# Patient Record
Sex: Female | Born: 1935 | Race: Black or African American | Hispanic: No | Smoking: Never smoker
Health system: Southern US, Community
[De-identification: ages and names within clinical notes are randomized; demographics above are authoritative.]

## PROBLEM LIST (undated history)

## (undated) DIAGNOSIS — F0280 Dementia in other diseases classified elsewhere without behavioral disturbance: Secondary | ICD-10-CM

## (undated) DIAGNOSIS — G309 Alzheimer's disease, unspecified: Secondary | ICD-10-CM

## (undated) DIAGNOSIS — D509 Iron deficiency anemia, unspecified: Secondary | ICD-10-CM

## (undated) DIAGNOSIS — N390 Urinary tract infection, site not specified: Secondary | ICD-10-CM

## (undated) DIAGNOSIS — I10 Essential (primary) hypertension: Secondary | ICD-10-CM

## (undated) DIAGNOSIS — K56609 Unspecified intestinal obstruction, unspecified as to partial versus complete obstruction: Secondary | ICD-10-CM

---

## 2007-11-23 ENCOUNTER — Ambulatory Visit: Payer: Self-pay | Admitting: Cardiovascular Disease

## 2007-11-23 ENCOUNTER — Inpatient Hospital Stay (HOSPITAL_COMMUNITY): Admission: EM | Admit: 2007-11-23 | Discharge: 2007-12-04 | Payer: Self-pay | Admitting: Emergency Medicine

## 2007-11-30 ENCOUNTER — Encounter: Payer: Self-pay | Admitting: Cardiovascular Disease

## 2008-01-07 ENCOUNTER — Inpatient Hospital Stay (HOSPITAL_COMMUNITY): Admission: EM | Admit: 2008-01-07 | Discharge: 2008-01-15 | Payer: Self-pay | Admitting: Emergency Medicine

## 2008-01-16 ENCOUNTER — Ambulatory Visit: Payer: Self-pay | Admitting: Family Medicine

## 2008-06-27 IMAGING — CR DG ABDOMEN ACUTE W/ 1V CHEST
3 series · 3 of 3 positions shown · non-contrast
Comparison: 11/23/2007.

CLINICAL DATA: Abdomen and chest pain.  Dehydration.  Altered
mental status.

ACUTE ABDOMEN SERIES (ABDOMEN 2 VIEW & CHEST 1 VIEW)

[view not recorded (1 of 3)]
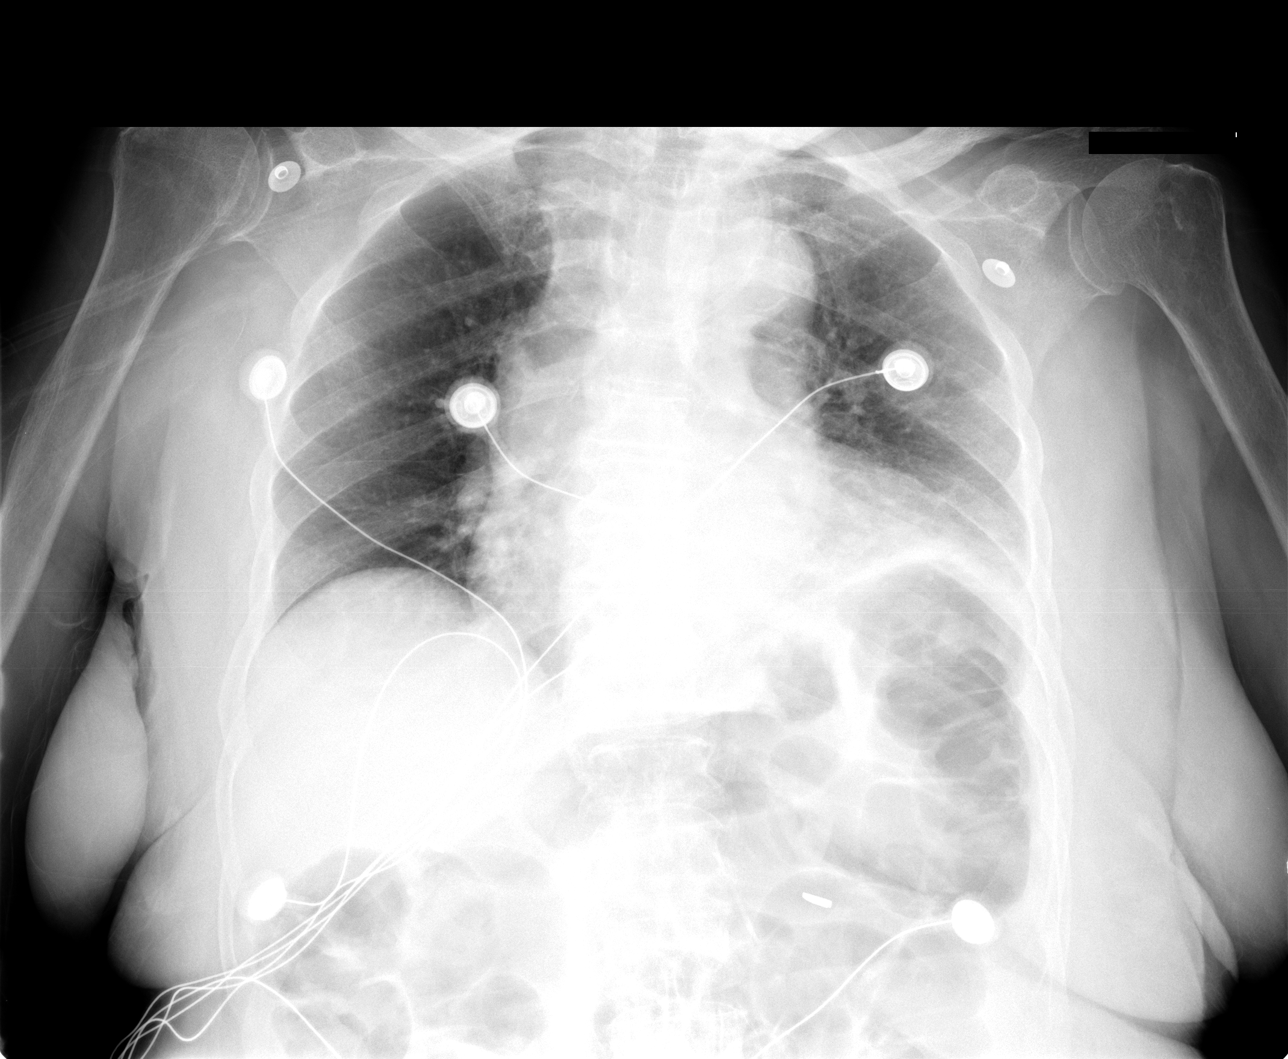

[view not recorded (2 of 3)]
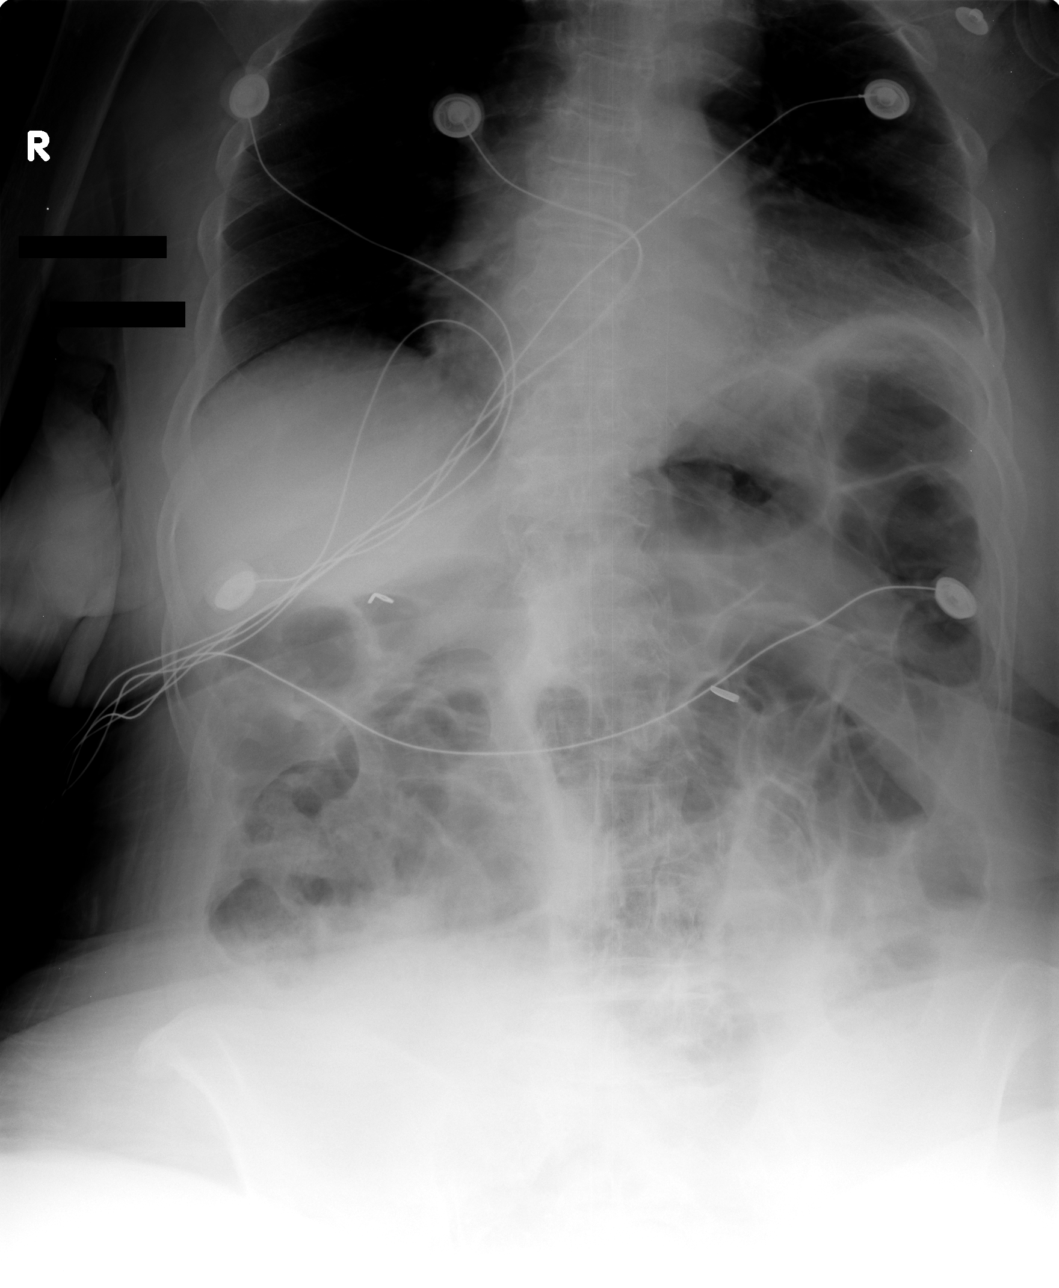

[view not recorded (3 of 3)]
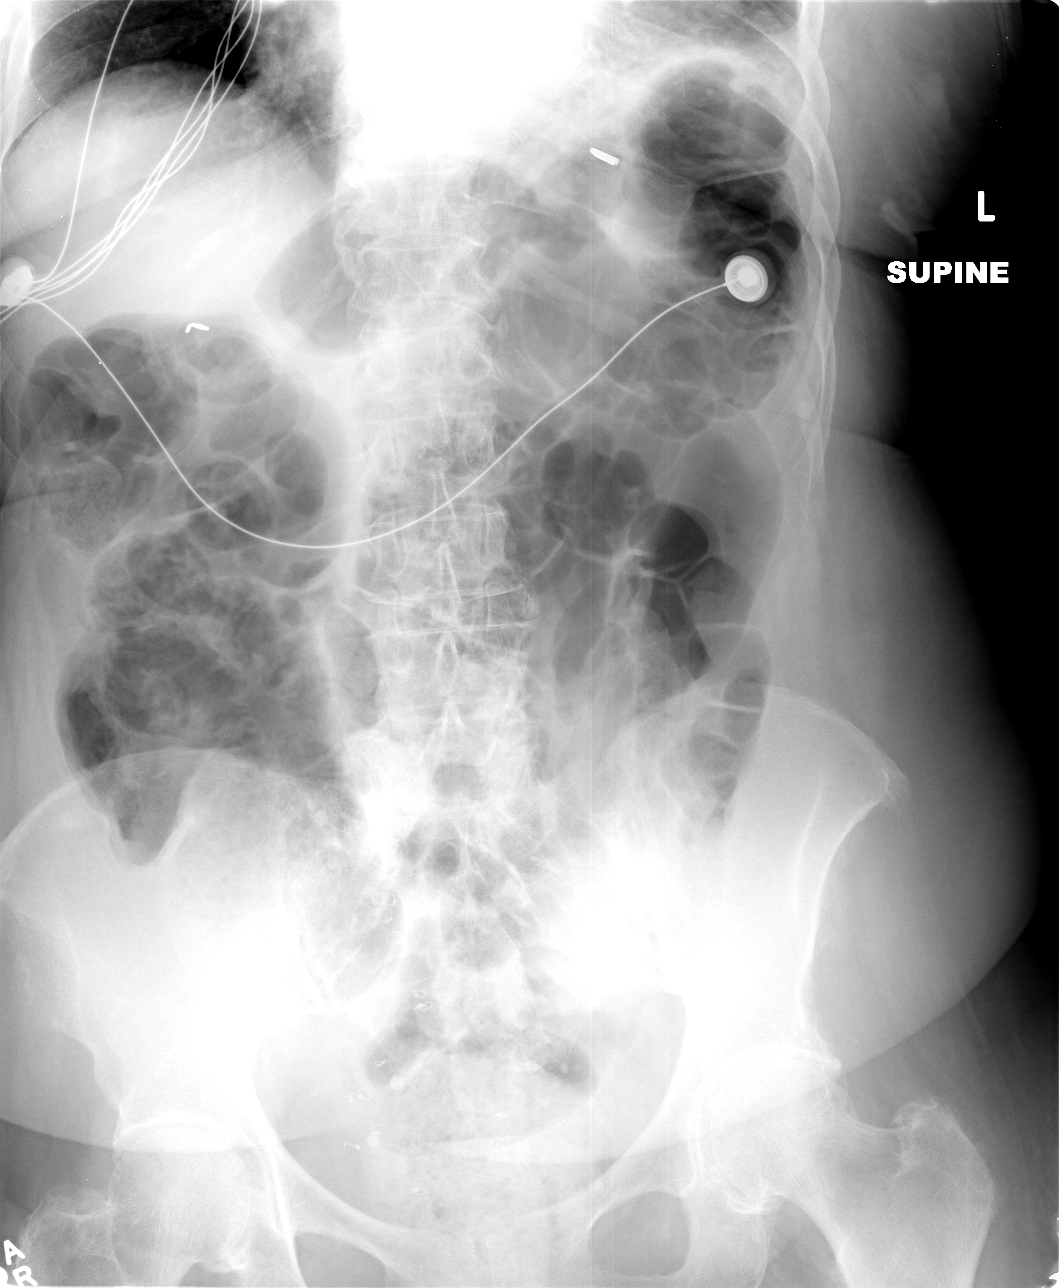

[3 of 3 positions shown; findings below may reference images not displayed]

FINDINGS: Very poor inspiration with mild left basilar airspace
opacity.  No gross change in borderline enlargement of the cardiac
silhouette and tortuosity of the aorta.  Normal bowel gas pattern
without free peritoneal air.  Interval mildly elevated left
hemidiaphragm.  Diffuse osteopenia.  Thoracolumbar spine
degenerative changes and mild scoliosis.  Bilateral upper abdominal
surgical clips and right pelvic surgical sutures.
IMPRESSION: 1.  Very poor inspiration with mild left basilar atelectasis.
2.  No acute abdominal abnormality.
3.  Stable borderline cardiomegaly.

## 2009-03-03 ENCOUNTER — Ambulatory Visit (HOSPITAL_BASED_OUTPATIENT_CLINIC_OR_DEPARTMENT_OTHER): Admission: RE | Admit: 2009-03-03 | Discharge: 2009-03-03 | Payer: Self-pay | Admitting: Internal Medicine

## 2009-03-03 ENCOUNTER — Ambulatory Visit: Payer: Self-pay | Admitting: Interventional Radiology

## 2010-07-28 ENCOUNTER — Inpatient Hospital Stay (HOSPITAL_COMMUNITY)
Admission: EM | Admit: 2010-07-28 | Discharge: 2010-08-09 | Disposition: A | Discharge status: Other Acute Inpatient Hospital | Payer: Self-pay | Source: Home / Self Care | Attending: Internal Medicine | Admitting: Internal Medicine

## 2010-07-30 ENCOUNTER — Encounter (INDEPENDENT_AMBULATORY_CARE_PROVIDER_SITE_OTHER): Payer: Self-pay | Admitting: Pulmonary Disease

## 2010-07-30 HISTORY — PX: OTHER SURGICAL HISTORY: SHX169

## 2010-08-02 LAB — POCT I-STAT 3, ART BLOOD GAS (G3+)
Acid-base deficit: 2 mmol/L (ref 0.0–2.0)
Acid-base deficit: 1 mmol/L (ref 0.0–2.0)
Bicarbonate: 21.8 mEq/L (ref 20.0–24.0)
Bicarbonate: 24.1 mEq/L — ABNORMAL HIGH (ref 20.0–24.0)
O2 Saturation: 99 %
O2 Saturation: 99 %
Patient temperature: 98.8
TCO2: 23 mmol/L (ref 0–100)
TCO2: 25 mmol/L (ref 0–100)
pCO2 arterial: 32.5 mmHg — ABNORMAL LOW (ref 35.0–45.0)
pCO2 arterial: 38.6 mmHg (ref 35.0–45.0)
pH, Arterial: 7.434 — ABNORMAL HIGH (ref 7.350–7.400)
pH, Arterial: 7.404 — ABNORMAL HIGH (ref 7.350–7.400)
pO2, Arterial: 154 mmHg — ABNORMAL HIGH (ref 80.0–100.0)
pO2, Arterial: 148 mmHg — ABNORMAL HIGH (ref 80.0–100.0)

## 2010-08-02 LAB — CBC
HCT: 46 % (ref 36.0–46.0)
HCT: 40.3 % (ref 36.0–46.0)
HCT: 37 % (ref 36.0–46.0)
HCT: 27.1 % — ABNORMAL LOW (ref 36.0–46.0)
HCT: 23.6 % — ABNORMAL LOW (ref 36.0–46.0)
HCT: 21.7 % — ABNORMAL LOW (ref 36.0–46.0)
HCT: 23.4 % — ABNORMAL LOW (ref 36.0–46.0)
HCT: 22.3 % — ABNORMAL LOW (ref 36.0–46.0)
Hemoglobin: 15.5 g/dL — ABNORMAL HIGH (ref 12.0–15.0)
Hemoglobin: 13.6 g/dL (ref 12.0–15.0)
Hemoglobin: 12.4 g/dL (ref 12.0–15.0)
Hemoglobin: 9.2 g/dL — ABNORMAL LOW (ref 12.0–15.0)
Hemoglobin: 7.9 g/dL — ABNORMAL LOW (ref 12.0–15.0)
Hemoglobin: 7.5 g/dL — ABNORMAL LOW (ref 12.0–15.0)
Hemoglobin: 8 g/dL — ABNORMAL LOW (ref 12.0–15.0)
Hemoglobin: 7.8 g/dL — ABNORMAL LOW (ref 12.0–15.0)
MCH: 27.4 pg (ref 26.0–34.0)
MCH: 27.3 pg (ref 26.0–34.0)
MCH: 26.9 pg (ref 26.0–34.0)
MCH: 26.8 pg (ref 26.0–34.0)
MCH: 26.8 pg (ref 26.0–34.0)
MCH: 28.4 pg (ref 26.0–34.0)
MCH: 28 pg (ref 26.0–34.0)
MCH: 28.4 pg (ref 26.0–34.0)
MCHC: 33.7 g/dL (ref 30.0–36.0)
MCHC: 33.7 g/dL (ref 30.0–36.0)
MCHC: 33.5 g/dL (ref 30.0–36.0)
MCHC: 33.9 g/dL (ref 30.0–36.0)
MCHC: 33.5 g/dL (ref 30.0–36.0)
MCHC: 34.6 g/dL (ref 30.0–36.0)
MCHC: 34.2 g/dL (ref 30.0–36.0)
MCHC: 35 g/dL (ref 30.0–36.0)
MCV: 81.4 fL (ref 78.0–100.0)
MCV: 80.8 fL (ref 78.0–100.0)
MCV: 80.3 fL (ref 78.0–100.0)
MCV: 79 fL (ref 78.0–100.0)
MCV: 80 fL (ref 78.0–100.0)
MCV: 82.2 fL (ref 78.0–100.0)
MCV: 81.8 fL (ref 78.0–100.0)
MCV: 81.1 fL (ref 78.0–100.0)
Platelets: 271 10*3/uL (ref 150–400)
Platelets: 200 10*3/uL (ref 150–400)
Platelets: 199 10*3/uL (ref 150–400)
Platelets: 176 10*3/uL (ref 150–400)
Platelets: 132 10*3/uL — ABNORMAL LOW (ref 150–400)
Platelets: 105 10*3/uL — ABNORMAL LOW (ref 150–400)
Platelets: 126 10*3/uL — ABNORMAL LOW (ref 150–400)
Platelets: 130 10*3/uL — ABNORMAL LOW (ref 150–400)
RBC: 5.65 MIL/uL — ABNORMAL HIGH (ref 3.87–5.11)
RBC: 4.99 MIL/uL (ref 3.87–5.11)
RBC: 4.61 MIL/uL (ref 3.87–5.11)
RBC: 3.43 MIL/uL — ABNORMAL LOW (ref 3.87–5.11)
RBC: 2.95 MIL/uL — ABNORMAL LOW (ref 3.87–5.11)
RBC: 2.64 MIL/uL — ABNORMAL LOW (ref 3.87–5.11)
RBC: 2.86 MIL/uL — ABNORMAL LOW (ref 3.87–5.11)
RBC: 2.75 MIL/uL — ABNORMAL LOW (ref 3.87–5.11)
RDW: 13.2 % (ref 11.5–15.5)
RDW: 13.5 % (ref 11.5–15.5)
RDW: 13.3 % (ref 11.5–15.5)
RDW: 13.1 % (ref 11.5–15.5)
RDW: 13.2 % (ref 11.5–15.5)
RDW: 13.6 % (ref 11.5–15.5)
RDW: 13.6 % (ref 11.5–15.5)
RDW: 14 % (ref 11.5–15.5)
WBC: 15.5 10*3/uL — ABNORMAL HIGH (ref 4.0–10.5)
WBC: 15.7 10*3/uL — ABNORMAL HIGH (ref 4.0–10.5)
WBC: 18.3 10*3/uL — ABNORMAL HIGH (ref 4.0–10.5)
WBC: 5.8 10*3/uL (ref 4.0–10.5)
WBC: 5.4 10*3/uL (ref 4.0–10.5)
WBC: 6.8 10*3/uL (ref 4.0–10.5)
WBC: 7.6 10*3/uL (ref 4.0–10.5)
WBC: 7.7 10*3/uL (ref 4.0–10.5)

## 2010-08-02 LAB — COMPREHENSIVE METABOLIC PANEL
ALT: 9 U/L (ref 0–35)
ALT: 9 U/L (ref 0–35)
ALT: 10 U/L (ref 0–35)
ALT: 13 U/L (ref 0–35)
AST: 24 U/L (ref 0–37)
AST: 26 U/L (ref 0–37)
AST: 26 U/L (ref 0–37)
AST: 28 U/L (ref 0–37)
Albumin: 2.1 g/dL — ABNORMAL LOW (ref 3.5–5.2)
Albumin: 2.3 g/dL — ABNORMAL LOW (ref 3.5–5.2)
Albumin: 2.1 g/dL — ABNORMAL LOW (ref 3.5–5.2)
Albumin: 2.6 g/dL — ABNORMAL LOW (ref 3.5–5.2)
Alkaline Phosphatase: 34 U/L — ABNORMAL LOW (ref 39–117)
Alkaline Phosphatase: 24 U/L — ABNORMAL LOW (ref 39–117)
Alkaline Phosphatase: 30 U/L — ABNORMAL LOW (ref 39–117)
Alkaline Phosphatase: 45 U/L (ref 39–117)
BUN: 36 mg/dL — ABNORMAL HIGH (ref 6–23)
BUN: 39 mg/dL — ABNORMAL HIGH (ref 6–23)
BUN: 38 mg/dL — ABNORMAL HIGH (ref 6–23)
BUN: 29 mg/dL — ABNORMAL HIGH (ref 6–23)
CO2: 20 mEq/L (ref 19–32)
CO2: 25 mEq/L (ref 19–32)
CO2: 25 mEq/L (ref 19–32)
CO2: 24 mEq/L (ref 19–32)
Calcium: 7.7 mg/dL — ABNORMAL LOW (ref 8.4–10.5)
Calcium: 7.4 mg/dL — ABNORMAL LOW (ref 8.4–10.5)
Calcium: 7.4 mg/dL — ABNORMAL LOW (ref 8.4–10.5)
Calcium: 7.7 mg/dL — ABNORMAL LOW (ref 8.4–10.5)
Chloride: 111 mEq/L (ref 96–112)
Chloride: 113 mEq/L — ABNORMAL HIGH (ref 96–112)
Chloride: 113 mEq/L — ABNORMAL HIGH (ref 96–112)
Chloride: 114 mEq/L — ABNORMAL HIGH (ref 96–112)
Creatinine, Ser: 1.81 mg/dL — ABNORMAL HIGH (ref 0.4–1.2)
Creatinine, Ser: 2 mg/dL — ABNORMAL HIGH (ref 0.4–1.2)
Creatinine, Ser: 2.09 mg/dL — ABNORMAL HIGH (ref 0.4–1.2)
Creatinine, Ser: 1.72 mg/dL — ABNORMAL HIGH (ref 0.4–1.2)
GFR calc Af Amer: 32 mL/min — ABNORMAL LOW (ref >60)
GFR calc Af Amer: 29 mL/min — ABNORMAL LOW (ref >60)
GFR calc Af Amer: 27 mL/min — ABNORMAL LOW (ref >60)
GFR calc Af Amer: 34 mL/min — ABNORMAL LOW (ref >60)
GFR calc non Af Amer: 27 mL/min — ABNORMAL LOW (ref >60)
GFR calc non Af Amer: 24 mL/min — ABNORMAL LOW (ref >60)
GFR calc non Af Amer: 23 mL/min — ABNORMAL LOW (ref >60)
GFR calc non Af Amer: 28 mL/min — ABNORMAL LOW (ref >60)
Glucose, Bld: 113 mg/dL — ABNORMAL HIGH (ref 70–99)
Glucose, Bld: 100 mg/dL — ABNORMAL HIGH (ref 70–99)
Glucose, Bld: 206 mg/dL — ABNORMAL HIGH (ref 70–99)
Glucose, Bld: 78 mg/dL (ref 70–99)
Potassium: 4 mEq/L (ref 3.5–5.1)
Potassium: 4.7 mEq/L (ref 3.5–5.1)
Potassium: 4.4 mEq/L (ref 3.5–5.1)
Potassium: 3.8 mEq/L (ref 3.5–5.1)
Sodium: 138 mEq/L (ref 135–145)
Sodium: 144 mEq/L (ref 135–145)
Sodium: 142 mEq/L (ref 135–145)
Sodium: 142 mEq/L (ref 135–145)
Total Bilirubin: 0.9 mg/dL (ref 0.3–1.2)
Total Bilirubin: 1.1 mg/dL (ref 0.3–1.2)
Total Bilirubin: 0.7 mg/dL (ref 0.3–1.2)
Total Bilirubin: 1.2 mg/dL (ref 0.3–1.2)
Total Protein: 3.9 g/dL — ABNORMAL LOW (ref 6.0–8.3)
Total Protein: 3.9 g/dL — ABNORMAL LOW (ref 6.0–8.3)
Total Protein: 3.6 g/dL — ABNORMAL LOW (ref 6.0–8.3)
Total Protein: 4.5 g/dL — ABNORMAL LOW (ref 6.0–8.3)

## 2010-08-02 LAB — DIFFERENTIAL
Basophils Absolute: 0 10*3/uL (ref 0.0–0.1)
Basophils Absolute: 0 10*3/uL (ref 0.0–0.1)
Basophils Relative: 0 % (ref 0–1)
Basophils Relative: 0 % (ref 0–1)
Eosinophils Absolute: 0 10*3/uL (ref 0.0–0.7)
Eosinophils Absolute: 0 10*3/uL (ref 0.0–0.7)
Eosinophils Relative: 0 % (ref 0–5)
Eosinophils Relative: 0 % (ref 0–5)
Lymphocytes Relative: 3 % — ABNORMAL LOW (ref 12–46)
Lymphocytes Relative: 4 % — ABNORMAL LOW (ref 12–46)
Lymphs Abs: 0.5 10*3/uL — ABNORMAL LOW (ref 0.7–4.0)
Lymphs Abs: 0.2 10*3/uL — ABNORMAL LOW (ref 0.7–4.0)
Monocytes Absolute: 1.6 10*3/uL — ABNORMAL HIGH (ref 0.1–1.0)
Monocytes Absolute: 0.5 10*3/uL (ref 0.1–1.0)
Monocytes Relative: 11 % (ref 3–12)
Monocytes Relative: 9 % (ref 3–12)
Neutro Abs: 13.4 10*3/uL — ABNORMAL HIGH (ref 1.7–7.7)
Neutro Abs: 4.7 10*3/uL (ref 1.7–7.7)
Neutrophils Relative %: 86 % — ABNORMAL HIGH (ref 43–77)
Neutrophils Relative %: 87 % — ABNORMAL HIGH (ref 43–77)
WBC Morphology: INCREASED

## 2010-08-02 LAB — CROSSMATCH
ABO/RH(D): O POS
Antibody Screen: NEGATIVE
Unit division: 0
Unit division: 0

## 2010-08-02 LAB — HEPATIC FUNCTION PANEL
ALT: 12 U/L (ref 0–35)
AST: 42 U/L — ABNORMAL HIGH (ref 0–37)
Albumin: 4 g/dL (ref 3.5–5.2)
Alkaline Phosphatase: 63 U/L (ref 39–117)
Bilirubin, Direct: 0.2 mg/dL (ref 0.0–0.3)
Indirect Bilirubin: 0.8 mg/dL (ref 0.3–0.9)
Total Bilirubin: 1 mg/dL (ref 0.3–1.2)
Total Protein: 8 g/dL (ref 6.0–8.3)

## 2010-08-02 LAB — GLUCOSE, CAPILLARY
Glucose-Capillary: 104 mg/dL — ABNORMAL HIGH (ref 70–99)
Glucose-Capillary: 106 mg/dL — ABNORMAL HIGH (ref 70–99)
Glucose-Capillary: 131 mg/dL — ABNORMAL HIGH (ref 70–99)
Glucose-Capillary: 155 mg/dL — ABNORMAL HIGH (ref 70–99)
Glucose-Capillary: 231 mg/dL — ABNORMAL HIGH (ref 70–99)
Glucose-Capillary: 90 mg/dL (ref 70–99)
Glucose-Capillary: 98 mg/dL (ref 70–99)
Glucose-Capillary: 100 mg/dL — ABNORMAL HIGH (ref 70–99)
Glucose-Capillary: 108 mg/dL — ABNORMAL HIGH (ref 70–99)
Glucose-Capillary: 108 mg/dL — ABNORMAL HIGH (ref 70–99)
Glucose-Capillary: 60 mg/dL — ABNORMAL LOW (ref 70–99)
Glucose-Capillary: 60 mg/dL — ABNORMAL LOW (ref 70–99)
Glucose-Capillary: 65 mg/dL — ABNORMAL LOW (ref 70–99)
Glucose-Capillary: 66 mg/dL — ABNORMAL LOW (ref 70–99)
Glucose-Capillary: 67 mg/dL — ABNORMAL LOW (ref 70–99)
Glucose-Capillary: 69 mg/dL — ABNORMAL LOW (ref 70–99)
Glucose-Capillary: 69 mg/dL — ABNORMAL LOW (ref 70–99)
Glucose-Capillary: 70 mg/dL (ref 70–99)
Glucose-Capillary: 74 mg/dL (ref 70–99)
Glucose-Capillary: 78 mg/dL (ref 70–99)
Glucose-Capillary: 86 mg/dL (ref 70–99)
Glucose-Capillary: 86 mg/dL (ref 70–99)
Glucose-Capillary: 88 mg/dL (ref 70–99)
Glucose-Capillary: 88 mg/dL (ref 70–99)
Glucose-Capillary: 88 mg/dL (ref 70–99)
Glucose-Capillary: 103 mg/dL — ABNORMAL HIGH (ref 70–99)
Glucose-Capillary: 64 mg/dL — ABNORMAL LOW (ref 70–99)
Glucose-Capillary: 80 mg/dL (ref 70–99)
Glucose-Capillary: 81 mg/dL (ref 70–99)
Glucose-Capillary: 82 mg/dL (ref 70–99)
Glucose-Capillary: 82 mg/dL (ref 70–99)
Glucose-Capillary: 84 mg/dL (ref 70–99)
Glucose-Capillary: 87 mg/dL (ref 70–99)

## 2010-08-02 LAB — PREPARE FRESH FROZEN PLASMA
Unit division: 0
Unit division: 0

## 2010-08-02 LAB — BLOOD GAS, ARTERIAL
Acid-base deficit: 0.4 mmol/L (ref 0.0–2.0)
Bicarbonate: 22.2 mEq/L (ref 20.0–24.0)
Drawn by: 246861
FIO2: 0.5 %
MECHVT: 500 mL
O2 Saturation: 99.4 %
PEEP: 5 cmH2O
Patient temperature: 98.6
RATE: 10 resp/min
TCO2: 23.1 mmol/L (ref 0–100)
pCO2 arterial: 27.3 mmHg — ABNORMAL LOW (ref 35.0–45.0)
pH, Arterial: 7.521 — ABNORMAL HIGH (ref 7.350–7.400)
pO2, Arterial: 127 mmHg — ABNORMAL HIGH (ref 80.0–100.0)

## 2010-08-02 LAB — BASIC METABOLIC PANEL
BUN: 27 mg/dL — ABNORMAL HIGH (ref 6–23)
BUN: 30 mg/dL — ABNORMAL HIGH (ref 6–23)
BUN: 39 mg/dL — ABNORMAL HIGH (ref 6–23)
CO2: 24 mEq/L (ref 19–32)
CO2: 29 mEq/L (ref 19–32)
CO2: 28 mEq/L (ref 19–32)
Calcium: 9.9 mg/dL (ref 8.4–10.5)
Calcium: 9 mg/dL (ref 8.4–10.5)
Calcium: 9.1 mg/dL (ref 8.4–10.5)
Chloride: 99 mEq/L (ref 96–112)
Chloride: 103 mEq/L (ref 96–112)
Chloride: 106 mEq/L (ref 96–112)
Creatinine, Ser: 1.55 mg/dL — ABNORMAL HIGH (ref 0.4–1.2)
Creatinine, Ser: 1.68 mg/dL — ABNORMAL HIGH (ref 0.4–1.2)
Creatinine, Ser: 1.8 mg/dL — ABNORMAL HIGH (ref 0.4–1.2)
GFR calc Af Amer: 39 mL/min — ABNORMAL LOW (ref >60)
GFR calc Af Amer: 35 mL/min — ABNORMAL LOW (ref >60)
GFR calc Af Amer: 33 mL/min — ABNORMAL LOW (ref >60)
GFR calc non Af Amer: 32 mL/min — ABNORMAL LOW (ref >60)
GFR calc non Af Amer: 29 mL/min — ABNORMAL LOW (ref >60)
GFR calc non Af Amer: 27 mL/min — ABNORMAL LOW (ref >60)
Glucose, Bld: 216 mg/dL — ABNORMAL HIGH (ref 70–99)
Glucose, Bld: 115 mg/dL — ABNORMAL HIGH (ref 70–99)
Glucose, Bld: 91 mg/dL (ref 70–99)
Potassium: 3.4 mEq/L — ABNORMAL LOW (ref 3.5–5.1)
Potassium: 4.6 mEq/L (ref 3.5–5.1)
Potassium: 4.6 mEq/L (ref 3.5–5.1)
Sodium: 140 mEq/L (ref 135–145)
Sodium: 141 mEq/L (ref 135–145)
Sodium: 143 mEq/L (ref 135–145)

## 2010-08-02 LAB — URINALYSIS, ROUTINE W REFLEX MICROSCOPIC
Bilirubin Urine: NEGATIVE
Ketones, ur: NEGATIVE mg/dL
Nitrite: NEGATIVE
Protein, ur: 100 mg/dL — AB
Specific Gravity, Urine: 1.016 (ref 1.005–1.030)
Urine Glucose, Fasting: NEGATIVE mg/dL
Urobilinogen, UA: 0.2 mg/dL (ref 0.0–1.0)
pH: 5.5 (ref 5.0–8.0)

## 2010-08-02 LAB — LACTIC ACID, PLASMA
Lactic Acid, Venous: 3.3 mmol/L — ABNORMAL HIGH (ref 0.5–2.2)
Lactic Acid, Venous: 5.9 mmol/L — ABNORMAL HIGH (ref 0.5–2.2)
Lactic Acid, Venous: 1.6 mmol/L (ref 0.5–2.2)

## 2010-08-02 LAB — HEMOGLOBIN AND HEMATOCRIT, BLOOD
HCT: 17.8 % — ABNORMAL LOW (ref 36.0–46.0)
Hemoglobin: 5.9 g/dL — CL (ref 12.0–15.0)

## 2010-08-02 LAB — ABO/RH: ABO/RH(D): O POS

## 2010-08-02 LAB — CULTURE, RESPIRATORY W GRAM STAIN

## 2010-08-02 LAB — URINE MICROSCOPIC-ADD ON

## 2010-08-02 LAB — PHOSPHORUS
Phosphorus: 2 mg/dL — ABNORMAL LOW (ref 2.3–4.6)
Phosphorus: 2.4 mg/dL (ref 2.3–4.6)

## 2010-08-02 LAB — T4, FREE: Free T4: 1.55 ng/dL (ref 0.80–1.80)

## 2010-08-02 LAB — PROTIME-INR
INR: 1.79 — ABNORMAL HIGH (ref 0.00–1.49)
INR: 1.31 (ref 0.00–1.49)
Prothrombin Time: 21 seconds — ABNORMAL HIGH (ref 11.6–15.2)
Prothrombin Time: 16.5 seconds — ABNORMAL HIGH (ref 11.6–15.2)

## 2010-08-02 LAB — MRSA PCR SCREENING: MRSA by PCR: NEGATIVE

## 2010-08-02 LAB — TSH: TSH: 4.968 u[IU]/mL — ABNORMAL HIGH (ref 0.350–4.500)

## 2010-08-02 LAB — MAGNESIUM
Magnesium: 1.9 mg/dL (ref 1.5–2.5)
Magnesium: 1.7 mg/dL (ref 1.5–2.5)

## 2010-08-02 LAB — APTT
aPTT: 52 seconds — ABNORMAL HIGH (ref 24–37)
aPTT: 46 seconds — ABNORMAL HIGH (ref 24–37)

## 2010-08-02 LAB — T3, FREE: T3, Free: 2.2 pg/mL — ABNORMAL LOW (ref 2.3–4.2)

## 2010-08-02 LAB — LIPASE, BLOOD: Lipase: 15 U/L (ref 11–59)

## 2010-08-04 LAB — CBC
HCT: 23.9 % — ABNORMAL LOW (ref 36.0–46.0)
HCT: 25.1 % — ABNORMAL LOW (ref 36.0–46.0)
Hemoglobin: 8.2 g/dL — ABNORMAL LOW (ref 12.0–15.0)
Hemoglobin: 8.7 g/dL — ABNORMAL LOW (ref 12.0–15.0)
MCH: 28 pg (ref 26.0–34.0)
MCH: 27.6 pg (ref 26.0–34.0)
MCHC: 34.3 g/dL (ref 30.0–36.0)
MCHC: 34.7 g/dL (ref 30.0–36.0)
MCV: 81.6 fL (ref 78.0–100.0)
MCV: 79.7 fL (ref 78.0–100.0)
Platelets: 154 10*3/uL (ref 150–400)
Platelets: 166 10*3/uL (ref 150–400)
RBC: 2.93 MIL/uL — ABNORMAL LOW (ref 3.87–5.11)
RBC: 3.15 MIL/uL — ABNORMAL LOW (ref 3.87–5.11)
RDW: 14.2 % (ref 11.5–15.5)
RDW: 14.1 % (ref 11.5–15.5)
WBC: 8.1 10*3/uL (ref 4.0–10.5)
WBC: 8.4 10*3/uL (ref 4.0–10.5)

## 2010-08-04 LAB — DIFFERENTIAL
Basophils Absolute: 0 10*3/uL (ref 0.0–0.1)
Basophils Relative: 0 % (ref 0–1)
Eosinophils Absolute: 0.1 10*3/uL (ref 0.0–0.7)
Eosinophils Relative: 2 % (ref 0–5)
Lymphocytes Relative: 4 % — ABNORMAL LOW (ref 12–46)
Lymphs Abs: 0.3 10*3/uL — ABNORMAL LOW (ref 0.7–4.0)
Monocytes Absolute: 0.9 10*3/uL (ref 0.1–1.0)
Monocytes Relative: 11 % (ref 3–12)
Neutro Abs: 7 10*3/uL (ref 1.7–7.7)
Neutrophils Relative %: 83 % — ABNORMAL HIGH (ref 43–77)

## 2010-08-04 LAB — POCT I-STAT 3, ART BLOOD GAS (G3+)
Acid-Base Excess: 3 mmol/L — ABNORMAL HIGH (ref 0.0–2.0)
Acid-Base Excess: 3 mmol/L — ABNORMAL HIGH (ref 0.0–2.0)
Acid-base deficit: 1 mmol/L (ref 0.0–2.0)
Bicarbonate: 22.7 mEq/L (ref 20.0–24.0)
Bicarbonate: 27.6 mEq/L — ABNORMAL HIGH (ref 20.0–24.0)
Bicarbonate: 26.8 meq/L — ABNORMAL HIGH (ref 20.0–24.0)
O2 Saturation: 99 %
O2 Saturation: 99 %
O2 Saturation: 98 %
Patient temperature: 100.3
Patient temperature: 100
Patient temperature: 99.2
TCO2: 24 mmol/L (ref 0–100)
TCO2: 29 mmol/L (ref 0–100)
TCO2: 28 mmol/L (ref 0–100)
pCO2 arterial: 33.2 mmHg — ABNORMAL LOW (ref 35.0–45.0)
pCO2 arterial: 40.6 mmHg (ref 35.0–45.0)
pCO2 arterial: 39.1 mmHg (ref 35.0–45.0)
pH, Arterial: 7.446 — ABNORMAL HIGH (ref 7.350–7.400)
pH, Arterial: 7.444 — ABNORMAL HIGH (ref 7.350–7.400)
pH, Arterial: 7.446 — ABNORMAL HIGH (ref 7.350–7.400)
pO2, Arterial: 131 mmHg — ABNORMAL HIGH (ref 80.0–100.0)
pO2, Arterial: 118 mmHg — ABNORMAL HIGH (ref 80.0–100.0)
pO2, Arterial: 95 mmHg (ref 80.0–100.0)

## 2010-08-04 LAB — COMPREHENSIVE METABOLIC PANEL
ALT: 14 U/L (ref 0–35)
ALT: 14 U/L (ref 0–35)
AST: 26 U/L (ref 0–37)
AST: 25 U/L (ref 0–37)
Albumin: 2.5 g/dL — ABNORMAL LOW (ref 3.5–5.2)
Albumin: 2.2 g/dL — ABNORMAL LOW (ref 3.5–5.2)
Alkaline Phosphatase: 46 U/L (ref 39–117)
Alkaline Phosphatase: 40 U/L (ref 39–117)
BUN: 22 mg/dL (ref 6–23)
BUN: 16 mg/dL (ref 6–23)
CO2: 25 mEq/L (ref 19–32)
CO2: 25 mEq/L (ref 19–32)
Calcium: 8.1 mg/dL — ABNORMAL LOW (ref 8.4–10.5)
Calcium: 8.2 mg/dL — ABNORMAL LOW (ref 8.4–10.5)
Chloride: 107 mEq/L (ref 96–112)
Chloride: 105 mEq/L (ref 96–112)
Creatinine, Ser: 1.58 mg/dL — ABNORMAL HIGH (ref 0.4–1.2)
Creatinine, Ser: 1.41 mg/dL — ABNORMAL HIGH (ref 0.4–1.2)
GFR calc Af Amer: 38 mL/min — ABNORMAL LOW (ref >60)
GFR calc Af Amer: 43 mL/min — ABNORMAL LOW (ref >60)
GFR calc non Af Amer: 31 mL/min — ABNORMAL LOW (ref >60)
GFR calc non Af Amer: 36 mL/min — ABNORMAL LOW (ref >60)
Glucose, Bld: 68 mg/dL — ABNORMAL LOW (ref 70–99)
Glucose, Bld: 126 mg/dL — ABNORMAL HIGH (ref 70–99)
Potassium: 3.3 mEq/L — ABNORMAL LOW (ref 3.5–5.1)
Potassium: 3.2 mEq/L — ABNORMAL LOW (ref 3.5–5.1)
Sodium: 139 mEq/L (ref 135–145)
Sodium: 136 mEq/L (ref 135–145)
Total Bilirubin: 1 mg/dL (ref 0.3–1.2)
Total Bilirubin: 0.7 mg/dL (ref 0.3–1.2)
Total Protein: 4.7 g/dL — ABNORMAL LOW (ref 6.0–8.3)
Total Protein: 4.5 g/dL — ABNORMAL LOW (ref 6.0–8.3)

## 2010-08-04 LAB — GLUCOSE, CAPILLARY
Glucose-Capillary: 118 mg/dL — ABNORMAL HIGH (ref 70–99)
Glucose-Capillary: 125 mg/dL — ABNORMAL HIGH (ref 70–99)
Glucose-Capillary: 62 mg/dL — ABNORMAL LOW (ref 70–99)
Glucose-Capillary: 63 mg/dL — ABNORMAL LOW (ref 70–99)
Glucose-Capillary: 67 mg/dL — ABNORMAL LOW (ref 70–99)
Glucose-Capillary: 81 mg/dL (ref 70–99)
Glucose-Capillary: 83 mg/dL (ref 70–99)
Glucose-Capillary: 90 mg/dL (ref 70–99)
Glucose-Capillary: 119 mg/dL — ABNORMAL HIGH (ref 70–99)
Glucose-Capillary: 126 mg/dL — ABNORMAL HIGH (ref 70–99)
Glucose-Capillary: 127 mg/dL — ABNORMAL HIGH (ref 70–99)
Glucose-Capillary: 138 mg/dL — ABNORMAL HIGH (ref 70–99)
Glucose-Capillary: 146 mg/dL — ABNORMAL HIGH (ref 70–99)
Glucose-Capillary: 149 mg/dL — ABNORMAL HIGH (ref 70–99)

## 2010-08-04 LAB — PROTIME-INR
INR: 1.32 (ref 0.00–1.49)
INR: 1.4 (ref 0.00–1.49)
Prothrombin Time: 16.6 seconds — ABNORMAL HIGH (ref 11.6–15.2)
Prothrombin Time: 17.4 seconds — ABNORMAL HIGH (ref 11.6–15.2)

## 2010-08-04 LAB — PHOSPHORUS
Phosphorus: 2.8 mg/dL (ref 2.3–4.6)
Phosphorus: 2.1 mg/dL — ABNORMAL LOW (ref 2.3–4.6)

## 2010-08-04 LAB — APTT
aPTT: 48 seconds — ABNORMAL HIGH (ref 24–37)
aPTT: 50 seconds — ABNORMAL HIGH (ref 24–37)

## 2010-08-04 LAB — TRIGLYCERIDES: Triglycerides: 30 mg/dL (ref <150)

## 2010-08-04 LAB — MAGNESIUM
Magnesium: 1.9 mg/dL (ref 1.5–2.5)
Magnesium: 1.5 mg/dL (ref 1.5–2.5)

## 2010-08-04 LAB — PREALBUMIN: Prealbumin: 9 mg/dL — ABNORMAL LOW (ref 17.0–34.0)

## 2010-08-04 LAB — CHOLESTEROL, TOTAL: Cholesterol: 49 mg/dL (ref 0–200)

## 2010-08-09 LAB — GLUCOSE, CAPILLARY
Glucose-Capillary: 108 mg/dL — ABNORMAL HIGH (ref 70–99)
Glucose-Capillary: 119 mg/dL — ABNORMAL HIGH (ref 70–99)
Glucose-Capillary: 122 mg/dL — ABNORMAL HIGH (ref 70–99)
Glucose-Capillary: 124 mg/dL — ABNORMAL HIGH (ref 70–99)
Glucose-Capillary: 127 mg/dL — ABNORMAL HIGH (ref 70–99)
Glucose-Capillary: 131 mg/dL — ABNORMAL HIGH (ref 70–99)
Glucose-Capillary: 113 mg/dL — ABNORMAL HIGH (ref 70–99)
Glucose-Capillary: 117 mg/dL — ABNORMAL HIGH (ref 70–99)
Glucose-Capillary: 119 mg/dL — ABNORMAL HIGH (ref 70–99)
Glucose-Capillary: 130 mg/dL — ABNORMAL HIGH (ref 70–99)
Glucose-Capillary: 115 mg/dL — ABNORMAL HIGH (ref 70–99)
Glucose-Capillary: 126 mg/dL — ABNORMAL HIGH (ref 70–99)

## 2010-08-09 LAB — BASIC METABOLIC PANEL
BUN: 16 mg/dL (ref 6–23)
CO2: 28 mEq/L (ref 19–32)
CO2: 26 mEq/L (ref 19–32)
Calcium: 8.2 mg/dL — ABNORMAL LOW (ref 8.4–10.5)
Calcium: 8.2 mg/dL — ABNORMAL LOW (ref 8.4–10.5)
Chloride: 105 mEq/L (ref 96–112)
Creatinine, Ser: 1.14 mg/dL (ref 0.4–1.2)
Creatinine, Ser: 0.95 mg/dL (ref 0.4–1.2)
GFR calc Af Amer: 55 mL/min — ABNORMAL LOW (ref >60)
GFR calc Af Amer: >60 mL/min (ref >60)
GFR calc non Af Amer: 46 mL/min — ABNORMAL LOW (ref >60)
Glucose, Bld: 122 mg/dL — ABNORMAL HIGH (ref 70–99)
Potassium: 4 mEq/L (ref 3.5–5.1)
Sodium: 139 mEq/L (ref 135–145)
Sodium: 136 mEq/L (ref 135–145)

## 2010-08-09 LAB — CULTURE, BLOOD (ROUTINE X 2)
Culture  Setup Time: 201201140144
Culture  Setup Time: 201201140608
Culture: NO GROWTH
Culture: NO GROWTH

## 2010-08-09 LAB — COMPREHENSIVE METABOLIC PANEL
ALT: 20 U/L (ref 0–35)
AST: 44 U/L — ABNORMAL HIGH (ref 0–37)
Albumin: 2.1 g/dL — ABNORMAL LOW (ref 3.5–5.2)
CO2: 28 mEq/L (ref 19–32)
Chloride: 101 mEq/L (ref 96–112)
Creatinine, Ser: 1.01 mg/dL (ref 0.4–1.2)
GFR calc Af Amer: >60 mL/min (ref >60)
GFR calc non Af Amer: 52 mL/min — ABNORMAL LOW (ref >60)
Potassium: 3.9 mEq/L (ref 3.5–5.1)
Sodium: 140 mEq/L (ref 135–145)
Total Bilirubin: 0.7 mg/dL (ref 0.3–1.2)

## 2010-08-09 LAB — MAGNESIUM
Magnesium: 1.9 mg/dL (ref 1.5–2.5)
Magnesium: 2.1 mg/dL (ref 1.5–2.5)

## 2010-08-09 LAB — CBC
HCT: 25.1 % — ABNORMAL LOW (ref 36.0–46.0)
Hemoglobin: 8.5 g/dL — ABNORMAL LOW (ref 12.0–15.0)
Hemoglobin: 8.1 g/dL — ABNORMAL LOW (ref 12.0–15.0)
Hemoglobin: 8.6 g/dL — ABNORMAL LOW (ref 12.0–15.0)
MCH: 27.3 pg (ref 26.0–34.0)
MCH: 27.9 pg (ref 26.0–34.0)
MCHC: 33.9 g/dL (ref 30.0–36.0)
MCHC: 34.3 g/dL (ref 30.0–36.0)
MCV: 80.7 fL (ref 78.0–100.0)
Platelets: 169 10*3/uL (ref 150–400)
Platelets: 184 10*3/uL (ref 150–400)
Platelets: 221 10*3/uL (ref 150–400)
RBC: 3.11 MIL/uL — ABNORMAL LOW (ref 3.87–5.11)
RBC: 2.97 MIL/uL — ABNORMAL LOW (ref 3.87–5.11)
RBC: 3.08 MIL/uL — ABNORMAL LOW (ref 3.87–5.11)
RDW: 14.5 % (ref 11.5–15.5)
WBC: 10 10*3/uL (ref 4.0–10.5)
WBC: 11 10*3/uL — ABNORMAL HIGH (ref 4.0–10.5)

## 2010-08-09 NOTE — Discharge Summary (Addendum)
Michelle Hooper, Michelle Hooper             ACCOUNT NO.:  [id number]  MEDICAL RECORD NO.:  [id number]          PATIENT TYPE:  INP  LOCATION:  5156                         FACILITY:  [facility]  PHYSICIAN:  Lonia Blood, M.D.       DATE OF BIRTH:  [birth year]  DATE OF ADMISSION:  07/28/2010 DATE OF DISCHARGE:  08/09/2010                        DISCHARGE SUMMARY - REFERRING   PRIMARY CARE PHYSICIAN:  Jackie Plum, M.D.  DISCHARGE DIAGNOSES: 1. Small-bowel obstruction with necrotic small bowel status post     exploratory laparotomy, lysis of adhesions, small-bowel resection     and On-Q pain pump placement by Dr. Donell Beers from [facility]     [facility]. 2. Sepsis due to the necrosed small bowel, resolved. 3. Dementia. 4. Hypertension. 5. History of recurrent urinary tract infections. 6. History of chronic anemia. 7. Acute renal failure due to the sepsis and ACE inhibitors, resolved. 8. Hypokalemia secondary to hydrochlorothiazide, resolved. 9. Postoperative ventilator-dependent respiratory failure.  The     patient managed in the intensive care unit by the critical care     physicians for a few days. 10.Urinary tract infection with coagulase-negative Staphylococcus     aureus. 11.Hypomagnesemia, resolved. 12.ICU delirium, resolved.  DISCHARGE MEDICATIONS: 1. Amlodipine 10 mg daily. 2. Omeprazole 20 mg daily. 3. Aspirin 81 mg daily. 4. Calcium one tablet daily. 5. Fish oil one capsule daily. 6. Stool softener a capsule every morning.  CONDITION ON DISCHARGE:  Michelle Hooper is discharged to skilled nursing facility where she will have physical therapy, occupational therapy follow her.  She has a mechanical soft regular diet aloud at this point in time.  The skilled nursing home must call the office of [facility] [phone] to establish a follow-up visit with Dr. Donell Beers in 1 week for postoperative evaluation.  PROCEDURE DURING THIS ADMISSION: 1. On July 28, 2010, the  patient had a CT scan of abdomen and     pelvis  with findings of high-grade small-bowel obstruction,     transition point in the superior pelvis. 2. On July 29, 2010, abdominal x-ray with findings of worsened     small-bowel obstruction. 3. On July 30, 2010, procedure was exploratory laparotomy with     lysis of adhesions, small-bowel resection, On-Q pain pump placement     by Dr. Donell Beers. 4. Intubation and mechanical ventilation from January 13 until August 03, 2010. 5. July 29, 2010, placement of PICC line. 6. January 13 to August 03, 2010, the patient had an arterial line.  CONSULTATION THIS ADMISSION:  The patient was seen by the critical care medicine and [facility].  HISTORY AND PHYSICAL:  Refer to dictated H and P done by Dr. Conley Canal.  HOSPITAL COURSE:  Michelle Hooper, an 77 year old woman with history of dementia was admitted from the emergency room on July 28, 2010 with high-grade small-bowel obstruction.  She was placed on nasogastric tube suctioning, bowel rest, IV fluids for her acute renal failure, SIRS and hypokalemia.  She developed also delirium and increased agitation during this period of time.  On July 29, 2010 the situation became more critical.  The  patient's leukocytosis was worsening, she was spiking fevers, showing clear signs of sepsis from this abdominal process.  Dr. Donell Beers from [facility] took the patient to the operating room on July 30, 2010 at 1:10 p.m. and she did an open exploratory laparotomy where she found adhesions, a loop of necrosed small bowel that was resected.  Postoperatively, the patient was intubated and transferred to the intensive care unit for ventilator dependent respiratory failure with this abdominal sepsis.  She was placed on empiric antibiotics from day one on ciprofloxacin and Flagyl and Invanz was added on January 13.  She was continued on intravenous antibiotics and she  had her normal intensive care unit course of treatment under the care of the Pulmonary Critical Care Medicine from January 13 to August 03, 2010.  The patient stabilized nicely hemodynamic and she did not require pressors.  She was continued on nasogastric tube suctioning and eventually she was extubated on August 03, 2010.  At that point in time, she was displaying signs of ICU delirium which cleared away slowly.  On January19, 2012 she was transferred to the regular floor where she has remained stable without any new complaints.  Her nasogastric tube was removed on August 06, 2010 and her diet was advanced all the way up to a soft mechanical diet on August 08, 2010. Today, August 09, 2010 the patient's vital signs are temperature 98.1, heart rate 87, respiratory rate 18, blood pressure 156/86 saturation 98% on room air.  She is calm and cooperative.  She is at her baseline dementia.  She needs assistance with feeding and she is stable to be transferred to skilled nursing home for rehabilitation.     Lonia Blood, M.D.     SL/MEDQ  D:  08/09/2010  T:  08/09/2010  Job:  [id number]  cc:   Jackie Plum, M.D. Almond Lint, MD  Electronically Signed by Lonia Blood M.D. on 08/09/2010 06:30:32 PM

## 2010-08-10 LAB — CBC
HCT: 24.2 % — ABNORMAL LOW (ref 36.0–46.0)
Hemoglobin: 8.1 g/dL — ABNORMAL LOW (ref 12.0–15.0)
Hemoglobin: 8.7 g/dL — ABNORMAL LOW (ref 12.0–15.0)
MCH: 27.1 pg (ref 26.0–34.0)
MCH: 27.2 pg (ref 26.0–34.0)
MCHC: 33.5 g/dL (ref 30.0–36.0)
MCHC: 33.6 g/dL (ref 30.0–36.0)
MCV: 80.9 fL (ref 78.0–100.0)
Platelets: 324 10*3/uL (ref 150–400)
RDW: 15.1 % (ref 11.5–15.5)
RDW: 15.2 % (ref 11.5–15.5)

## 2010-08-10 LAB — BASIC METABOLIC PANEL
BUN: 22 mg/dL (ref 6–23)
CO2: 27 mEq/L (ref 19–32)
CO2: 25 mEq/L (ref 19–32)
Calcium: 8.2 mg/dL — ABNORMAL LOW (ref 8.4–10.5)
Calcium: 8.6 mg/dL (ref 8.4–10.5)
Creatinine, Ser: 0.93 mg/dL (ref 0.4–1.2)
Creatinine, Ser: 1.05 mg/dL (ref 0.4–1.2)
GFR calc Af Amer: >60 mL/min (ref >60)
GFR calc non Af Amer: 58 mL/min — ABNORMAL LOW (ref >60)
GFR calc non Af Amer: 50 mL/min — ABNORMAL LOW (ref >60)
Glucose, Bld: 81 mg/dL (ref 70–99)

## 2010-08-12 LAB — URINE CULTURE
Colony Count: >=100000
Culture  Setup Time: 201201112119

## 2010-11-30 NOTE — Consult Note (Signed)
Michelle Hooper, Michelle Hooper             ACCOUNT NO.:  [id number]   MEDICAL RECORD NO.:  [id number]          PATIENT TYPE:  INP   LOCATION:  1433                         FACILITY:  [facility]   PHYSICIAN:  Noralyn Pick. Eden Emms, MD, FACCDATE OF BIRTH:  June 18, 1925   DATE OF CONSULTATION:  DATE OF DISCHARGE:                                 CONSULTATION   REFERRING PHYSICIAN:  [facility]   An 77 year old patient we are asked to consult on by [facility] for  nonsustained VT.   The patient is 77 years old with dementia.  She was admitted on the 8th  with anorexia, nausea, vomiting, diarrhea, and dehydration.  Patient has  no previous history of coronary artery disease.  She has not been having  chest pain.  There is no history of cardiomegaly or cardiomyopathy.   On her monitor, she has been having several episodes of what has been  described as nonsustained VT.  She has a wide complex regular  tachycardia, longest duration seems to be anywhere from 15 to 20 beats.   I cannot tell just looking at the strips whether it is flutter with a  bundle branch block or whether it is nonsustained VT.  In other case, it  has been totally asymptomatic.   Since being hospitalized, the patient has been treated with antibiotics,  presumed diagnosis of gastritis.  She has been hydrated.  She feels much  improved.   The patient initially presented with marked dehydration with a BUN of 34  and a creatinine of 4.87.  This has markedly improved with therapy such  that her BUN now is 8 and her creatinine is 1.28.   In talking to the patient, she is able to ambulate.  She has recently  moved in with her daughter; however, she does have Alzheimer's and the  remainder of her history is somewhat sketchy.   She clearly has not had any significant syncope.  She did feel some  dizziness and lightheadedness on presentation due to her severe  dehydration.   REVIEW OF SYSTEMS:  Otherwise negative,  In particular, she has  never  had an arrhythmia before.  She has not had any significant palpitations  and not had a previous cardiac workup.   PAST MEDICAL HISTORY:  Remarkable for:  1. Hypertension.  2. Previous UTI.  3. Alzheimer's disease.   FAMILY HISTORY:  Noncontributory.   She has had a previous C-section   The patient is apparently from [place].  She lives with her  daughter.  She does activities of daily living.  She does not smoke or  drink but is otherwise mostly housebound.   MEDICATIONS PRIOR TO ADMISSION:  Included:  1. Megace for appetite 40 mg a day.  2. Alendronate tablet.  3. Calcium.  4. Cipro 250 b.i.d.  5. Micardis 12.5 a day.  6. Amlodipine 10 a day.  7. Lisinopril 10 a day.   SHE HAS NO KNOWN ALLERGIES.   EXAM:  Remarkable for an elderly black female in no distress.  She is in  sinus rhythm at a rate of 80.  There  are no PVCs and no arrhythmias.  Her blood pressure is 151/91.  Respirations are 18.  Temperature is  still 99.5.  Room air sats are 98%.  HEENT:  Unremarkable.  Carotids are without bruit.  No lymphadenopathy,  thyromegaly, JVP elevation.  LUNGS:  Clear to diaphragmatic motion.  No wheezing.  S1-S2, normal  heart sounds.  PMI normal.  ABDOMEN:  Benign.  Bowel sounds positive.  No AAA.  No tenderness.  No  bruit.  No hepatosplenomegaly or hepatojugular reflux.  She has a Foley  catheter in place.  She has trace lower extremity edema.  NEURO:  Nonfocal.  SKIN:  Warm and dry.  No muscular weakness.   Her baseline EKG shows sinus rhythm with left anterior fascicular block  and LVH.   LAB WORK:  Remarkable for current hemoglobin of 31.2, potassium 3.7, BUN  8, creatinine 1.28.  There is no TSH or thyroid in the chart.   X-RAYS:  Remarkable for thickened small bowel loops consistent with  gastritis or colitis.  Renal ultrasound with no acute obstruction.  Normal kidney sizes.  Actually, the TSH is 2.38.  Initial hepatic panel  did not show elevated  enzymes.   IMPRESSION:  1. Nonsustained ventricular tachycardia.  Unfortunately, we do not      have any 12 leads of this.  It may be flutter with a bundle branch      block.  It is asymptomatic.  I would stop her Norvasc and switch      her over to Lopressor 25 b.i.d.  A 2D echocardiogram should be      checked prior to discharge to make sure she has normal left      ventricular function.  Replete potassium and magnesium.  Otherwise,      I do not think any workup is needed at this time since her      arrhythmia is asymptomatic.  2. Recent admission for dehydration and prerenal azotemia, improved      markedly  3. Question gastritis.  Continue metronidazole and hydration; p.o.      intake is improving.  4. History of Alzheimer's.  Currently she appears functional.  She is      not requiring Haldol at night.  Consider addition of Aricept.  5. Hypertension, hypotensive on admission to dehydration, blood      pressure now back up, can resume Micardis, switch amlodipine to      beta-blocker given nonsustained rapid arrhythmia.  I would not have      her on both lisinopril and Micardis.   Again if the patient's echo is normal, I do not think any further workup  would be necessary and I would substitute a beta-blocker for one of her  hypertensive medicines.      Noralyn Pick. Eden Emms, MD, [facility]  Electronically Signed     PCN/MEDQ  D:  11/30/2007  T:  11/30/2007  Job:  [id number]

## 2010-11-30 NOTE — Discharge Summary (Signed)
Michelle Hooper, Michelle Hooper             ACCOUNT NO.:  [id number]   MEDICAL RECORD NO.:  [id number]          PATIENT TYPE:  INP   LOCATION:  1433                         [facility]ACILITY:  [facility]   PHYSICIAN:  Michelene Gardener, MD    DATE OF BIRTH:  1925/02/20   DATE OF ADMISSION:  11/23/2007  DATE OF DISCHARGE:                               DISCHARGE SUMMARY   ADDENDUM:   DISCHARGE DIAGNOSES:  1. Ventricular tachycardia.  2. Gastroentritis  3. History mild urinary tract infection.  4. Dehydration.  5. Acute on chronic renal failure.  6. Alzheimer's dementia.  7. Hypotension that resolved.  8. Iron deficiency anemia.  9. h/o Hypertension.   DISCHARGE MEDICATIONS:  1. Lopressor 25 mg p.o. twice daily.  2. Megestrol 40 mg/mL two spoons once daily.  3. Alendronate once daily.  4. Os-Cal D 500 mg once daily.  5. Micardis 12.5 mg once daily.  6. Lisinopril 20 mg once daily.   CONSULTATIONS:  Cardiology consult by Noralyn Pick. Eden Emms, MD, [facility] done in  May 15.   PROCEDURES:  None.   FOLLOW-UP APPOINTMENTS:  Primary physician in 1-2 weeks.   RADIOLOGY STUDIES:  1. Abdominal x-ray on May 8 showed mild enteritis type.  2. Renal ultrasound on May 9 showed no acute abnormalities.   COURSE OF HOSPITALIZATION:  The course of hospitalization up to May 12  was dictated initially.  Since that time the patient had been doing fine  with no acute problem.  Her gastroenteritis has resolved totally.  The  patient continued on the same treatment with IV fluids.  Her diet was  advanced from liquid diet to soft diet and then to regular diet and the  patient tolerated very well.  Her antibiotics where continued where she  was continued on Cipro and Flagyl.  Now I think she already received  enough antibiotics.  She does not need more antibiotics to go home.   The patient developed ventricular tachycardia during her  hospitalization.  Initially she had 10 runs of V-tach and then she had  another 17 runs of  V-tach.  Cardiology consultation was done and her V-  tach was thought to be secondary to low magnesium and low potassium.  Those were supplemented.  The patient remained asymptomatic without  chest pain or any shortness of breath.  The patient's medications were  switched from Norvasc to Lopressor 25 mg twice daily.  On beta-blockers  the patient does not have recurrence of V-tach.   Today at the time of discharge the patient is pretty stable does not  have any symptoms.  The patient has been seen by PT and OT who commended  either skilled facility or home with home health.  Family preferred to  take the patient home with home health.  Home health PT at home and OT  has been arranged and the patient will be discharged home today on the  above-mentioned medications.   TOTAL ASSESSMENT TIME:  40 minutes.      Michelene Gardener, MD  Electronically Signed     NAE/MEDQ  D:  12/03/2007  T:  12/03/2007  Job:  [id number]

## 2010-11-30 NOTE — Discharge Summary (Signed)
NAMEMIDGE, MOMON             ACCOUNT NO.:  [id number]   MEDICAL RECORD NO.:  [id number]          PATIENT TYPE:  INP   LOCATION:  1508                         FACILITY:  [facility]   PHYSICIAN:  Mobolaji B. Bakare, M.D.DATE OF BIRTH:  1971/10/16   DATE OF ADMISSION:  01/07/2008  DATE OF DISCHARGE:  01/15/2008                               DISCHARGE SUMMARY   PRIMARY CARE PHYSICIAN:  Unassigned.   FINAL DIAGNOSES:  1. Aspiration pneumonia.  2. Oropharyngeal dysphagia.  3. Oral thrush.  4. Hypernatremia.  5. Acute renal failure, resolved.  6. Chronic microcytic anemia.  7. Hypertension.   SECONDARY DIAGNOSES:  1. Alzheimer's dementia.  2. Osteoporosis.  3. Iron-deficiency anemia.   PROCEDURES:  1. Chest x-ray done on January 09, 2008 shows developing left lower lobe      airspace disease worrisome for pneumonia, aspiration is another      consideration.  2. Abdominal x-ray done on January 07, 2008 showed very poor inspiration      with mild left basilar atelectasis, no acute abdominal abnormality.      Stable borderline cardiomegaly.  3. Followup abdominal x-ray done on January 10, 2008, there was no      evidence of bowel obstruction or significant constipation.  4. Swallowing evaluation done on January 11, 2008 showed moderate      oropharyngeal dysphagia without aspiration or penetration of any      consistency tested.  Recommendation was dysphagia I diet with thin      liquid.  Full supervision needed for feeding.   BRIEF HISTORY:  Please refer to the admission H&P for full details.  In  brief, Ms. Fulgham is an 77 year old African American female with  Alzheimer's dementia.  She presented to the emergency room with  weakness, altered mental status and dehydration.  She had acute renal  insufficiency with a BUN of 31 and creatinine of 2.3.  Baseline BUN and  creatinine as of May 2009 were 10 and 1.07 respectively.  The patient  was felt to be dehydrated.  She was admitted for  hydration and further  evaluation of weakness.   HOSPITAL COURSE:  1. Aspiration pneumonia.  The patient had fever and leukocytosis at      the time of admission.  Initial workup with urinalysis and chest x-      ray was not suggestive of any obvious infection.  The patient      continued to run low-grade fever.  She was therefore empirically      started on antibiotic for presumed aspiration pneumonia given her      physical examination.  A subsequent chest x-ray revealed left lower      lobe airspace disease.  She will complete antibiotics for a total      of 8 days.  She remains afebrile for the past 24 hours and white      cell count has normalized.  2. Acute renal failure secondary to dehydration, poor p.o. intake.      The patient was hydrated with IV fluid.  BUN and creatinine have      normalized.  At the time of discharge, BUN was 9 and creatinine of      0.85.  3. Oral thrush.  The patient was noted to have oral thrush during the      course of hospitalization.  She has been started on Diflucan and      she is improving.  4. Oropharyngeal dysphagia.  The patient has Alzheimer's dementia and      has functional dysphagia.  She underwent modified barium swallow      and result is as noted above.  Recommendation is dysphagia I diet      with thin liquid.  5. Hypernatremia.  The patient was admitted with a sodium of 142.      However during the course of hospitalization due to poor p.o.      intake, sodium got worse at 146.  She responded to increased free      water.  At the time of discharge, sodium has normalized to 137.  6. Hypertension.  This patient has history of hypertension.  She was      on Micardis 12.5 mg daily, lisinopril 20 mg p.o. daily, Lopressor      50 mg p.o. b.i.d. and heart rate was normal between 60-70.      Obviously Micardis and lisinopril were held in view of the acute      renal failure.  As renal failure improved with titrating lisinopril      for  effect, Micardis will be completely held.  Heart rate was in      the range of 60-70.  Hence, we could not up titrate Lopressor.  At      this time, she is on Lopressor 25 mg b.i.d. and lisinopril will be      increased to 20 mg b.i.d. at the time of discharge.  Blood pressure      remains fairly controlled at the time of discharge.  At the time of      discharge, blood pressure was 187/83.  7. Microcytic anemia.  The patient has history of iron-deficiency      anemia.  No further workup was required during this      hospitalization.  She is on iron supplement.  8. Right knee arthritis.  The patient complained of right knee pain      and she was treated empirically with Tylenol and oxycodone p.r.n.,      pain has resolved.  There was no evidence of gout.  Uric acid was      normal.  9. Deconditioned.  The patient needs 2 assistants to help with      ambulation.  It is felt she would require short-term nursing      facility placement for rehabilitation.  She is not ambulating  at      all and she was prior to hospitalization.  The patient will require      DVT prophylaxis until she can ambulate appropriately.   DISCHARGE MEDICATIONS:  1. Fosamax 35 mg p.o. every 7 days.  2. Augmentin 875 p.o. b.i.d. through January 17, 2008.  3. Colace 100 mg p.o. b.i.d., hold for diarrhea.  4. Lovenox 30 mg subcutaneous daily.  Please discontinue Lovenox when      the patient is ambulating.  5. Diflucan 100 mg p.o. daily through January 20, 2008.  6. Nu-Iron 150 mg p.o. daily.  7. Lisinopril 20 mg p.o. b.i.d.  8. Megace 40 mg p.o. daily.  9. Lopressor 50 mg p.o. b.i.d.  10.MiraLax 17 gm p.o. daily, hold for diarrhea.  11.Tylenol 650 mg p.o. q.4 h. p.r.n.  12.Vicodin 1-2 tablets p.o. q.4 h. p.r.n. pain.  13.Os-Cal D 500 mg p.o. daily.   CONDITION ON DISCHARGE:  Hemodynamically stable, afebrile.  She has  baseline dementia.   DISPOSITION:  To a skilled nursing facility.      Mobolaji B. Corky Downs,  M.D.  Electronically Signed     MBB/MEDQ  D:  01/15/2008  T:  01/15/2008  Job:  [id number]

## 2010-11-30 NOTE — H&P (Signed)
Michelle Hooper, Michelle Hooper             ACCOUNT NO.:  [id number]   MEDICAL RECORD NO.:  [id number]          PATIENT TYPE:  INP   LOCATION:  1508                         FACILITY:  [facility]   PHYSICIAN:  Sabino Donovan, MD        DATE OF BIRTH:  [birth year]   DATE OF ADMISSION:  01/07/2008  DATE OF DISCHARGE:                              HISTORY & PHYSICAL   CHIEF COMPLAINT:  Weakness, altered mental status, and dehydration.   HISTORY OF PRESENT ILLNESS:  An 77 year old African American female with  a history of Alzheimer disease, hypertension, UTI, iron deficiency  anemia, and decreased bone density, who was presented by aid with a  complaint of decrease in mental status and weakness over the last day or  so.  At the time of my interview, family is not present and then I tried  calling the contact number without any success.  However, the patient  reportedly has baseline dementia, but has been more lethargic at home,  has had decreased fluid intake, and has been dehydrated.  Otherwise,  during my interview with the patient, she is alert and oriented x2, and  answers questions appropriately.  Denies any complains of chest pain or  shortness of breath.  Denies any nausea, vomiting, diarrhea,  constipation, or any headache.  She does report taking plenty of fluids  including milk and lie down per house aid and that is not true.  No  other complains noted.   PAST MEDICAL HISTORY:  1. Alzheimer disease.  2. Hypertension.  3. UTI.  4. Iron deficiency anemia.  5. Decreased bone density.   PAST SURGICAL HISTORY:  C-section.   FAMILY HISTORY:  Noncontributory.   SOCIAL HISTORY:  Negative x3.   DRUG ALLERGIES:  No known drug allergies.   HOME MEDICATIONS:  1. Alendronate 70 mg p.o. daily.  2. Micardis 12.5 mg daily.  3. Lopressor 25 mg p.o. b.i.d.  4. Lisinopril 20 mg p.o. daily.  5. Os-Cal D 500 mg p.o. daily.  6. Megestrol 40 mg two teaspoons once daily.   REVIEW OF SYMPTOMS:   Unremarkable.   PHYSICAL EXAMINATION:  VITAL SIGNS:  Temperature 97.6, pulse 82,  respiratory rate 16, blood pressure 124/103, and O2 sat 95% on room air.  GENERAL:  She is in no acute distress, alert and oriented x2.  HEENT:  PERRLA.  EOMI, but she did have dry mucous membrane.  NECK:  No lymphadenopathy or thyromegaly.  No JVD.  CHEST:  Clear to auscultation bilaterally.  CARDIOVASCULAR:  Regular rate and rhythm.  No murmurs, rubs, or gallops.  ABDOMEN:  Soft, nontender, and nondistended.  Normoactive bowel sounds.  EXTREMITIES:  No clubbing, cyanosis.  She did have 1+ pitting edema.  NEUROLOGIC:  Focally intact.   LABORATORIES:  White count 15.8, H&H 11.3 and 34.9, and platelets  149,000.  Sodium 142, potassium 5.1, BUN 31, creatinine 2.3, glucose  105, and albumin 2.9.  UA showed specific gravity of 1.016, pH 5.5, and  urobilinogen 0.2.  EKG was not done.   IMAGING:  Acute abdominal series, no infiltrate or effusion and no acute  abdominal findings.   IMPRESSION:  An 77 year old African American female with a history of:  1. Alzheimer's disease.  2. Hypertension.  3. History of urinary tract infection and iron deficiency anemia, now      admitted for acute renal failure and dehydration.   PROBLEM:  1. Acute renal failure likely due to a decrease fluid intake at home.      We will give her IV fluids.  We will obtain blood cultures, and we      will follow response.  2. Leukocytosis.  The patient presented with white blood cell count of      15.8; however, she did not have any infection, does not have fever,      and there is no source of infection present.  We will send blood      culture and follow her white blood cell count.  We will refrain      from giving her any antibiotics at this time if the patient      develops fever or if her white count remain stable this could be      considered.  3. Alzheimer dementia, continue outpatient care.  4. Hypertension, continue  Micardis, lisinopril, and Lopressor.  5. Prophylaxis.  PPI and Lovenox.      Sabino Donovan, MD  Electronically Signed     MJ/MEDQ  D:  01/07/2008  T:  01/08/2008  Job:  [id number]

## 2010-11-30 NOTE — H&P (Signed)
Michelle Hooper, Michelle Hooper             ACCOUNT NO.:  [id number]   MEDICAL RECORD NO.:  [id number]          PATIENT TYPE:  INP   LOCATION:  1433                         FACILITY:  [facility]   PHYSICIAN:  Herbie Saxon, MDDATE OF BIRTH:  Jun 06, 1925   DATE OF ADMISSION:  11/23/2007  DATE OF DISCHARGE:                              HISTORY & PHYSICAL   PRIMARY CARE PHYSICIAN:  Unassigned.   PRESENTING COMPLAINTS:  Weakness, anorexia, nausea, vomiting, diarrhea  more than three days.   HISTORY OF PRESENTING COMPLAINT:  This is an 77 year old African  American female who was brought by her daughter, who is currently not  available.  Patient was brought to the [facility] because of  the symptom of weakness, anorexia, nausea, vomiting, dizziness, which  has been ongoing for three to five days.  Patient has a history of  Alzheimer dementia, so she is not very helpful with additional history,  and there is no family member available at present.  EMS chart and  emergency room physician's chart reviewed.  Patient also has a past  medical history of hypertension and urinary tract infection.  No fever  documented.  Patient is denying any hematuria, dysuria, no abdominal  pain per patient.  She just recently moved to [place].  No new skin  rash or joint swelling.  No facial puffiness.  No syncopal episodes  noted.  At presentation her blood pressure was severely low at 80/55.   PAST MEDICAL HISTORY:  As earlier stated:  1. Alzheimer disease.  2. Hypertension.  3. Urinary tract infection.   FAMILY HISTORY:  Not available.   PAST SURGICAL HISTORY:  Cesarean section.   SOCIAL HISTORY:  Lives with her daughter.  No documentation of alcohol,  tobacco or illicit drug abuse.   REVIEW OF SYSTEMS:  Not obtainable.   MEDICATIONS:  1. Megace 40 mg daily.  2. Alendronate 1one tablet weekly.  3. Calcium 500 plus vitamin D, 1 tablet daily.  4. Cipro 250 mg b.i.d.  5. Micardis 12.5 mg  daily.  6. Amlodipine 10 mg daily.  7. Lisinopril 20 mg daily.   ALLERGIES:  NO KNOWN DRUG ALLERGIES.   EXAMINATION:  She is an elderly lady, dehydrated, not in acute  respiratory distress.  Pale clinically.  Not jaundiced.  No cyanosis.  Temperature is 97.6, pulse 69, respiratory rate _________ , blood  pressure 80/55.  Pupils are equal, reactive to light and accommodation with visual  acuity.  She is confused.  Not well oriented.  Memory is poor.  She is  alert, no tremors.  NECK:  Supple.  CHEST:  Clinically clear.  HEART:  Sounds 1 and 2 regular.  ABDOMEN:  Benign.  Peripheral pulses are present.  No pedal edema.  Cranial nerves and sensory system could not be tested.  Power is 4+ in  all limbs.  There is no skin rash.  No joint swelling.  No  lymphadenopathy.   LABORATORY DATA:  Available labs show WBC of 8, hematocrit 33, platelet  count is 337.  Chemistries show a sodium of 136, potassium 4.8, chloride  107, bicarbonate  21, BUN 34, creatinine 4.8, glucose is 105.  Abdominal  x-ray shows dilated, slightly prominent gas filled small bowel loops  with slightly thickened folds.  Query mild enteritis.  Chest x-ray:  vessels contributing to right paratracheal opacity.  Tortuous aorta and  borderline cardiomegaly.  Urinalysis:  Negative.   ASSESSMENT:  Infectious diarrhea.  Rule out viral gastroenteritis.  Dehydration.  Severe hypotension.  Dementia.  PLAN  Patient is to be admitted to a telemetry bed.  Diet will be clear  liquids.  IV fluid normal saline 80 mLs an hour.  O2, 2-4 liters by  nasal cannula to keep saturations greater than 90.  Send a stool for  WBC, ova and parasites. Also send a stool culture.  Hemoccult x2.  Check  her coagulation parameters.  Obtain an EKG, blood culture, calcium and  phosphate levels.  Lovenox 30 mg subcu daily for deep vein thrombosis  prophylaxis and Protonix 20 mg IV daily to prevent stress ulcers.  Phenergan 6.25 mg IV every 8 hours  p.r.n.  DuoNeb 3 mL every 6 hours  p.r.n. for shortness of breath.  Tylenol 325 mg every 4 hours p.r.n. for  mild to moderate pain.  Oxycodone 2.5 mg every 4 hours p.r.n. for pain.  Start on IV Cipro 250 mg every 12 hours.  Flagyl 250 mg IV every 8 hours.  Hold her blood pressure medications for  now.  Continue Megace and calcium.  Check her chemistry, plan on liver  function test in the morning.  Clarify her code status and advanced  directive with daughter when she is available.  We will also get a  physical therapy and occupational therapy evaluation.      Herbie Saxon, MD  Electronically Signed     MIO/MEDQ  D:  11/23/2007  T:  11/23/2007  Job:  [id number]

## 2010-11-30 NOTE — Discharge Summary (Signed)
Michelle Hooper, Michelle Hooper             ACCOUNT NO.:  [id number]   MEDICAL RECORD NO.:  [id number]          PATIENT TYPE:  INP   LOCATION:  1433                         [facility]ILITY[facility]   PHYSICIAN:  Herbie Saxon, MDDATE OF BIRTH:  [birth year]   DATE OF ADMISSION:  11/23/2007  DATE OF DISCHARGE:                               DISCHARGE SUMMARY   INTERIM DISCHARGE SUMMARY:   DISCHARGE MEDICATIONS:  To be dictated on discharge.   RADIOLOGY:  The renal ultrasound of Nov 24, 2007:  Showed no acute renal  pathology.  The kidneys are slightly small, consistent with chronic  renal disease.  Abdominal x-ray of Nov 23, 2007:  Showed slightly  prominent gas fields.  Small bowel loops with slightly thickened folds.  Mild enteritis, diabetic changes cannot be excluded.   HISTORY OF PRESENT ILLNESS:  This is a 77 year old African American lady  who presented with a three-day history of weakness, anorexia, nausea,  vomiting and diarrhea.  She has a past medical of hypertension, urinary  tract infection and Alzheimer's disease.   On presentation she was hypotensive with a blood pressure of 80/50,  dehydrated.  Her BUN was 34, creatinine 4.8.  Initial Hemoccult was  positive and showed an anemia with a hematocrit of 26.   HOSPITAL COURSE:  The patient was started on gentle IV fluid hydration.  Her blood pressure medications were held.  A history of microcytic  anemia with an MCV of 77.  Fecal lactoferrin is negative.  The patient  was transfused 4 units of packed red blood cells on Nov 25, 2007, with  improvement in her anemia.  Hematocrit rose from 26 to 28.  There has  been no active GI bleed, no melena, hematemesis or hematochezia.  Her  acuterenal failure is resolved.   Her doctor is not available to discuss the possibility of an outpatient  endoscopy with at present.  The patient is much improved; however, she  will need a 24-hour followup and it is recommended that she be  discharged to  a nursing facility dementia  unit.   DISCHARGE DIAGNOSES:  1. Gastric enteritis.  2. History of mild urinary tract infection.  3. Dehydration, resolved.  4. Acute on chronic kidney disease.  5. Alzheimer's dementia.  6. Hypotension, resolved.  7. Iron deficiency anemia.  Rule out underlying pathology.  8. Hypertension.  The blood pressure medications are to be restarted.   DISCHARGE PHYSICAL EXAM:  GENERAL:  She is an elderly lady, in no acute  distress.  VITAL SIGNS:  Temperature 98 degrees, pulse 77, respirations 20, blood  pressure elevated at 170/71.  SKIN:  She is pale clinically, not jaundiced, not dehydrated.  NECK:  Supple.  CHEST:  Clinically clear.  HEART:  Sounds 1 and 2, a regular rhythm.  ABDOMEN:  Benign.  NEUROLOGIC/EXTREMITIES:  Alert, disoriented, confused pleasantly.  Power  is 5.  Peripheral pulses present.  No pedal edema.   LABORATORY DATA:  Hematocrit 28, BUN 11, creatinine 1.4.   NOTATION:  The amlodipine and Lisinopril have been restarted.  She is to  be on Coumadin 0.1 mg  p.o. q.8h. p.r.n.   DISPOSITION/PLAN:  After a discussion with the daughter, may consider a  GI evaluation of endoscopy, to rule out colon polyps or peptic ulcer  disease.  Continue SNF placement.  To be discharged to a nursing  facility in the next two to three days.  Endoscopy may be done as an  outpatient as desired by the daughter.      Herbie Saxon, MD  Electronically Signed     MIO/MEDQ  D:  11/27/2007  T:  11/27/2007  Job:  [id number]

## 2010-11-30 NOTE — Discharge Summary (Signed)
Michelle Hooper, BASHOR             ACCOUNT NO.:  [id number]   MEDICAL RECORD NO.:  [id number]          PATIENT TYPE:  INP   LOCATION:  1433                         [facility]ACILITY[facility]   PHYSICIAN:  Michelene Gardener, MD    DATE OF BIRTH:  1924-08-02   DATE OF ADMISSION:  11/23/2007  DATE OF DISCHARGE:  12/04/2007                               DISCHARGE SUMMARY   ADDENDUM:  To the discharge summary which was initially done on Dec 03, 2007.   ADDENDUM TO THE COURSE OF HOSPITALIZATION:  This patient was supposed to  leave on Dec 03, 2007 but she became lethargic, was not able to take her  medications, was leaning to the right side and her blood pressure had  been uncontrolled around 180 systolic.  Discharge was held.  A stat CT  scan of the head was done and it showed no evidence of acute  intracranial abnormalities.  Urinalysis was done and showed no evidence  of UTI.  Basic metabolic panel was done and her electrolytes were  normal.  Hemoglobin also was done and it has remained stable.  The  patient was observed for another 24 hours.  Today at the time of  discharge the patient is totally back to her baseline.  She is awake and  can answer questions well.  She is off oxygen and saturating very good.  Vital signs are stable with last blood pressure is 137/72.  The patient  will be discharged today as stated before, will have PT and OT at home.   TOTAL TIME:  40 minutes.      Michelene Gardener, MD  Electronically Signed     NAE/MEDQ  D:  12/04/2007  T:  12/04/2007  Job:  [id number]

## 2011-04-13 LAB — HEMOGLOBIN AND HEMATOCRIT, BLOOD
HCT: 29.4 — ABNORMAL LOW
HCT: 29.7 — ABNORMAL LOW
Hemoglobin: 10 — ABNORMAL LOW
Hemoglobin: 10.5 — ABNORMAL LOW
Hemoglobin: 9.5 — ABNORMAL LOW
Hemoglobin: 9.6 — ABNORMAL LOW

## 2011-04-13 LAB — BASIC METABOLIC PANEL
BUN: 10
BUN: 6
BUN: 8
CO2: 19
CO2: 21
Calcium: 8.8
Chloride: 111
Chloride: 111
Creatinine, Ser: 1.07
Creatinine, Ser: 1.2
Creatinine, Ser: 1.28 — ABNORMAL HIGH
Glucose, Bld: 119 — ABNORMAL HIGH
Glucose, Bld: 88
Potassium: 4

## 2011-04-13 LAB — URINE MICROSCOPIC-ADD ON

## 2011-04-13 LAB — URINALYSIS, ROUTINE W REFLEX MICROSCOPIC
Bilirubin Urine: NEGATIVE
Glucose, UA: NEGATIVE
Specific Gravity, Urine: 1.011
Urobilinogen, UA: 0.2
pH: 5.5

## 2011-04-13 LAB — CBC
HCT: 32 — ABNORMAL LOW
MCHC: 33.7
MCV: 77.8 — ABNORMAL LOW
Platelets: 221
RDW: 16.7 — ABNORMAL HIGH

## 2011-04-13 LAB — CARDIAC PANEL(CRET KIN+CKTOT+MB+TROPI)
Relative Index: INVALID
Total CK: 52

## 2011-04-13 LAB — POTASSIUM: Potassium: 4.1

## 2011-04-13 LAB — MAGNESIUM: Magnesium: 1.6

## 2011-04-14 LAB — PHOSPHORUS: Phosphorus: 3.5

## 2011-04-14 LAB — CBC
HCT: 25.6 — ABNORMAL LOW
HCT: 26.4 — ABNORMAL LOW
HCT: 34.9 — ABNORMAL LOW
Hemoglobin: 11.3 — ABNORMAL LOW
Hemoglobin: 8.7 — ABNORMAL LOW
MCHC: 32.3
MCHC: 32.8
MCV: 77.3 — ABNORMAL LOW
MCV: 78.9
Platelets: 149 — ABNORMAL LOW
Platelets: 186
Platelets: 227
RBC: 3.42 — ABNORMAL LOW
RBC: 3.75 — ABNORMAL LOW
RBC: 4.42
RDW: 14.4
RDW: 15.7 — ABNORMAL HIGH
RDW: 15.8 — ABNORMAL HIGH
RDW: 15.9 — ABNORMAL HIGH
WBC: 10.2
WBC: 15.8 — ABNORMAL HIGH

## 2011-04-14 LAB — BASIC METABOLIC PANEL
BUN: 14
BUN: 26 — ABNORMAL HIGH
BUN: 30 — ABNORMAL HIGH
CO2: 20
CO2: 22
Calcium: 8.1 — ABNORMAL LOW
Calcium: 8.7
Chloride: 112
Chloride: 116 — ABNORMAL HIGH
Chloride: 119 — ABNORMAL HIGH
Creatinine, Ser: 0.85
Creatinine, Ser: 1.35 — ABNORMAL HIGH
Creatinine, Ser: 1.47 — ABNORMAL HIGH
GFR calc Af Amer: 41 — ABNORMAL LOW
GFR calc Af Amer: >60
GFR calc non Af Amer: 24 — ABNORMAL LOW
GFR calc non Af Amer: 38 — ABNORMAL LOW
Glucose, Bld: 106 — ABNORMAL HIGH
Glucose, Bld: 83
Glucose, Bld: 91
Potassium: 3.7
Potassium: 3.9
Potassium: 4.3
Potassium: 4.5
Sodium: 142
Sodium: 145
Sodium: 146 — ABNORMAL HIGH

## 2011-04-14 LAB — DIFFERENTIAL
Basophils Absolute: 0
Basophils Absolute: 0
Basophils Absolute: 0.1
Basophils Relative: 0
Basophils Relative: 1
Eosinophils Absolute: 0
Eosinophils Absolute: 0.3
Eosinophils Relative: 0
Eosinophils Relative: 1
Eosinophils Relative: 3
Lymphocytes Relative: 5 — ABNORMAL LOW
Lymphocytes Relative: 8 — ABNORMAL LOW
Lymphs Abs: 0.7
Monocytes Absolute: 1.2 — ABNORMAL HIGH
Monocytes Absolute: 1.5 — ABNORMAL HIGH
Monocytes Relative: 10
Monocytes Relative: 11
Neutro Abs: 13.5 — ABNORMAL HIGH
Neutrophils Relative %: 85 — ABNORMAL HIGH

## 2011-04-14 LAB — URINALYSIS, ROUTINE W REFLEX MICROSCOPIC
Bilirubin Urine: NEGATIVE
Glucose, UA: NEGATIVE
Hgb urine dipstick: NEGATIVE
Ketones, ur: NEGATIVE
Nitrite: NEGATIVE
Protein, ur: NEGATIVE
Specific Gravity, Urine: 1.016
Urobilinogen, UA: 0.2
pH: 5.5

## 2011-04-14 LAB — URINE CULTURE
Colony Count: NO GROWTH
Culture: NO GROWTH

## 2011-04-14 LAB — COMPREHENSIVE METABOLIC PANEL WITH GFR
ALT: 15
Alkaline Phosphatase: 92
BUN: 31 — ABNORMAL HIGH
CO2: 23
GFR calc non Af Amer: 20 — ABNORMAL LOW
Glucose, Bld: 105 — ABNORMAL HIGH
Potassium: 5.1
Total Protein: 7.6

## 2011-04-14 LAB — CULTURE, BLOOD (ROUTINE X 2)
Culture: NO GROWTH
Culture: NO GROWTH

## 2011-04-14 LAB — LIPASE, BLOOD: Lipase: 13

## 2011-04-14 LAB — COMPREHENSIVE METABOLIC PANEL
AST: 30
Albumin: 2.9 — ABNORMAL LOW
Calcium: 9.2
Chloride: 106
Creatinine, Ser: 2.3 — ABNORMAL HIGH
GFR calc Af Amer: 25 — ABNORMAL LOW
Sodium: 142
Total Bilirubin: 1.1

## 2011-04-14 LAB — BLOOD GAS, ARTERIAL
Bicarbonate: 20
O2 Saturation: 99.2
Patient temperature: 99.1
TCO2: 18.4
pH, Arterial: 7.458 — ABNORMAL HIGH

## 2011-04-14 LAB — TSH: TSH: 2.669

## 2013-09-17 ENCOUNTER — Emergency Department (HOSPITAL_COMMUNITY): Payer: Medicare Other

## 2013-09-17 ENCOUNTER — Inpatient Hospital Stay (HOSPITAL_COMMUNITY)
Admission: AD | Admit: 2013-09-17 | Discharge: 2013-09-23 | DRG: 304 | Disposition: A | Discharge status: Home / Self Care | Payer: Medicare Other | Attending: Internal Medicine | Admitting: Internal Medicine

## 2013-09-17 ENCOUNTER — Encounter (HOSPITAL_COMMUNITY): Payer: Self-pay | Admitting: Emergency Medicine

## 2013-09-17 DIAGNOSIS — M79605 Pain in left leg: Secondary | ICD-10-CM | POA: Diagnosis present

## 2013-09-17 DIAGNOSIS — R159 Full incontinence of feces: Secondary | ICD-10-CM | POA: Diagnosis present

## 2013-09-17 DIAGNOSIS — R32 Unspecified urinary incontinence: Secondary | ICD-10-CM | POA: Diagnosis present

## 2013-09-17 DIAGNOSIS — M79609 Pain in unspecified limb: Secondary | ICD-10-CM | POA: Diagnosis present

## 2013-09-17 DIAGNOSIS — N390 Urinary tract infection, site not specified: Secondary | ICD-10-CM | POA: Diagnosis present

## 2013-09-17 DIAGNOSIS — R69 Illness, unspecified: Secondary | ICD-10-CM

## 2013-09-17 DIAGNOSIS — Z7409 Other reduced mobility: Secondary | ICD-10-CM

## 2013-09-17 DIAGNOSIS — N179 Acute kidney failure, unspecified: Secondary | ICD-10-CM | POA: Diagnosis present

## 2013-09-17 DIAGNOSIS — IMO0002 Reserved for concepts with insufficient information to code with codable children: Secondary | ICD-10-CM

## 2013-09-17 DIAGNOSIS — I160 Hypertensive urgency: Secondary | ICD-10-CM | POA: Diagnosis present

## 2013-09-17 DIAGNOSIS — Z87440 Personal history of urinary (tract) infections: Secondary | ICD-10-CM

## 2013-09-17 DIAGNOSIS — E43 Unspecified severe protein-calorie malnutrition: Secondary | ICD-10-CM | POA: Diagnosis present

## 2013-09-17 DIAGNOSIS — B961 Klebsiella pneumoniae [K. pneumoniae] as the cause of diseases classified elsewhere: Secondary | ICD-10-CM | POA: Diagnosis present

## 2013-09-17 DIAGNOSIS — G309 Alzheimer's disease, unspecified: Secondary | ICD-10-CM | POA: Diagnosis present

## 2013-09-17 DIAGNOSIS — Z833 Family history of diabetes mellitus: Secondary | ICD-10-CM

## 2013-09-17 DIAGNOSIS — R532 Functional quadriplegia: Secondary | ICD-10-CM | POA: Diagnosis present

## 2013-09-17 DIAGNOSIS — F0280 Dementia in other diseases classified elsewhere without behavioral disturbance: Secondary | ICD-10-CM | POA: Diagnosis present

## 2013-09-17 DIAGNOSIS — I10 Essential (primary) hypertension: Principal | ICD-10-CM | POA: Diagnosis present

## 2013-09-17 DIAGNOSIS — E440 Moderate protein-calorie malnutrition: Secondary | ICD-10-CM

## 2013-09-17 DIAGNOSIS — R627 Adult failure to thrive: Secondary | ICD-10-CM | POA: Diagnosis present

## 2013-09-17 DIAGNOSIS — Z823 Family history of stroke: Secondary | ICD-10-CM

## 2013-09-17 DIAGNOSIS — Z66 Do not resuscitate: Secondary | ICD-10-CM | POA: Diagnosis present

## 2013-09-17 HISTORY — DX: Iron deficiency anemia, unspecified: D50.9

## 2013-09-17 HISTORY — DX: Urinary tract infection, site not specified: N39.0

## 2013-09-17 HISTORY — DX: Dementia in other diseases classified elsewhere, unspecified severity, without behavioral disturbance, psychotic disturbance, mood disturbance, and anxiety: F02.80

## 2013-09-17 HISTORY — DX: Essential (primary) hypertension: I10

## 2013-09-17 HISTORY — DX: Unspecified intestinal obstruction, unspecified as to partial versus complete obstruction: K56.609

## 2013-09-17 HISTORY — DX: Alzheimer's disease, unspecified: G30.9

## 2013-09-17 LAB — URINE MICROSCOPIC-ADD ON

## 2013-09-17 LAB — URINALYSIS, ROUTINE W REFLEX MICROSCOPIC
Bilirubin Urine: NEGATIVE
GLUCOSE, UA: NEGATIVE mg/dL
Hgb urine dipstick: NEGATIVE
KETONES UR: NEGATIVE mg/dL
NITRITE: POSITIVE — AB
PH: 6 (ref 5.0–8.0)
Protein, ur: NEGATIVE mg/dL
Specific Gravity, Urine: 1.014 (ref 1.005–1.030)
Urobilinogen, UA: 1 mg/dL (ref 0.0–1.0)

## 2013-09-17 LAB — COMPREHENSIVE METABOLIC PANEL
ALBUMIN: 3.1 g/dL — AB (ref 3.5–5.2)
ALT: 10 U/L (ref 0–35)
AST: 24 U/L (ref 0–37)
Alkaline Phosphatase: 71 U/L (ref 39–117)
BILIRUBIN TOTAL: 0.3 mg/dL (ref 0.3–1.2)
BUN: 16 mg/dL (ref 6–23)
CALCIUM: 9.2 mg/dL (ref 8.4–10.5)
CHLORIDE: 102 meq/L (ref 96–112)
CO2: 25 mEq/L (ref 19–32)
CREATININE: 1.11 mg/dL — AB (ref 0.50–1.10)
GFR calc Af Amer: 50 mL/min — ABNORMAL LOW (ref >90)
GFR calc non Af Amer: 43 mL/min — ABNORMAL LOW (ref >90)
Glucose, Bld: 96 mg/dL (ref 70–99)
Potassium: 4.4 mEq/L (ref 3.7–5.3)
Sodium: 140 mEq/L (ref 137–147)
TOTAL PROTEIN: 6.5 g/dL (ref 6.0–8.3)

## 2013-09-17 LAB — MRSA PCR SCREENING: MRSA by PCR: NEGATIVE

## 2013-09-17 LAB — CBC WITH DIFFERENTIAL/PLATELET
BASOS ABS: 0 10*3/uL (ref 0.0–0.1)
BASOS PCT: 1 % (ref 0–1)
Eosinophils Absolute: 0.2 10*3/uL (ref 0.0–0.7)
Eosinophils Relative: 3 % (ref 0–5)
HEMATOCRIT: 36.6 % (ref 36.0–46.0)
HEMOGLOBIN: 12.2 g/dL (ref 12.0–15.0)
LYMPHS PCT: 16 % (ref 12–46)
Lymphs Abs: 0.7 10*3/uL (ref 0.7–4.0)
MCH: 26.3 pg (ref 26.0–34.0)
MCHC: 33.3 g/dL (ref 30.0–36.0)
MCV: 79 fL (ref 78.0–100.0)
MONOS PCT: 14 % — AB (ref 3–12)
Monocytes Absolute: 0.6 10*3/uL (ref 0.1–1.0)
NEUTROS ABS: 2.9 10*3/uL (ref 1.7–7.7)
NEUTROS PCT: 66 % (ref 43–77)
Platelets: 149 10*3/uL — ABNORMAL LOW (ref 150–400)
RBC: 4.63 MIL/uL (ref 3.87–5.11)
RDW: 13.7 % (ref 11.5–15.5)
WBC: 4.4 10*3/uL (ref 4.0–10.5)

## 2013-09-17 LAB — TROPONIN I

## 2013-09-17 MED ORDER — SODIUM CHLORIDE 0.9 % IJ SOLN
3.0000 mL | Freq: Two times a day (BID) | INTRAMUSCULAR | Status: DC
  Administered 2013-09-17 – 2013-09-20 (×5): 3 mL via INTRAVENOUS

## 2013-09-17 MED ORDER — AMLODIPINE BESYLATE 10 MG PO TABS
10.0000 mg | ORAL_TABLET | Freq: Every day | ORAL | Status: DC
  Filled 2013-09-17: qty 1

## 2013-09-17 MED ORDER — POLYETHYLENE GLYCOL 3350 17 G PO PACK
17.0000 g | PACK | Freq: Every day | ORAL | Status: DC
  Administered 2013-09-17 – 2013-09-23 (×4): 17 g via ORAL
  Filled 2013-09-17 (×7): qty 1

## 2013-09-17 MED ORDER — HYDRALAZINE HCL 20 MG/ML IJ SOLN
10.0000 mg | Freq: Once | INTRAMUSCULAR | Status: AC
  Administered 2013-09-17: 10 mg via INTRAVENOUS
  Filled 2013-09-17 (×2): qty 0.5

## 2013-09-17 MED ORDER — DEXTROSE 5 % IV SOLN
0.5000 mg/min | INTRAVENOUS | Status: DC
  Administered 2013-09-17: 0.5 mg/min via INTRAVENOUS
  Administered 2013-09-17: 10 mg via INTRAVENOUS
  Filled 2013-09-17: qty 100

## 2013-09-17 MED ORDER — SODIUM CHLORIDE 0.9 % IV SOLN
INTRAVENOUS | Status: AC
  Administered 2013-09-17: 18:00:00 via INTRAVENOUS

## 2013-09-17 MED ORDER — ACETAMINOPHEN 325 MG PO TABS: 650.0000 mg | ORAL_TABLET | Freq: Four times a day (QID) | ORAL | Status: DC | PRN

## 2013-09-17 MED ORDER — ONDANSETRON HCL 4 MG PO TABS: 4.0000 mg | ORAL_TABLET | Freq: Four times a day (QID) | ORAL | Status: DC | PRN

## 2013-09-17 MED ORDER — SODIUM CHLORIDE 0.9 % IV SOLN
INTRAVENOUS | Status: DC
  Administered 2013-09-17 – 2013-09-18 (×2): via INTRAVENOUS

## 2013-09-17 MED ORDER — SODIUM CHLORIDE 0.9 % IV BOLUS (SEPSIS)
500.0000 mL | Freq: Once | INTRAVENOUS | Status: AC
  Administered 2013-09-17: 500 mL via INTRAVENOUS

## 2013-09-17 MED ORDER — DEXTROSE 5 % IV SOLN
1.0000 g | Freq: Once | INTRAVENOUS | Status: AC
  Administered 2013-09-17: 1 g via INTRAVENOUS
  Filled 2013-09-17: qty 10

## 2013-09-17 MED ORDER — LABETALOL HCL 5 MG/ML IV SOLN
20.0000 mg | Freq: Once | INTRAVENOUS | Status: AC
  Administered 2013-09-17: 20 mg via INTRAVENOUS
  Filled 2013-09-17: qty 4

## 2013-09-17 MED ORDER — ENOXAPARIN SODIUM 40 MG/0.4ML ~~LOC~~ SOLN
40.0000 mg | SUBCUTANEOUS | Status: DC
  Administered 2013-09-17 – 2013-09-22 (×6): 40 mg via SUBCUTANEOUS
  Filled 2013-09-17 (×7): qty 0.4

## 2013-09-17 MED ORDER — HYDRALAZINE HCL 25 MG PO TABS
25.0000 mg | ORAL_TABLET | Freq: Three times a day (TID) | ORAL | Status: DC
  Administered 2013-09-17 – 2013-09-18 (×4): 25 mg via ORAL
  Filled 2013-09-17 (×9): qty 1

## 2013-09-17 MED ORDER — DEXTROSE 5 % IV SOLN
1.0000 g | INTRAVENOUS | Status: DC
  Administered 2013-09-18 – 2013-09-22 (×5): 1 g via INTRAVENOUS
  Filled 2013-09-17 (×7): qty 10

## 2013-09-17 MED ORDER — AMLODIPINE BESYLATE 10 MG PO TABS
10.0000 mg | ORAL_TABLET | Freq: Every day | ORAL | Status: DC
  Administered 2013-09-17 – 2013-09-23 (×7): 10 mg via ORAL
  Filled 2013-09-17 (×8): qty 1

## 2013-09-17 MED ORDER — ONDANSETRON HCL 4 MG/2ML IJ SOLN: 4.0000 mg | Freq: Four times a day (QID) | INTRAMUSCULAR | Status: DC | PRN

## 2013-09-17 MED ORDER — ACETAMINOPHEN 650 MG RE SUPP: 650.0000 mg | Freq: Four times a day (QID) | RECTAL | Status: DC | PRN

## 2013-09-17 MED ORDER — LABETALOL HCL 5 MG/ML IV SOLN
20.0000 mg | INTRAVENOUS | Status: DC | PRN
  Administered 2013-09-19: 20 mg via INTRAVENOUS
  Filled 2013-09-17 (×2): qty 4

## 2013-09-17 MED ORDER — FLEET ENEMA 7-19 GM/118ML RE ENEM
1.0000 | ENEMA | Freq: Once | RECTAL | Status: AC
  Administered 2013-09-17: 1 via RECTAL
  Filled 2013-09-17: qty 1

## 2013-09-17 NOTE — H&P (Signed)
History and Physical  Michelle Hooper ZOX:[id number] DOB: 1925-03-13 DOA: 09/17/2013  Referring physician: Dr. Manus Gunning PCP: Jackie Plum, MD   Chief Complaint: Left thigh and hip pain   History of Present Illness: Michelle Hooper is an 80 y.o. female with a PMH of dementia, HTN (recently taken off anti-hypertensives), who was brought to the hospital via EMS for evaluation of LLE pain that began 2 days ago.  The patient lives with her daughter, Michelle Hooper, who provides most of the history.  Michelle Hooper reports that her mother has been non-ambulatory for the past few months, prior to this she ambulated with a walker, but developed fear of falling and subsequently refused to ambulate.  She reports that over the last week, the patient has had diminished oral intake.  Denies excessive daytime sleepiness, but notes that she is more confused than normal.  The patient is incontinent of stool and urine at baseline, last BM 2 days ago according to daughter.    Review of Systems: Constitutional: No fever, no chills;  Appetite diminished; + weight loss, no weight gain, no fatigue.  HEENT: No blurry vision, no diplopia, no pharyngitis, no dysphagia CV: No chest pain, no palpitations, no PND.  Resp: No SOB, no cough, no pleuritic pain. GI: No nausea, no vomiting, no diarrhea, no melena, no hematochezia, no constipation.  GU: No dysuria, no hematuria, no frequency, no urgency. MSK: no myalgias, no arthralgias.  Neuro:  No headache, no focal neurological deficits, no history of seizures.  Psych: No depression, no anxiety.  Endo: No heat intolerance, no cold intolerance, no polyuria, no polydipsia  Skin: No rashes, no skin lesions.  Heme: No easy bruising.    Past Medical History Past Medical History  Diagnosis Date  . SBO (small bowel obstruction)   . Hypertension   . Alzheimer's dementia   . Iron deficiency anemia   . Frequent UTI      Past Surgical History Past Surgical History  Procedure Laterality  Date  . Exploratory laparotomy, lysis of adhesions, small bowel  07/30/10     Social History: History   Social History  . Marital Status: Widowed    Spouse Name: N/A    Number of Children: 5  . Years of Education: N/A   Occupational History  . Retired Librarian, academic    Social History Main Topics  . Smoking status: Never Smoker   . Smokeless tobacco: Never Used  . Alcohol Use: No  . Drug Use: Not on file  . Sexual Activity: No   Other Topics Concern  . Not on file   Social History Narrative   Widowed.  Lives with daughter.  Non-ambulatory x several months.    Family History:  Family History  Problem Relation Age of Onset  . Cancer Son     Colon cancer  . Diabetes Son   . Diabetes Sister   . Hypertension Sister   . Stroke Sister     Allergies: Review of patient's allergies indicates no known allergies.  Meds: Prior to Admission medications   Medication Sig Start Date End Date Taking? Authorizing Provider  Acetaminophen (TYLENOL CHILDRENS PO) Take 15 mLs by mouth daily as needed (pain).   Yes Historical Provider, MD  Multiple Vitamin (MULTI VITAMIN DAILY PO) Take 15 mLs by mouth daily.   Yes Historical Provider, MD    Physical Exam: Filed Vitals:   09/17/13 1114 09/17/13 1249  BP: 223/105 265/110  Pulse: 88 88  Temp:  98.7 F (37.1 C)   TempSrc: Rectal Oral  Resp: 16 20  SpO2: 99% 100%     Physical Exam: Blood pressure 265/110, pulse 88, temperature 98.7 F (37.1 C), temperature source Rectal, resp. rate 20, SpO2 100.00%. Gen: No acute distress. Head: Normocephalic, atraumatic. Eyes: PERRL, EOMI, sclerae nonicteric. Mouth: Oropharynx clear. Patient is edentulous. Neck: Supple, no thyromegaly, no lymphadenopathy, no jugular venous distention. Chest: Lungs diminished in the bases but clear throughout. CV: Heart sounds are regular. No murmurs, rubs, or gallops. Abdomen: Soft, nontender, nondistended with  normal active bowel sounds. Extremities: Extremities are with trace edema. No clubbing or cyanosis. Skin: Warm and dry. Neuro: Alert but completely disoriented and relatively nonverbal; cranial nerves II through XII grossly intact. Psych: Mood and affect normal.  Labs on Admission:  Basic Metabolic Panel:  Recent Labs Lab 09/17/13 1134  NA 140  K 4.4  CL 102  CO2 25  GLUCOSE 96  BUN 16  CREATININE 1.11*  CALCIUM 9.2   Liver Function Tests:  Recent Labs Lab 09/17/13 1134  AST 24  ALT 10  ALKPHOS 71  BILITOT 0.3  PROT 6.5  ALBUMIN 3.1*   CBC:  Recent Labs Lab 09/17/13 1233  WBC 4.4  NEUTROABS 2.9  HGB 12.2  HCT 36.6  MCV 79.0  PLT 149*   Cardiac Enzymes:  Recent Labs Lab 09/17/13 1134  TROPONINI <0.30   Radiological Exams on Admission: Dg Chest 2 View  09/17/2013   CLINICAL DATA:  Dementia  EXAM: CHEST  2 VIEW  COMPARISON:  DG CHEST 1V PORT dated 08/02/2010; DG ABDOMEN 1V dated 07/29/2010; DG CHEST 1V PORT dated 07/31/2010; DG CHEST 1V PORT dated 08/01/2010; DG CHEST 2 VIEW dated 01/09/2008  FINDINGS: Stable enlarged cardiac silhouette. There is left lower lobe opacity likely representing atelectasis. No effusion or pulmonary edema. No pneumothorax. There is a a density in the medial right lung apex which is similar to prior and likely represents a vascular structure.  IMPRESSION: 1. Left lower lobe atelectasis. 2. No acute cardiopulmonary findings.   Electronically Signed   By: Genevive BiStewart  Edmunds M.D.   On: 09/17/2013 12:43   Dg Pelvis 1-2 Views  09/17/2013   CLINICAL DATA:  80 year old female with lower extremity pain and hypertension. Weakness. Initial encounter.  EXAM: PELVIS - 1-2 VIEW  COMPARISON:  CT Abdomen and Pelvis 07/28/2010.  FINDINGS: Osteopenia. Oblique. Retained stool in the distal colon. Aortoiliac calcified atherosclerosis noted. Postoperative changes with suture in the midline lower abdomen. Bony pelvis appears stable and intact. Grossly intact  proximal femurs.  IMPRESSION: No acute fracture or dislocation identified about the pelvis.   Electronically Signed   By: Augusto GambleLee  Hall M.D.   On: 09/17/2013 12:27   Dg Femur Left  09/17/2013   CLINICAL DATA:  80 year old female with lower extremity pain and weakness. Initial encounter.  EXAM: LEFT FEMUR - 2 VIEW  COMPARISON:  Pelvis radiograph from the same day reported separately.  FINDINGS: Osteopenia. Left femoral head normally located. Visible left hemipelvis appears intact. Proximal left femur intact. Left femoral shaft intact. Distal left femur appears intact with advanced degenerative spurring of the femoral condyles and patellofemoral compartment. There may be associated joint space loss at the left knee.  IMPRESSION: No acute fracture or dislocation identified about the left femur. If occult hip fracture is suspected or if the patient is unable to weightbear, MRI is the preferred modality for further evaluation.   Electronically Signed   By: Si GaulLee  Hall M.D.  On: 09/17/2013 12:30   Dg Femur Right  09/17/2013   CLINICAL DATA:  80 year old female with pain and weakness. Initial encounter.  EXAM: RIGHT FEMUR - 2 VIEW  COMPARISON:  Pelvis radiograph from the same day reported separately.  FINDINGS: Osteopenia. Right femoral head normally located. Proximal right femur intact. Right femoral shaft and distal right femur intact. Alignment at the right knee appears preserved. Visible right pelvis appears intact. Aortoiliac calcified atherosclerosis noted.  IMPRESSION: No acute fracture or dislocation identified about the right femur. If occult hip fracture is suspected or if the patient is unable to weightbear, MRI is the preferred modality for further evaluation.   Electronically Signed   By: Augusto Gamble M.D.   On: 09/17/2013 12:29   Ct Head Wo Contrast  09/17/2013   CLINICAL DATA:  Dementia, hypertension, left upper thigh pain.  EXAM: CT HEAD WITHOUT CONTRAST  TECHNIQUE: Contiguous axial images were obtained from  the base of the skull through the vertex without intravenous contrast.  COMPARISON:  CT scan of Dec 03, 2007.  FINDINGS: Diffuse cortical atrophy is noted. Ventricular size is within normal limits given the degree of atrophy present. No mass effect or midline shift is noted. There is no evidence of mass lesion, hemorrhage or acute infarction. No definite abnormality seen involving bony calvarium. These images are somewhat limited due to patient motion artifact.  IMPRESSION: Diffuse cortical atrophy.  No acute intracranial abnormality seen.   Electronically Signed   By: Roque Lias M.D.   On: 09/17/2013 12:42   Ct Pelvis Wo Contrast  09/17/2013   CLINICAL DATA:  Left upper thigh pain.  EXAM: CT PELVIS WITHOUT CONTRAST  TECHNIQUE: Multidetector CT imaging of the pelvis was performed following the standard protocol without intravenous contrast.  COMPARISON:  Single view of the pelvis 09/17/2013 at 11:46 a.m. CT abdomen and pelvis 07/28/2010.  FINDINGS: The hips are located. No acute fracture is identified. Remote healed fracture through the periphery of the left sacral ala is noted. No hip joint effusion is seen. There is fluid in the trochanteric bursae bilaterally compatible with bursitis. Pelvic musculature appears intact. Imaged lower lumbar spine demonstrates facet degenerative disease at L4-5 and L5-S1 with ankylosis of the facet joints noted. Imaged intrapelvic contents demonstrate a very large volume of stool in the rectosigmoid colon. Extensive aortoiliac atherosclerosis is present.  IMPRESSION: Negative for acute bony or joint abnormality. A remote left sacral ala fracture is seen.  Bilateral trochanteric bursitis.  Very large stool ball in the rectosigmoid colon compatible with fecal impaction.   Electronically Signed   By: Drusilla Kanner M.D.   On: 09/17/2013 13:49    EKG: Ordered, not done yet.  Assessment/Plan Principal Problem:   Hypertensive urgency We will admit the patient to telemetry  and use Norvasc, hydralazine, and when necessary labetalol to lower blood pressure. Patient has mild acute renal insufficiency but no other evidence of endorgan damage. Followup 12-lead EKG which I have ordered. Active Problems:   UTI (lower urinary tract infection) Continue Rocephin. Followup urine cultures.   Alzheimer's dementia The patient has advanced dementia. Supportive care. PT/OT evaluations.   Left leg pain Multiple radiographs of left leg and CT of the pelvis negative for occult fracture. The patient does have some skin erythema over her left hip bone, likely from pressure. Air mattress overlay for bed requested.   Severe protein-calorie malnutrition Dietitian consultation requested. Patient has had a poor appetite one week or more and has been losing weight.  Immobility PT/OT evaluations requested.   Acute kidney injury Likely prerenal, from poor oral intake. Gently hydrate.   DVT prophylaxis Lovenox ordered.  Code Status: DNR, confirmed with daughter. Family Communication: Haynes Hoehn (daughter): [phone] (home) [phone] (work) is POA, updated at bedside. Disposition Plan: Home when stable.  Time spent: One hour.  Addison Freimuth [facility] Pager [phone] Cell: [phone]   If 7PM-7AM, please contact night-coverage [email or web address] Password [email or web address] 09/17/2013, 3:30 PM    **Disclaimer: This note was dictated with voice recognition software. Similar sounding words can inadvertently be transcribed and this note may contain transcription errors which may not have been corrected upon publication of note.**

## 2013-09-17 NOTE — Progress Notes (Signed)
Fleets enema given but pt unable to hold in. Pt checked for impaction and a small amount of soft brown stool removed. Stool felt in rectum but unable to retrieve more at this time. Will recheck at a later time.

## 2013-09-17 NOTE — Progress Notes (Signed)
Utilization Review completed.  Timera Windt RN CM  

## 2013-09-17 NOTE — ED Notes (Signed)
Per EMS pt sent from Dr Greggory StallionGeorge for c/o left upper thigh pain and htn.

## 2013-09-17 NOTE — ED Provider Notes (Signed)
CSN: [id number]     Arrival date & time 09/17/13  1104 History   First MD Initiated Contact with Patient 09/17/13 1114     Chief Complaint  Patient presents with  . Leg Pain  . Hypertension     (Consider location/radiation/quality/duration/timing/severity/associated sxs/prior Treatment) HPI Comments: Patient has dementia and is essentially nonverbal. She was sent by her PCP for elevated blood pressure and leg pain. Daughter states patient has seem to have left thigh and hip pain for the past 3 days and is unable to walk with a walker which she normally does. She does not recall any falls. The patient has been acting more confused than usual. Her blood pressure has been elevated in the daughter states she was taken off blood pressure medicines several months ago because her doctor thought they were no longer needed. Patient is unable to give a history. She denies any chest pain, abdominal pain or back pain.  The history is provided by the EMS personnel, the patient and a relative. The history is limited by the condition of the patient.    Past Medical History  Diagnosis Date  . SBO (small bowel obstruction)   . Hypertension   . Alzheimer's dementia   . Iron deficiency anemia   . Frequent UTI    Past Surgical History  Procedure Laterality Date  . Exploratory laparotomy, lysis of adhesions, small bowel  07/30/10   Family History  Problem Relation Age of Onset  . Cancer Son     Colon cancer  . Diabetes Son   . Diabetes Sister   . Hypertension Sister   . Stroke Sister    History  Substance Use Topics  . Smoking status: Never Smoker   . Smokeless tobacco: Never Used  . Alcohol Use: No   OB History   Grav Para Term Preterm Abortions TAB SAB Ect Mult Living                 Review of Systems  Unable to perform ROS: Dementia      Allergies  Review of patient's allergies indicates no known allergies.  Home Medications   No current outpatient prescriptions on file. BP  236/211  Pulse 78  Temp(Src) 98.9 F (37.2 C) (Axillary)  Resp 20  Ht 4\' 11"  (1.499 m)  Wt 115 lb 8.3 oz (52.4 kg)  BMI 23.32 kg/m2  SpO2 100% Physical Exam  Constitutional: She appears well-developed and well-nourished. No distress.  HENT:  Head: Normocephalic and atraumatic.  Mouth/Throat: No oropharyngeal exudate.  Dry mucus membranes.  Eyes: Conjunctivae and EOM are normal. Pupils are equal, round, and reactive to light.  Neck: Normal range of motion.  Cardiovascular: Normal rate, regular rhythm and normal heart sounds.   No murmur heard. Pulmonary/Chest: Effort normal and breath sounds normal. No respiratory distress.  Abdominal: Soft. There is no tenderness. There is no rebound and no guarding.  Genitourinary:  No fecal impaction  Musculoskeletal: Normal range of motion. She exhibits tenderness. She exhibits no edema.  TTP bilateral hips without obvious deformity. Contractures of BLE.   Neurological: She is alert.  Moves all extremities, doesn't follow commands.  Skin: Skin is warm.    ED Course  Procedures (including critical care time) Labs Review Labs Reviewed  COMPREHENSIVE METABOLIC PANEL - Abnormal; Notable for the following:    Creatinine, Ser 1.11 (*)    Albumin 3.1 (*)    GFR calc non Af Amer 43 (*)    GFR calc Af Amer 50 (*)  All other components within normal limits  URINALYSIS, ROUTINE W REFLEX MICROSCOPIC - Abnormal; Notable for the following:    APPearance CLOUDY (*)    Nitrite POSITIVE (*)    Leukocytes, UA MODERATE (*)    All other components within normal limits  CBC WITH DIFFERENTIAL - Abnormal; Notable for the following:    Platelets 149 (*)    Monocytes Relative 14 (*)    All other components within normal limits  URINE MICROSCOPIC-ADD ON - Abnormal; Notable for the following:    Bacteria, UA MANY (*)    All other components within normal limits  URINE CULTURE  TROPONIN I  CBC WITH DIFFERENTIAL  BASIC METABOLIC PANEL   Imaging  Review Dg Chest 2 View  09/17/2013   CLINICAL DATA:  Dementia  EXAM: CHEST  2 VIEW  COMPARISON:  DG CHEST 1V PORT dated 08/02/2010; DG ABDOMEN 1V dated 07/29/2010; DG CHEST 1V PORT dated 07/31/2010; DG CHEST 1V PORT dated 08/01/2010; DG CHEST 2 VIEW dated 01/09/2008  FINDINGS: Stable enlarged cardiac silhouette. There is left lower lobe opacity likely representing atelectasis. No effusion or pulmonary edema. No pneumothorax. There is a a density in the medial right lung apex which is similar to prior and likely represents a vascular structure.  IMPRESSION: 1. Left lower lobe atelectasis. 2. No acute cardiopulmonary findings.   Electronically Signed   By: Genevive Bi M.D.   On: 09/17/2013 12:43   Dg Pelvis 1-2 Views  09/17/2013   CLINICAL DATA:  80 year old female with lower extremity pain and hypertension. Weakness. Initial encounter.  EXAM: PELVIS - 1-2 VIEW  COMPARISON:  CT Abdomen and Pelvis 07/28/2010.  FINDINGS: Osteopenia. Oblique. Retained stool in the distal colon. Aortoiliac calcified atherosclerosis noted. Postoperative changes with suture in the midline lower abdomen. Bony pelvis appears stable and intact. Grossly intact proximal femurs.  IMPRESSION: No acute fracture or dislocation identified about the pelvis.   Electronically Signed   By: Augusto Gamble M.D.   On: 09/17/2013 12:27   Dg Femur Left  09/17/2013   CLINICAL DATA:  80 year old female with lower extremity pain and weakness. Initial encounter.  EXAM: LEFT FEMUR - 2 VIEW  COMPARISON:  Pelvis radiograph from the same day reported separately.  FINDINGS: Osteopenia. Left femoral head normally located. Visible left hemipelvis appears intact. Proximal left femur intact. Left femoral shaft intact. Distal left femur appears intact with advanced degenerative spurring of the femoral condyles and patellofemoral compartment. There may be associated joint space loss at the left knee.  IMPRESSION: No acute fracture or dislocation identified about the left  femur. If occult hip fracture is suspected or if the patient is unable to weightbear, MRI is the preferred modality for further evaluation.   Electronically Signed   By: Augusto Gamble M.D.   On: 09/17/2013 12:30   Dg Femur Right  09/17/2013   CLINICAL DATA:  80 year old female with pain and weakness. Initial encounter.  EXAM: RIGHT FEMUR - 2 VIEW  COMPARISON:  Pelvis radiograph from the same day reported separately.  FINDINGS: Osteopenia. Right femoral head normally located. Proximal right femur intact. Right femoral shaft and distal right femur intact. Alignment at the right knee appears preserved. Visible right pelvis appears intact. Aortoiliac calcified atherosclerosis noted.  IMPRESSION: No acute fracture or dislocation identified about the right femur. If occult hip fracture is suspected or if the patient is unable to weightbear, MRI is the preferred modality for further evaluation.   Electronically Signed   By: Augusto Gamble  M.D.   On: 09/17/2013 12:29   Ct Head Wo Contrast  09/17/2013   CLINICAL DATA:  Dementia, hypertension, left upper thigh pain.  EXAM: CT HEAD WITHOUT CONTRAST  TECHNIQUE: Contiguous axial images were obtained from the base of the skull through the vertex without intravenous contrast.  COMPARISON:  CT scan of Dec 03, 2007.  FINDINGS: Diffuse cortical atrophy is noted. Ventricular size is within normal limits given the degree of atrophy present. No mass effect or midline shift is noted. There is no evidence of mass lesion, hemorrhage or acute infarction. No definite abnormality seen involving bony calvarium. These images are somewhat limited due to patient motion artifact.  IMPRESSION: Diffuse cortical atrophy.  No acute intracranial abnormality seen.   Electronically Signed   By: Roque Lias M.D.   On: 09/17/2013 12:42   Ct Pelvis Wo Contrast  09/17/2013   CLINICAL DATA:  Left upper thigh pain.  EXAM: CT PELVIS WITHOUT CONTRAST  TECHNIQUE: Multidetector CT imaging of the pelvis was performed  following the standard protocol without intravenous contrast.  COMPARISON:  Single view of the pelvis 09/17/2013 at 11:46 a.m. CT abdomen and pelvis 07/28/2010.  FINDINGS: The hips are located. No acute fracture is identified. Remote healed fracture through the periphery of the left sacral ala is noted. No hip joint effusion is seen. There is fluid in the trochanteric bursae bilaterally compatible with bursitis. Pelvic musculature appears intact. Imaged lower lumbar spine demonstrates facet degenerative disease at L4-5 and L5-S1 with ankylosis of the facet joints noted. Imaged intrapelvic contents demonstrate a very large volume of stool in the rectosigmoid colon. Extensive aortoiliac atherosclerosis is present.  IMPRESSION: Negative for acute bony or joint abnormality. A remote left sacral ala fracture is seen.  Bilateral trochanteric bursitis.  Very large stool ball in the rectosigmoid colon compatible with fecal impaction.   Electronically Signed   By: Drusilla Kanner M.D.   On: 09/17/2013 13:49     EKG Interpretation None      MDM   Final diagnoses:  Hypertensive urgency  Urinary tract infection   Leg pain x 3 days, no history of fall.  Elevated blood pressure in setting of medications being stopped. Increased confusion.  CT head negative. Imaging of hips and femurs negative for fracture UA +. Will start rocephin.  Patient started on IV hydralazine and labetalol for BP. Daughter confirms DNR.  D/w Dr. Darnelle Catalan.  CRITICAL CARE Performed by: Glynn Octave Total critical care time: 30 Critical care time was exclusive of separately billable procedures and treating other patients. Critical care was necessary to treat or prevent imminent or life-threatening deterioration. Critical care was time spent personally by me on the following activities: development of treatment plan with patient and/or surrogate as well as nursing, discussions with consultants, evaluation of patient's response to  treatment, examination of patient, obtaining history from patient or surrogate, ordering and performing treatments and interventions, ordering and review of laboratory studies, ordering and review of radiographic studies, pulse oximetry and re-evaluation of patient's condition.       Glynn Octave, MD 09/17/13 [phone]

## 2013-09-17 NOTE — ED Notes (Signed)
Patient transported to CT 

## 2013-09-18 DIAGNOSIS — E440 Moderate protein-calorie malnutrition: Secondary | ICD-10-CM | POA: Insufficient documentation

## 2013-09-18 LAB — BASIC METABOLIC PANEL
BUN: 14 mg/dL (ref 6–23)
CALCIUM: 9 mg/dL (ref 8.4–10.5)
CO2: 23 mEq/L (ref 19–32)
Chloride: 104 mEq/L (ref 96–112)
Creatinine, Ser: 0.97 mg/dL (ref 0.50–1.10)
GFR calc Af Amer: 59 mL/min — ABNORMAL LOW (ref >90)
GFR calc non Af Amer: 51 mL/min — ABNORMAL LOW (ref >90)
GLUCOSE: 105 mg/dL — AB (ref 70–99)
Potassium: 4.5 mEq/L (ref 3.7–5.3)
SODIUM: 140 meq/L (ref 137–147)

## 2013-09-18 MED ORDER — ENSURE COMPLETE PO LIQD
237.0000 mL | Freq: Two times a day (BID) | ORAL | Status: DC
  Administered 2013-09-19 – 2013-09-23 (×3): 237 mL via ORAL

## 2013-09-18 MED ORDER — HYDRALAZINE HCL 20 MG/ML IJ SOLN
10.0000 mg | Freq: Four times a day (QID) | INTRAMUSCULAR | Status: DC | PRN
  Administered 2013-09-18 – 2013-09-21 (×3): 10 mg via INTRAVENOUS
  Filled 2013-09-18: qty 0.5
  Filled 2013-09-18: qty 1

## 2013-09-18 NOTE — Progress Notes (Signed)
OT Cancellation Note/Screen  Patient Details Name: Michelle KurtzGeneva Stempel MRN: [id number] DOB: 1924/12/01   Cancelled Treatment:    Reason Eval/Treat Not Completed: OT screened, no needs identified, will sign off;PT screened, no needs identified, will sign off.  Pt with advanced dementia per PT and did not follow commands.    Tarhonda Hollenberg 09/18/2013, 1:24 PM Marica OtterMaryellen Derica Leiber, OTR/L [phone] 09/18/2013

## 2013-09-18 NOTE — Progress Notes (Signed)
INITIAL NUTRITION ASSESSMENT  DOCUMENTATION CODES Per approved criteria  -Non-severe (moderate) malnutrition in the context of chronic illness   INTERVENTION:  Ensure Complete BID, each supplement provides 350 kcal and 13 grams protein  NUTRITION DIAGNOSIS: Predicted suboptimal energy intake related to dementia as evidenced by severe muscle wasting and moderate body fat depletion.   Goal: Patient to meet >/= 90% of estimated nutrition needs  Monitor:  PO intake and supplement acceptance, I/Os, weight trends, labs  Reason for Assessment: MD Consult for Malnutrition Assessment  80 y.o. female  Admitting Dx: Hypertensive urgency  ASSESSMENT: Michelle Hooper is an 80 y.o. female with a PMH of dementia, HTN (recently taken off anti-hypertensives), who was brought to the hospital via EMS for evaluation of LLE pain that began 2 days ago. The patient lives with her daughter, Michelle Hooper, who provides most of the history. Michelle Hooper reports that her mother has been non-ambulatory for the past few months, prior to this she ambulated with a walker, but developed fear of falling and subsequently refused to ambulate. She reports that over the last week, the patient has had diminished oral intake. Denies excessive daytime sleepiness, but notes that she is more confused than normal. The patient is incontinent of stool and urine at baseline, last BM 2 days ago according to daughter.   Dietetic intern unable to obtain nutrition history as patient has severe dementia and is essentially non-verbal.  Attempted to contact patient's daughter via telephone but received voicemail. Per conversation with nurse, patient's daughter visits in the afternoon around 2pm. Upon dietetic intern visit, nurse began assisting with breakfast feeding. No PO intake currently documented in chart.   Potassium WNL. No magnesium or phosphorus results.   Nutrition Focused Physical Exam:  Subcutaneous Fat:  Orbital Region:  moderate depletion Upper Arm Region: WNL Thoracic and Lumbar Region: N/A  Muscle:  Temple Region: severe depletion Clavicle Bone Region: moderate depletion Clavicle and Acromion Bone Region: moderate depletion Scapular Bone Region: N/A Dorsal Hand: moderate depletion Patellar Region: WNL Anterior Thigh Region: N/A Posterior Calf Region: WNL  Edema: RLE and LLE non-pitting  Patient meets criteria for non-severe (moderate) malnutrition in the context of chronic illness as evidenced by moderate body fat and severe muscle mass depletion.  Height: Ht Readings from Last 1 Encounters:  09/17/13 4\' 11"  (1.499 m)    Weight: Wt Readings from Last 1 Encounters:  09/18/13 116 lb 13.5 oz (53 kg)    Ideal Body Weight: 98 lb (44.5 kg)  % Ideal Body Weight: 118%  Wt Readings from Last 10 Encounters:  09/18/13 116 lb 13.5 oz (53 kg)    Usual Body Weight: unknown  % Usual Body Weight: unable to assess  BMI:  Body mass index is 23.59 kg/(m^2).  Estimated Nutritional Needs: Kcal: 1400-1600 Protein: 70-80 grams Fluid: >/= 1.5 L  Skin: no wounds  Diet Order: Sodium Restricted  EDUCATION NEEDS: -No education needs identified at this time   Intake/Output Summary (Last 24 hours) at 09/18/13 1043 Last data filed at 09/18/13 0600  Gross per 24 hour  Intake  922.5 ml  Output      0 ml  Net  922.5 ml    Last BM: 3/3  Labs:   Recent Labs Lab 09/17/13 1134 09/18/13 0331  NA 140 140  K 4.4 4.5  CL 102 104  CO2 25 23  BUN 16 14  CREATININE 1.11* 0.97  CALCIUM 9.2 9.0  GLUCOSE 96 105*    CBG (last 3)  No  results found for this basename: GLUCAP,  in the last 72 hours  Scheduled Meds: . amLODipine  10 mg Oral Daily  . cefTRIAXone (ROCEPHIN)  IV  1 g Intravenous Q24H  . enoxaparin (LOVENOX) injection  40 mg Subcutaneous Q24H  . hydrALAZINE  25 mg Oral 3 times per day  . polyethylene glycol  17 g Oral Daily  . sodium chloride  3 mL Intravenous Q12H     Continuous Infusions: . labetalol (NORMODYNE) infusion Stopped (09/17/13 2140)    Past Medical History  Diagnosis Date  . SBO (small bowel obstruction)   . Hypertension   . Alzheimer's dementia   . Iron deficiency anemia   . Frequent UTI     Past Surgical History  Procedure Laterality Date  . Exploratory laparotomy, lysis of adhesions, small bowel  07/30/10    Marlane MingleAshley Jaqwon Manfred, Dietetic Intern Pager: [phone]

## 2013-09-18 NOTE — Progress Notes (Signed)
Patient ID: Michelle Hooper, female   DOB: April 17, 1925, 80 y.o.   MRN: [id number]  [facility] PROGRESS NOTE  Caroll Weinheimer WUJ:[id number] DOB: [birth year] DOA: 09/17/2013 PCP: Jackie Plum, MD  Brief narrative: 80 y.o. female with a PMH of dementia, HTN (recently taken off anti-hypertensives), who was brought to the hospital via EMS for evaluation of LLE pain that began 2 days prior to this admission. The patient lives with her daughter, Michelle Hooper, who provided most of the history. She reported that over the last week, the patient has had diminished oral intake. Denied excessive daytime sleepiness, but notes that she is more confused than normal. The patient is incontinent of stool and urine at baseline, last BM 2 days PTA according to daughter.   Principal Problem:   Hypertensive urgency - BP better controlled - plan on transferring to telemetry bed - continue Norvasc and Hydralazine for now and readjust the regimen as indicated  Active Problems:   UTI (lower urinary tract infection) - continue Rocephin day #2 and follow up on urine culture    Alzheimer's dementia - at pt's baseline    Left leg pain - no acute fractures on XRAY - will consider MRI if pt with persistent pain - for now continue analgesia as needed    Severe protein-calorie malnutrition - nutrition consultation requested    Functional quadriplegia  - secondary to progressive failure to thrive and deconditioning   Acute kidney injury - likely pre renal and now Cr is WNL - stop IVF and repeat BMP in AM  Consultants:  None  Procedures/Studies: Dg Chest 2 View   09/17/2013  Left lower lobe atelectasis. No acute cardiopulmonary findings.    Dg Pelvis 1-2 Views  09/17/2013  No acute fracture or dislocation identified about the pelvis.   Dg Femur Left   09/17/2013  No acute fracture or dislocation identified about the left femur. If occult hip fracture is suspected or if the patient is unable to weightbear. Dg Femur  Right   09/17/2013  No acute fracture or dislocation identified about the right femur. If occult hip fracture is suspected or if the patient is unable to weightbear Ct Head Wo Contrast   09/17/2013   Diffuse cortical atrophy.  No acute intracranial abnormality seen.   Ct Pelvis Wo Contrast   09/17/2013 Negative for acute bony or joint abnormality. A remote left sacral ala fracture is seen.  Bilateral trochanteric bursitis.  Very large stool ball in the rectosigmoid colon compatible with fecal impaction.    Antibiotics:  Rocephin 03/03 -->  Code Status: DNR Family Communication: No family at bedside  Disposition Plan: Home when medically stable  HPI/Subjective: No events overnight.   Objective: Filed Vitals:   09/18/13 0330 09/18/13 0400 09/18/13 0500 09/18/13 0630  BP: 155/85 158/66 154/80 146/54  Pulse: 63 60 61 67  Temp:  98.6 F (37 C)    TempSrc:  Oral    Resp:  24    Height:      Weight:  53 kg (116 lb 13.5 oz)    SpO2:  99%      Intake/Output Summary (Last 24 hours) at 09/18/13 0855 Last data filed at 09/18/13 0600  Gross per 24 hour  Intake  922.5 ml  Output      0 ml  Net  922.5 ml    Exam:   General:  Pt is alert, follows some commands appropriately, not in acute distress  Cardiovascular: Regular rate and rhythm, S1/S2, no murmurs, no rubs,  no gallops  Respiratory: Clear to auscultation bilaterally, no wheezing, no crackles, no rhonchi  Abdomen: Soft, non tender, non distended, bowel sounds present, no guarding  Data Reviewed: Basic Metabolic Panel:  Recent Labs Lab 09/17/13 1134 09/18/13 0331  NA 140 140  K 4.4 4.5  CL 102 104  CO2 25 23  GLUCOSE 96 105*  BUN 16 14  CREATININE 1.11* 0.97  CALCIUM 9.2 9.0   Liver Function Tests:  Recent Labs Lab 09/17/13 1134  AST 24  ALT 10  ALKPHOS 71  BILITOT 0.3  PROT 6.5  ALBUMIN 3.1*   CBC:  Recent Labs Lab 09/17/13 1233  WBC 4.4  NEUTROABS 2.9  HGB 12.2  HCT 36.6  MCV 79.0  PLT 149*    Cardiac Enzymes:  Recent Labs Lab 09/17/13 1134  TROPONINI <0.30   Recent Results (from the past 240 hour(s))  MRSA PCR SCREENING     Status: None   Collection Time    09/17/13  6:57 PM      Result Value Ref Range Status   MRSA by PCR NEGATIVE  NEGATIVE Final   Comment:            The GeneXpert MRSA Assay (FDA     approved for NASAL specimens     only), is one component of a     comprehensive MRSA colonization     surveillance program. It is not     intended to diagnose MRSA     infection nor to guide or     monitor treatment for     MRSA infections.     Scheduled Meds: . amLODipine  10 mg Oral Daily  . cefTRIAXone  IV  1 g Intravenous Q24H  . enoxaparin  injection  40 mg Subcutaneous Q24H  . hydrALAZINE  25 mg Oral 3 times per day  . polyethylene glycol  17 g Oral Daily  . sodium chloride  3 mL Intravenous Q12H   Continuous Infusions: . sodium chloride 75 mL/hr at 09/18/13 0656  . labetalol (NORMODYNE) infusion Stopped (09/17/13 2140)   Debbora PrestoMAGICK-Sayge Salvato, MD  [facility] Pager [phone]  If 7PM-7AM, please contact night-coverage [email or web address] Password [email or web address] 09/18/2013, 8:55 AM   LOS: 1 day

## 2013-09-18 NOTE — Progress Notes (Signed)
I agree with dietetic intern's documentation, assessment, and recommendations.   Chantilly Linskey Baron MS, RD, LLevon HedgerN [phone] Pager [phone] After Hours Pager

## 2013-09-18 NOTE — Progress Notes (Signed)
Gave report to [place] rn

## 2013-09-18 NOTE — Evaluation (Signed)
Physical Therapy Evaluation Patient Details Name: Michelle Hooper MRN: [id number] DOB: August 22, 1924 Today's Date: 09/18/2013 Time: 4098-1191 PT Time Calculation (min): 18 min  PT Assessment / Plan / Recommendation History of Present Illness  Michelle Hooper is an 80 y.o. female with a PMH of dementia, HTN (recently taken off anti-hypertensives), who was brought to the hospital via EMS for evaluation of LLE pain that began 2 days ago.  The patient lives with her daughter, Michelle Hooper, who provides most of the history.  Michelle Hooper reports that her mother has been non-ambulatory for the past few months, prior to this she ambulated with a walker, but developed fear of falling and subsequently refused to ambulate.  She reports that over the last week, the patient has had diminished oral intake.  Clinical Impression  Pt unable to participate with PT, unable to follow commands, no family present at time of eval; generally total care per nsg staff    PT Assessment  Patent does not need any further PT services    Follow Up Recommendations  No PT follow up    Does the patient have the potential to tolerate intense rehabilitation      Barriers to Discharge        Equipment Recommendations  None recommended by PT    Recommendations for Other Services     Frequency      Precautions / Restrictions Precautions Precautions: Fall   Pertinent Vitals/Pain      Mobility  Bed Mobility Overal bed mobility: +2 for physical assistance;Needs Assistance Bed Mobility: Sit to Supine;Supine to Sit Supine to sit: Total assist;+2 for physical assistance Sit to supine: Total assist;+2 for physical assistance General bed mobility comments: pt unabel to assist self or follow commands    Exercises     PT Diagnosis:    PT Problem List:   PT Treatment Interventions:       PT Goals(Current goals can be found in the care plan section) Acute Rehab PT Goals PT Goal Formulation: No goals set, d/c  therapy  Visit Information  Last PT Received On: 09/18/13 Assistance Needed: +2 History of Present Illness: Michelle Hooper is an 80 y.o. female with a PMH of dementia, HTN (recently taken off anti-hypertensives), who was brought to the hospital via EMS for evaluation of LLE pain that began 2 days ago.  The patient lives with her daughter, Michelle Hooper, who provides most of the history.  Michelle Hooper reports that her mother has been non-ambulatory for the past few months, prior to this she ambulated with a walker, but developed fear of falling and subsequently refused to ambulate.  She reports that over the last week, the patient has had diminished oral intake.       Prior Functioning  Home Living Family/patient expects to be discharged to:: Unsure Living Arrangements: Children Additional Comments: lives with dtr per chart; no family present    Cognition  Cognition Arousal/Alertness: Awake/alert Behavior During Therapy: Flat affect Overall Cognitive Status: Impaired/Different from baseline    Extremity/Trunk Assessment Upper Extremity Assessment Upper Extremity Assessment: Generalized weakness Lower Extremity Assessment Lower Extremity Assessment: RLE deficits/detail;LLE deficits/detail RLE Deficits / Details: knee and hip flexion contractures-resistant to imposed movement LLE Deficits / Details: resistant to imposed movement-able to get Lknee and hip to near complete extension with incr time, passively   Balance Balance Overall balance assessment: Needs assistance Sitting-balance support: Feet unsupported;Single extremity supported Sitting balance-Leahy Scale: Zero Sitting balance - Comments: LOB posteriorly and to left; pt unable to follow commands to self  correct at all ; sat EOB x 6.5 min with varying degrees of assist  End of Session PT - End of Session Activity Tolerance: Other (comment) (unable to follow commands to participate with PT) Patient left: in bed;with call bell/phone  within reach Nurse Communication: Mobility status  GP     [facility] CenterWILLIAMS,Rosaly Labarbera 09/18/2013, 2:15 PM

## 2013-09-18 NOTE — Clinical Documentation Improvement (Signed)
  H&P gives diagnosis "hypertensive urgency". In the Coding world this term equates to "benign hypertension". Please clarify this term to reflect the severity of illness and risk of mortality for this patient. Thank you.  Possible Clinical Conditions?  - Accelerated Hypertension - Malignant Hypertension - Other Condition (please specify)   Supporting Information: - BP 265/110, 238/97, 200/78, 236/211, 223/105  - Signs and Symptoms: pain in leg w/inability to walk, UTI, fecal impaction - Alzheimers dementia - Apresoline 10mg  IV q6h prn, Labetalol IV drip  Thank You, Beverley FiedlerLaurie E Harvir Patry ,RN Clinical Documentation Specialist:  [phone]  [facility]- Health Information Management

## 2013-09-19 LAB — URINE CULTURE: Colony Count: >=100000

## 2013-09-19 LAB — BASIC METABOLIC PANEL
BUN: 13 mg/dL (ref 6–23)
CALCIUM: 8.9 mg/dL (ref 8.4–10.5)
CO2: 25 mEq/L (ref 19–32)
CREATININE: 1.02 mg/dL (ref 0.50–1.10)
Chloride: 108 mEq/L (ref 96–112)
GFR calc non Af Amer: 48 mL/min — ABNORMAL LOW (ref >90)
GFR, EST AFRICAN AMERICAN: 56 mL/min — AB (ref >90)
Glucose, Bld: 86 mg/dL (ref 70–99)
Potassium: 4.2 mEq/L (ref 3.7–5.3)
Sodium: 143 mEq/L (ref 137–147)

## 2013-09-19 LAB — CBC
HCT: 31.5 % — ABNORMAL LOW (ref 36.0–46.0)
Hemoglobin: 10.5 g/dL — ABNORMAL LOW (ref 12.0–15.0)
MCH: 26.3 pg (ref 26.0–34.0)
MCHC: 33.3 g/dL (ref 30.0–36.0)
MCV: 78.9 fL (ref 78.0–100.0)
PLATELETS: 150 10*3/uL (ref 150–400)
RBC: 3.99 MIL/uL (ref 3.87–5.11)
RDW: 13.8 % (ref 11.5–15.5)
WBC: 3.7 10*3/uL — ABNORMAL LOW (ref 4.0–10.5)

## 2013-09-19 MED ORDER — LABETALOL HCL 5 MG/ML IV SOLN
10.0000 mg | Freq: Two times a day (BID) | INTRAVENOUS | Status: DC
  Administered 2013-09-19 – 2013-09-22 (×6): 10 mg via INTRAVENOUS
  Filled 2013-09-19 (×7): qty 4

## 2013-09-19 MED ORDER — HYDRALAZINE HCL 20 MG/ML IJ SOLN
10.0000 mg | Freq: Four times a day (QID) | INTRAMUSCULAR | Status: DC
  Administered 2013-09-19 – 2013-09-21 (×7): 10 mg via INTRAVENOUS
  Filled 2013-09-19 (×11): qty 0.5

## 2013-09-19 MED ORDER — HYDRALAZINE HCL 50 MG PO TABS
50.0000 mg | ORAL_TABLET | Freq: Three times a day (TID) | ORAL | Status: DC
  Filled 2013-09-19 (×3): qty 1

## 2013-09-19 NOTE — Progress Notes (Signed)
Patient has very poor input, unable to take sips of fluids and I tried to feed her soft foods but spits everything out.  I paged Dr. Izola PriceMyers about this because I was having a very hard time giving her blood pressure medicine even when I crushed them in applesauce.  Dr. Izola PriceMyers changed her meds to IV.  Patient had one small incontinent void before lunch today but has not voided since.  I bladder scanned= 238mls and paged Dr. Izola PriceMyers about that as well.  I will report all information off to the night shift nurse and will continue to monitor.

## 2013-09-19 NOTE — Care Management Note (Addendum)
    Page 1 of 2   09/23/2013     4:18:47 PM   CARE MANAGEMENT NOTE 09/23/2013  Patient:  Michelle Hooper,Michelle Hooper   Account Number:  [id number]  Date Initiated:  09/19/2013  Documentation initiated by:  Lanier ClamMAHABIR,Angeletta Goelz  Subjective/Objective Assessment:   80 Y/O F ADMITTED W/HYPERTENSIVE CRISIS.CZ:YSAYTKZSHX:DEMENTIA.     Action/Plan:   FROM HOME W/CHILDREN.HAS PRIVATE SITTERS.PTA-TOTAL ASSIST.   Anticipated DC Date:  09/23/2013   Anticipated DC Plan:  HOME W HOME HEALTH SERVICES      DC Planning Services  CM consult      Choice offered to / List presented to:  C-4 Adult Children        HH arranged  HH-1 RN  HH-2 PT  HH-4 NURSE'S AIDE  HH-6 SOCIAL WORKER      HH agency  [facility]   Status of service:  In process, will continue to follow Medicare Important Message given?  NA - LOS <3 / Initial given by admissions (If response is "NO", the following Medicare IM given date fields will be blank) Date Medicare IM given:   Date Additional Medicare IM given:  09/23/2013  Discharge Disposition:  HOME W HOME HEALTH SERVICES  Per UR Regulation:  Reviewed for med. necessity/level of care/duration of stay  If discussed at Long Length of Stay Meetings, dates discussed:    Comments:  09/23/13 Michelle Sutherland RN,BSN NCM [phone] SPOKE TO DTR IN RM WHO STATES HER HOME TEL#[phone],WORK #[phone],[facility] CHOSEN FOR HHC.HHRN/PT/OT/SW/AIDE.PER [facility] REP Michelle Hooper/REP DTR WANTING HOSPITAL BED,GEL MATTRESS.MD TEXT PAGED W/RECOMMENDATIONS.AWAIT FURTHER DME ORDERS.NURSE NOTIFIED OF NEED FOR DME ORDERS.WILL NEED AMBULANCE TRANSP @ D/C-SW NOTIFIED. HHC ORDERS PLACED.ATTEMPTED TO CONTACT DTR Michelle Hooper C#[phone], LEFT VM TO DISCUSS HHC CHOICE.NURSE NOTIFIED TO CONTACT CM WHEN DTR ARRIVES.  09/19/13 Michelle Stammen RN,BSN NCM [phone] PT-NO PT F/U.NO ANTICIPATED D/C NEEDS.

## 2013-09-19 NOTE — Progress Notes (Signed)
Patient ID: Michelle Hooper, female   DOB: 08/07/1924, 80 y.o.   MRN: [id number] [facility] PROGRESS NOTE  Michelle Hooper WUJ:[id number] DOB: May 11, 1925 DOA: 09/17/2013 PCP: Jackie Plum, MD  Brief narrative: 80 y.o. female with a PMH of dementia, HTN (recently taken off anti-hypertensives), who was brought to the hospital via EMS for evaluation of LLE pain that began 2 days prior to this admission. The patient lives with her daughter, Michelle Hooper, who provided most of the history. She reported that over the last week, the patient has had diminished oral intake. Denied excessive daytime sleepiness, but notes that she is more confused than normal. The patient is incontinent of stool and urine at baseline, last BM 2 days PTA according to daughter.   Principal Problem:  Hypertensive urgency  - BP better controlled  - continue Norvasc and Hydralazine for now but will increase the dose of Hydralazine from 25 mg PO to 50 mg PO TID  - may need to transition to IV if pt not tolerating PO medications  Active Problems:  UTI (lower urinary tract infection)  - continue Rocephin day #4/7 - urine culture positive for Klebsiella and sensitive to Rocephin  Alzheimer's dementia  - at pt's baseline  Left leg pain  - no acute fractures on XRAY  - will consider MRI if pt with persistent pain  - for now continue analgesia as needed  Severe protein-calorie malnutrition  - nutrition consultation requested  Functional quadriplegia  - secondary to progressive failure to thrive and deconditioning  Acute kidney injury  - likely pre renal and now Cr is WNL  - stop IVF and repeat BMP in AM   Consultants:  None Procedures/Studies:  Dg Chest 2 View 09/17/2013 Left lower lobe atelectasis. No acute cardiopulmonary findings.  Dg Pelvis 1-2 Views 09/17/2013 No acute fracture or dislocation identified about the pelvis.  Dg Femur Left 09/17/2013 No acute fracture or dislocation identified about the left femur. If  occult hip fracture is suspected or if the patient is unable to weightbear.  Dg Femur Right 09/17/2013 No acute fracture or dislocation identified about the right femur. If occult hip fracture is suspected or if the patient is unable to weightbear  Ct Head Wo Contrast 09/17/2013 Diffuse cortical atrophy. No acute intracranial abnormality seen.  Ct Pelvis Wo Contrast 09/17/2013 Negative for acute bony or joint abnormality. A remote left sacral ala fracture is seen. Bilateral trochanteric bursitis. Very large stool ball in the rectosigmoid colon compatible with fecal impaction.  Antibiotics:  Rocephin 03/03 -->  Code Status: DNR  Family Communication: No family at bedside family updated over the phone   Disposition Plan: SNF when ready, for now remains inpatient   HPI/Subjective: No events overnight.   Objective: Filed Vitals:   09/18/13 1410 09/18/13 1900 09/19/13 0215 09/19/13 0450  BP: 138/62 160/107 148/58 171/66  Pulse: 82 73 76 68  Temp: 98 F (36.7 C) 98.1 F (36.7 C) 99.2 F (37.3 C) 97.7 F (36.5 C)  TempSrc: Oral Oral Axillary Axillary  Resp: 30 25 20 20   Height:      Weight: 56.246 kg (124 lb)     SpO2: 99% 98% 95% 96%    Intake/Output Summary (Last 24 hours) at 09/19/13 0858 Last data filed at 09/18/13 1700  Gross per 24 hour  Intake    110 ml  Output      0 ml  Net    110 ml    Exam:   General:  Pt is alert, follows  some commands appropriately, not in acute distress, confused and minimally verbal   Cardiovascular: Regular rate and rhythm, S1/S2, no murmurs, no rubs, no gallops  Respiratory: Clear to auscultation bilaterally, no wheezing, diminished breath sounds at bases   Abdomen: Soft, non tender, non distended, bowel sounds present, no guarding  Extremities: No edema, pulses DP and PT palpable bilaterally  Data Reviewed: Basic Metabolic Panel:  Recent Labs Lab 09/17/13 1134 09/18/13 0331 09/19/13 0535  NA 140 140 143  K 4.4 4.5 4.2  CL 102 104 108   CO2 25 23 25   GLUCOSE 96 105* 86  BUN 16 14 13   CREATININE 1.11* 0.97 1.02  CALCIUM 9.2 9.0 8.9   Liver Function Tests:  Recent Labs Lab 09/17/13 1134  AST 24  ALT 10  ALKPHOS 71  BILITOT 0.3  PROT 6.5  ALBUMIN 3.1*   CBC:  Recent Labs Lab 09/17/13 1233 09/19/13 0535  WBC 4.4 3.7*  NEUTROABS 2.9  --   HGB 12.2 10.5*  HCT 36.6 31.5*  MCV 79.0 78.9  PLT 149* 150   Cardiac Enzymes:  Recent Labs Lab 09/17/13 1134  TROPONINI <0.30    Recent Results (from the past 240 hour(s))  URINE CULTURE     Status: None   Collection Time    09/17/13  3:41 PM      Result Value Ref Range Status   Specimen Description URINE, CATHETERIZED   Final   Special Requests Rocephin   Final   Culture  Setup Time     Final   Value: 09/17/2013 23:27     [facility]   Colony Count     Final   Value: >=100,000 COLONIES/ML     [facility]formed at [facility]   Culture     Final   Value: KLEBSIELLA PNEUMONIAE     [facility]formed at [facility]   Report Status 09/19/2013 FINAL   Final   Organism ID, Bacteria KLEBSIELLA PNEUMONIAE   Final  MRSA PCR SCREENING     Status: None   Collection Time    09/17/13  6:57 PM      Result Value Ref Range Status   MRSA by PCR NEGATIVE  NEGATIVE Final   Comment:            The GeneXpert MRSA Assay (FDA     approved for NASAL specimens     only), is one component of a     comprehensive MRSA colonization     surveillance program. It is not     intended to diagnose MRSA     infection nor to guide or     monitor treatment for     MRSA infections.     Scheduled Meds: . amLODipine  10 mg Oral Daily  . cefTRIAXone (ROCEPHIN)  IV  1 g Intravenous Q24H  . enoxaparin (LOVENOX) injection  40 mg Subcutaneous Q24H  . feeding supplement (ENSURE COMPLETE)  237 mL Oral BID BM  . hydrALAZINE  25 mg Oral 3 times per day  . polyethylene glycol  17 g Oral Daily  . sodium chloride  3 mL Intravenous Q12H   Continuous Infusions:     Debbora PrestoMAGICK-Laraina Sulton, MD  [facility] Pager [phone]  If 7PM-7AM, please contact night-coverage [email or web address] Password [email or web address] 09/19/2013, 8:58 AM   LOS: 2 days

## 2013-09-20 LAB — BASIC METABOLIC PANEL
BUN: 13 mg/dL (ref 6–23)
CHLORIDE: 107 meq/L (ref 96–112)
CO2: 24 mEq/L (ref 19–32)
CREATININE: 0.98 mg/dL (ref 0.50–1.10)
Calcium: 9.1 mg/dL (ref 8.4–10.5)
GFR, EST AFRICAN AMERICAN: 58 mL/min — AB (ref >90)
GFR, EST NON AFRICAN AMERICAN: 50 mL/min — AB (ref >90)
Glucose, Bld: 85 mg/dL (ref 70–99)
Potassium: 3.9 mEq/L (ref 3.7–5.3)
Sodium: 143 mEq/L (ref 137–147)

## 2013-09-20 LAB — CBC
HCT: 32.2 % — ABNORMAL LOW (ref 36.0–46.0)
Hemoglobin: 10.7 g/dL — ABNORMAL LOW (ref 12.0–15.0)
MCH: 26 pg (ref 26.0–34.0)
MCHC: 33.2 g/dL (ref 30.0–36.0)
MCV: 78.2 fL (ref 78.0–100.0)
PLATELETS: 201 10*3/uL (ref 150–400)
RBC: 4.12 MIL/uL (ref 3.87–5.11)
RDW: 13.7 % (ref 11.5–15.5)
WBC: 4.4 10*3/uL (ref 4.0–10.5)

## 2013-09-20 NOTE — Progress Notes (Signed)
Patient ID: Michelle Hooper, female   DOB: [birth year], 80 y.o.   MRN: [id number] [facility] PROGRESS NOTE  Michelle Hooper YQM:[id number] DOB: 1924-09-18 DOA: 09/17/2013 PCP: Jackie Plum, MD  Brief narrative:  80 y.o. female with a PMH of dementia, HTN (recently taken off anti-hypertensives), who was brought to the hospital via EMS for evaluation of LLE pain that began 2 days prior to this admission. The patient lives with her daughter, [name], who provided most of the history. She reported that over the last week, the patient has had diminished oral intake. Denied excessive daytime sleepiness, but notes that she is more confused than normal. The patient is incontinent of stool and urine at baseline, last BM 2 days PTA according to daughter.   Principal Problem:  Hypertensive urgency  - BP better controlled  - continue Norvasc, Hydralazine, Labetalol  Active Problems:  UTI (lower urinary tract infection)  - continue Rocephin day #5/7  - urine culture positive for Klebsiella and sensitive to Rocephin  Alzheimer's dementia  - at pt's baseline  Left leg pain  - no acute fractures on XRAY  - for now continue analgesia as needed  Severe protein-calorie malnutrition  - nutrition consultation requested and recommendations appreciated  Functional quadriplegia  - secondary to progressive failure to thrive and deconditioning  Acute kidney injury  - likely pre renal and now Cr is WNL  - stopped IVF and repeat BMP in AM   Consultants:  None Procedures/Studies:  Dg Chest 2 View 09/17/2013 Left lower lobe atelectasis. No acute cardiopulmonary findings.  Dg Pelvis 1-2 Views 09/17/2013 No acute fracture or dislocation identified about the pelvis.  Dg Femur Left 09/17/2013 No acute fracture or dislocation identified about the left femur. If occult hip fracture is suspected or if the patient is unable to weightbear.  Dg Femur Right 09/17/2013 No acute fracture or dislocation identified about  the right femur. If occult hip fracture is suspected or if the patient is unable to weightbear  Ct Head Wo Contrast 09/17/2013 Diffuse cortical atrophy. No acute intracranial abnormality seen.  Ct Pelvis Wo Contrast 09/17/2013 Negative for acute bony or joint abnormality. A remote left sacral ala fracture is seen. Bilateral trochanteric bursitis. Very large stool ball in the rectosigmoid colon compatible with fecal impaction.  Antibiotics:  Rocephin 03/03 -->  Code Status: DNR  Family Communication: No family at bedside family updated over the phone  Disposition Plan: SNF when ready, for now remains inpatient   HPI/Subjective: No events overnight.   Objective: Filed Vitals:   09/20/13 1011 09/20/13 1113 09/20/13 1255 09/20/13 1649  BP:  180/59 182/80 146/70  Pulse:  86 93 95  Temp: 99.3 F (37.4 C)     TempSrc: Oral     Resp:      Height:      Weight:      SpO2:        Intake/Output Summary (Last 24 hours) at 09/20/13 1652 Last data filed at 09/20/13 1253  Gross per 24 hour  Intake     75 ml  Output      0 ml  Net     75 ml    Exam:   General:  Pt is alert, follows some commands, not in acute distress  Cardiovascular: Regular rate and rhythm, S1/S2, no murmurs, no rubs, no gallops  Respiratory: Clear to auscultation bilaterally, diminished breath sounds at bases   Abdomen: Soft, non tender, non distended, bowel sounds present, no guarding  Extremities: No edema, pulses  DP and PT palpable bilaterally  Data Reviewed: Basic Metabolic Panel:  Recent Labs Lab 09/17/13 1134 09/18/13 0331 09/19/13 0535 09/20/13 0330  NA 140 140 143 143  K 4.4 4.5 4.2 3.9  CL 102 104 108 107  CO2 25 23 25 24   GLUCOSE 96 105* 86 85  BUN 16 14 13 13   CREATININE 1.11* 0.97 1.02 0.98  CALCIUM 9.2 9.0 8.9 9.1   Liver Function Tests:  Recent Labs Lab 09/17/13 1134  AST 24  ALT 10  ALKPHOS 71  BILITOT 0.3  PROT 6.5  ALBUMIN 3.1*   CBC:  Recent Labs Lab 09/17/13 1233  09/19/13 0535 09/20/13 0330  WBC 4.4 3.7* 4.4  NEUTROABS 2.9  --   --   HGB 12.2 10.5* 10.7*  HCT 36.6 31.5* 32.2*  MCV 79.0 78.9 78.2  PLT 149* 150 201   Cardiac Enzymes:  Recent Labs Lab 09/17/13 1134  TROPONINI <0.30   Recent Results (from the past 240 hour(s))  URINE CULTURE     Status: None   Collection Time    09/17/13  3:41 PM      Result Value Ref Range Status   Specimen Description URINE, CATHETERIZED   Final   Special Requests Rocephin   Final   Culture  Setup Time     Final   Value: 09/17/2013 23:27     Performed at [facility]   Colony Count     Final   Value: >=100,000 COLONIES/ML     Performed at [facility]   Culture     Final   Value: KLEBSIELLA PNEUMONIAE     Performed at [facility]   Report Status 09/19/2013 FINAL   Final   Organism ID, Bacteria KLEBSIELLA PNEUMONIAE   Final  MRSA PCR SCREENING     Status: None   Collection Time    09/17/13  6:57 PM      Result Value Ref Range Status   MRSA by PCR NEGATIVE  NEGATIVE Final   Comment:            The GeneXpert MRSA Assay (FDA     approved for NASAL specimens     only), is one component of a     comprehensive MRSA colonization     surveillance program. It is not     intended to diagnose MRSA     infection nor to guide or     monitor treatment for     MRSA infections.     Scheduled Meds: . amLODipine  10 mg Oral Daily  . cefTRIAXone  IV  1 g Intravenous Q24H  . enoxaparin  injection  40 mg Subcutaneous Q24H  . hydrALAZINE  10 mg Intravenous Q6H  . labetalol  10 mg Intravenous BID   Continuous Infusions:   Debbora PrestoMAGICK-Kaprice Kage, MD  [facility] Pager [phone]  If 7PM-7AM, please contact night-coverage [email or web address] Password [email or web address] 09/20/2013, 4:52 PM   LOS: 3 days

## 2013-09-21 LAB — BASIC METABOLIC PANEL
BUN: 13 mg/dL (ref 6–23)
CHLORIDE: 110 meq/L (ref 96–112)
CO2: 21 mEq/L (ref 19–32)
Calcium: 9.4 mg/dL (ref 8.4–10.5)
Creatinine, Ser: 0.86 mg/dL (ref 0.50–1.10)
GFR calc non Af Amer: 59 mL/min — ABNORMAL LOW (ref >90)
GFR, EST AFRICAN AMERICAN: 68 mL/min — AB (ref >90)
Glucose, Bld: 95 mg/dL (ref 70–99)
POTASSIUM: 4.7 meq/L (ref 3.7–5.3)
Sodium: 144 mEq/L (ref 137–147)

## 2013-09-21 LAB — CBC
HEMATOCRIT: 34.2 % — AB (ref 36.0–46.0)
HEMOGLOBIN: 11.6 g/dL — AB (ref 12.0–15.0)
MCH: 26.5 pg (ref 26.0–34.0)
MCHC: 33.9 g/dL (ref 30.0–36.0)
MCV: 78.1 fL (ref 78.0–100.0)
Platelets: 175 10*3/uL (ref 150–400)
RBC: 4.38 MIL/uL (ref 3.87–5.11)
RDW: 13.7 % (ref 11.5–15.5)
WBC: 5.1 10*3/uL (ref 4.0–10.5)

## 2013-09-21 MED ORDER — HYDRALAZINE HCL 20 MG/ML IJ SOLN
20.0000 mg | Freq: Four times a day (QID) | INTRAMUSCULAR | Status: DC
  Administered 2013-09-21 – 2013-09-22 (×4): 20 mg via INTRAVENOUS
  Filled 2013-09-21 (×7): qty 1

## 2013-09-21 MED ORDER — HYDRALAZINE HCL 20 MG/ML IJ SOLN
10.0000 mg | Freq: Once | INTRAMUSCULAR | Status: AC
  Administered 2013-09-21: 10 mg via INTRAVENOUS
  Filled 2013-09-21: qty 0.5

## 2013-09-21 NOTE — Progress Notes (Signed)
Pt BP 171/125.  Notified MD.  Adm hydralazine 10 mg IV.  Will continue to monitor.

## 2013-09-21 NOTE — Progress Notes (Signed)
Patient ID: Michelle Hooper, female   DOB: 03-25-1925, 80 y.o.   MRN: [id number]  [facility] PROGRESS NOTE  Michelle Hooper HYQ:[id number] DOB: [birth year] DOA: 09/17/2013 PCP: Jackie Plum, MD  Brief narrative:  80 y.o. female with a PMH of dementia, HTN (recently taken off anti-hypertensives), who was brought to the hospital via EMS for evaluation of LLE pain that began 2 days prior to this admission. The patient lives with her daughter, Michelle Hooper, who provided most of the history. She reported that over the last week, the patient has had diminished oral intake. Denied excessive daytime sleepiness, but notes that she is more confused than normal. The patient is incontinent of stool and urine at baseline, last BM 2 days PTA according to daughter.   Principal Problem:  Hypertensive urgency  - BP better controlled but SBP still in 160 - 170's - continue Norvasc, Hydralazine, Labetalol  Active Problems:  UTI (lower urinary tract infection)  - continue Rocephin day #6/7  - urine culture positive for Klebsiella and sensitive to Rocephin  Alzheimer's dementia  - at pt's baseline  Left leg pain  - no acute fractures on XRAY  - for now continue analgesia as needed  Severe protein-calorie malnutrition  - nutrition consultation requested and recommendations appreciated  Functional quadriplegia  - secondary to progressive failure to thrive and deconditioning  Acute kidney injury  - likely pre renal and now Cr is WNL  - stopped IVF and repeat BMP in AM   Consultants:  None Procedures/Studies:  Dg Chest 2 View 09/17/2013 Left lower lobe atelectasis. No acute cardiopulmonary findings.  Dg Pelvis 1-2 Views 09/17/2013 No acute fracture or dislocation identified about the pelvis.  Dg Femur Left 09/17/2013 No acute fracture or dislocation identified about the left femur. If occult hip fracture is suspected or if the patient is unable to weightbear.  Dg Femur Right 09/17/2013 No acute fracture or  dislocation identified about the right femur. If occult hip fracture is suspected or if the patient is unable to weightbear  Ct Head Wo Contrast 09/17/2013 Diffuse cortical atrophy. No acute intracranial abnormality seen.  Ct Pelvis Wo Contrast 09/17/2013 Negative for acute bony or joint abnormality. A remote left sacral ala fracture is seen. Bilateral trochanteric bursitis. Very large stool ball in the rectosigmoid colon compatible with fecal impaction.  Antibiotics:  Rocephin 03/03 -->  Code Status: DNR  Family Communication: No family at bedside family updated over the phone  Disposition Plan: SNF when ready, for now remains inpatient, likely Monday  HPI/Subjective: No events overnight.   Objective: Filed Vitals:   09/20/13 2018 09/21/13 0003 09/21/13 0512 09/21/13 1025  BP: 186/64 170/59 186/75 174/71  Pulse: 88 79 78 72  Temp: 98.3 F (36.8 C) 98.5 F (36.9 C) 97.9 F (36.6 C) 98.2 F (36.8 C)  TempSrc: Axillary Axillary Axillary Oral  Resp: 18 18 19 20   Height:      Weight:      SpO2: 100% 100% 100% 100%    Intake/Output Summary (Last 24 hours) at 09/21/13 1329 Last data filed at 09/21/13 1045  Gross per 24 hour  Intake    150 ml  Output      0 ml  Net    150 ml    Exam:   General:  Pt is more alert but confused and not following commands  Cardiovascular: Regular rate and rhythm, S1/S2, no rubs, no gallops  Respiratory: Clear to auscultation bilaterally, no wheezing, diminished breath sounds at bases   Abdomen:  Soft, non tender, non distended, bowel sounds present, no guarding  Extremities: No edema, pulses DP and PT palpable bilaterally  Data Reviewed: Basic Metabolic Panel:  Recent Labs Lab 09/17/13 1134 09/18/13 0331 09/19/13 0535 09/20/13 0330 09/21/13 0458  NA 140 140 143 143 144  K 4.4 4.5 4.2 3.9 4.7  CL 102 104 108 107 110  CO2 25 23 25 24 21   GLUCOSE 96 105* 86 85 95  BUN 16 14 13 13 13   CREATININE 1.11* 0.97 1.02 0.98 0.86  CALCIUM 9.2  9.0 8.9 9.1 9.4   Liver Function Tests:  Recent Labs Lab 09/17/13 1134  AST 24  ALT 10  ALKPHOS 71  BILITOT 0.3  PROT 6.5  ALBUMIN 3.1*   CBC:  Recent Labs Lab 09/17/13 1233 09/19/13 0535 09/20/13 0330 09/21/13 0706  WBC 4.4 3.7* 4.4 5.1  NEUTROABS 2.9  --   --   --   HGB 12.2 10.5* 10.7* 11.6*  HCT 36.6 31.5* 32.2* 34.2*  MCV 79.0 78.9 78.2 78.1  PLT 149* 150 201 175   Cardiac Enzymes:  Recent Labs Lab 09/17/13 1134  TROPONINI <0.30   Recent Results (from the past 240 hour(s))  URINE CULTURE     Status: None   Collection Time    09/17/13  3:41 PM      Result Value Ref Range Status   Specimen Description URINE, CATHETERIZED   Final   Special Requests Rocephin   Final   Culture  Setup Time     Final   Value: 09/17/2013 23:27     Performed at [facility]   Colony Count     Final   Value: >=100,000 COLONIES/ML     Performed at [facility]   Culture     Final   Value: KLEBSIELLA PNEUMONIAE     Performed at [facility]   Report Status 09/19/2013 FINAL   Final   Organism ID, Bacteria KLEBSIELLA PNEUMONIAE   Final  MRSA PCR SCREENING     Status: None   Collection Time    09/17/13  6:57 PM      Result Value Ref Range Status   MRSA by PCR NEGATIVE  NEGATIVE Final   Comment:            The GeneXpert MRSA Assay (FDA     approved for NASAL specimens     only), is one component of a     comprehensive MRSA colonization     surveillance program. It is not     intended to diagnose MRSA     infection nor to guide or     monitor treatment for     MRSA infections.     Scheduled Meds: . amLODipine  10 mg Oral Daily  . cefTRIAXone (ROCEPHIN)  IV  1 g Intravenous Q24H  . enoxaparin (LOVENOX) injection  40 mg Subcutaneous Q24H  . feeding supplement (ENSURE COMPLETE)  237 mL Oral BID BM  . hydrALAZINE  10 mg Intravenous Q6H  . labetalol  10 mg Intravenous BID  . polyethylene glycol  17 g Oral Daily  . sodium chloride  3 mL Intravenous  Q12H   Continuous Infusions:  Debbora PrestoMAGICK-MYERS, ISKRA, MD  [facility] Pager [phone]  If 7PM-7AM, please contact night-coverage [email or web address] Password [email or web address] 09/21/2013, 1:29 PM   LOS: 4 days

## 2013-09-22 LAB — BASIC METABOLIC PANEL
BUN: 14 mg/dL (ref 6–23)
CALCIUM: 9.2 mg/dL (ref 8.4–10.5)
CO2: 22 meq/L (ref 19–32)
Chloride: 110 mEq/L (ref 96–112)
Creatinine, Ser: 1 mg/dL (ref 0.50–1.10)
GFR calc Af Amer: 57 mL/min — ABNORMAL LOW (ref >90)
GFR calc non Af Amer: 49 mL/min — ABNORMAL LOW (ref >90)
GLUCOSE: 74 mg/dL (ref 70–99)
Potassium: 3.8 mEq/L (ref 3.7–5.3)
SODIUM: 146 meq/L (ref 137–147)

## 2013-09-22 LAB — CBC
HCT: 32.3 % — ABNORMAL LOW (ref 36.0–46.0)
HEMOGLOBIN: 11 g/dL — AB (ref 12.0–15.0)
MCH: 26.8 pg (ref 26.0–34.0)
MCHC: 34.1 g/dL (ref 30.0–36.0)
MCV: 78.6 fL (ref 78.0–100.0)
Platelets: 191 10*3/uL (ref 150–400)
RBC: 4.11 MIL/uL (ref 3.87–5.11)
RDW: 13.8 % (ref 11.5–15.5)
WBC: 4.1 10*3/uL (ref 4.0–10.5)

## 2013-09-22 MED ORDER — CLONIDINE HCL 0.2 MG/24HR TD PTWK: 0.2000 mg | MEDICATED_PATCH | TRANSDERMAL | Status: DC

## 2013-09-22 MED ORDER — AMLODIPINE BESYLATE 10 MG PO TABS: 10.0000 mg | ORAL_TABLET | Freq: Every day | ORAL | Status: DC

## 2013-09-22 MED ORDER — CLONIDINE HCL 0.2 MG/24HR TD PTWK
0.2000 mg | MEDICATED_PATCH | TRANSDERMAL | Status: DC
  Administered 2013-09-22: 0.2 mg via TRANSDERMAL
  Filled 2013-09-22: qty 1

## 2013-09-22 MED ORDER — HYDRALAZINE HCL 50 MG PO TABS
50.0000 mg | ORAL_TABLET | Freq: Three times a day (TID) | ORAL | Status: DC
  Administered 2013-09-22 – 2013-09-23 (×3): 50 mg via ORAL
  Filled 2013-09-22 (×6): qty 1

## 2013-09-22 MED ORDER — HYDRALAZINE HCL 50 MG PO TABS: 50.0000 mg | ORAL_TABLET | Freq: Three times a day (TID) | ORAL | Status: DC

## 2013-09-22 NOTE — Discharge Instructions (Signed)

## 2013-09-22 NOTE — Progress Notes (Signed)
Patient ID: Michelle Hooper, female   DOB: [birth year], 80 y.o.   MRN: [id number] [facility] PROGRESS NOTE  Michelle Hooper [id number]:[id number] DOB: [birth year] DOA: 09/17/2013 PCP: Michelle PlumSEI-BONSU,[name], MD  Brief narrative:  80 y.o. female with a PMH of dementia, HTN (recently taken off anti-hypertensives), who was brought to the hospital via EMS for evaluation of LLE pain that began 2 days prior to this admission. The patient lives with her daughter, [name], who provided most of the history. She reported that over the last week, the patient has had diminished oral intake. Denied excessive daytime sleepiness, but notes that she is more confused than normal. The patient is incontinent of stool and urine at baseline, last BM 2 days PTA according to daughter.   Principal Problem:  Hypertensive urgency  - BP better controlled but SBP in 130 - 140 - continue Norvasc, Hydralazine, Labetalol but will try to transition to oral regimen - will also try to place on clonidine patch  Active Problems:  UTI (lower urinary tract infection)  - continue Rocephin day #6/7  - urine culture positive for Klebsiella and sensitive to Rocephin  Alzheimer's dementia  - at pt's baseline  Left leg pain  - no acute fractures on XRAY  - for now continue analgesia as needed  Severe protein-calorie malnutrition  - nutrition consultation requested and recommendations appreciated  Functional quadriplegia  - secondary to progressive failure to thrive and deconditioning  Acute kidney injury  - likely pre renal and now Cr is WNL  - stopped IVF and repeat BMP in AM   Consultants:  None Procedures/Studies:  Dg Chest 2 View 09/17/2013 Left lower lobe atelectasis. No acute cardiopulmonary findings.  Dg Pelvis 1-2 Views 09/17/2013 No acute fracture or dislocation identified about the pelvis.  Dg Femur Left 09/17/2013 No acute fracture or dislocation identified about the left femur. If occult hip fracture is suspected or if the  patient is unable to weightbear.  Dg Femur Right 09/17/2013 No acute fracture or dislocation identified about the right femur. If occult hip fracture is suspected or if the patient is unable to weightbear  Ct Head Wo Contrast 09/17/2013 Diffuse cortical atrophy. No acute intracranial abnormality seen.  Ct Pelvis Wo Contrast 09/17/2013 Negative for acute bony or joint abnormality. A remote left sacral ala fracture is seen. Bilateral trochanteric bursitis. Very large stool ball in the rectosigmoid colon compatible with fecal impaction.  Antibiotics:  Rocephin 03/03 --> 3/9  Code Status: DNR  Family Communication: No family at bedside family updated over the phone  Disposition Plan: home in AM if BP stable  HPI/Subjective: No events overnight.   Objective: Filed Vitals:   09/22/13 0425 09/22/13 1054 09/22/13 1256 09/22/13 1407  BP: 142/56 129/79 148/70 136/65  Pulse: 79 78  88  Temp: 98.1 F (36.7 C) 98.8 F (37.1 C)  98.9 F (37.2 C)  TempSrc: Oral Axillary  Axillary  Resp: 19 18  18   Height:      Weight:      SpO2: 100% 100%  97%    Intake/Output Summary (Last 24 hours) at 09/22/13 1511 Last data filed at 09/22/13 1300  Gross per 24 hour  Intake    170 ml  Output      0 ml  Net    170 ml    Exam:   General:  Pt is alert, follows some commands appropriately, not in acute distress  Cardiovascular: Regular rate and rhythm, no rubs, no gallops  Respiratory: Clear to auscultation bilaterally,  no wheezing, no crackles, no rhonchi  Abdomen: Soft, non tender, non distended, bowel sounds present, no guarding  Extremities: No edema, pulses DP and PT palpable bilaterally  Data Reviewed: Basic Metabolic Panel:  Recent Labs Lab 09/18/13 0331 09/19/13 0535 09/20/13 0330 09/21/13 0458 09/22/13 0420  NA 140 143 143 144 146  K 4.5 4.2 3.9 4.7 3.8  CL 104 108 107 110 110  CO2 23 25 24 21 22   GLUCOSE 105* 86 85 95 74  BUN 14 13 13 13 14   CREATININE 0.97 1.02 0.98 0.86 1.00   CALCIUM 9.0 8.9 9.1 9.4 9.2   Liver Function Tests:  Recent Labs Lab 09/17/13 1134  AST 24  ALT 10  ALKPHOS 71  BILITOT 0.3  PROT 6.5  ALBUMIN 3.1*   CBC:  Recent Labs Lab 09/17/13 1233 09/19/13 0535 09/20/13 0330 09/21/13 0706 09/22/13 0420  WBC 4.4 3.7* 4.4 5.1 4.1  NEUTROABS 2.9  --   --   --   --   HGB 12.2 10.5* 10.7* 11.6* 11.0*  HCT 36.6 31.5* 32.2* 34.2* 32.3*  MCV 79.0 78.9 78.2 78.1 78.6  PLT 149* 150 201 175 191   Cardiac Enzymes:  Recent Labs Lab 09/17/13 1134  TROPONINI <0.30    Recent Results (from the past 240 hour(s))  URINE CULTURE     Status: None   Collection Time    09/17/13  3:41 PM      Result Value Ref Range Status   Specimen Description URINE, CATHETERIZED   Final   Special Requests Rocephin   Final   Culture  Setup Time     Final   Value: 09/17/2013 23:27     Performed at [facility] Count     Final   Value: >=100,000 COLONIES/ML     Performed at [facility]   Culture     Final   Value: KLEBSIELLA PNEUMONIAE     Performed at [facility]   Report Status 09/19/2013 FINAL   Final   Organism ID, Bacteria KLEBSIELLA PNEUMONIAE   Final  MRSA PCR SCREENING     Status: None   Collection Time    09/17/13  6:57 PM      Result Value Ref Range Status   MRSA by PCR NEGATIVE  NEGATIVE Final   Comment:            The GeneXpert MRSA Assay (FDA     approved for NASAL specimens     only), is one component of a     comprehensive MRSA colonization     surveillance program. It is not     intended to diagnose MRSA     infection nor to guide or     monitor treatment for     MRSA infections.     Scheduled Meds: . amLODipine  10 mg Oral Daily  . cefTRIAXone (ROCEPHIN)  IV  1 g Intravenous Q24H  . enoxaparin (LOVENOX) injection  40 mg Subcutaneous Q24H  . feeding supplement (ENSURE COMPLETE)  237 mL Oral BID BM  . hydrALAZINE  20 mg Intravenous Q6H  . labetalol  10 mg Intravenous BID  . polyethylene  glycol  17 g Oral Daily  . sodium chloride  3 mL Intravenous Q12H   Continuous Infusions:  Debbora Presto, MD  [facility] Pager [phone]  If 7PM-7AM, please contact night-coverage [email or web address] Password [email or web address] 09/22/2013, 3:11 PM   LOS: 5 days

## 2013-09-22 NOTE — Progress Notes (Signed)
Long discussion had with Pt's daughter about d/c plans, as, per MD note and RN, Pt to d/c to SNF tomorrow.  Pt's daughter is adamant that Pt will not go to a SNF.  She stated that Pt isn't able to participate in rehab, as she is end-stage dementia and cannot follow commands.  Additionally, Pt has been in rehabs, by hx, and had awful experiences, including extensive bed sores.  Pt's daughter stated that her own daughter died in a rehab from neglect; she will not subject her Pt to that.  Pt's daughter stated that a retired CNA stays with Pt during the day until she, daughter, gets home in the evenings.  She stated that this set-up has been working just fine for everyone and Pt's daughter doesn't see any reason to mess this up.  She stated that she wants Pt to be in comfortable, familiar surroundings with those that love and care about her.  CSW thanked Pt's daughter for her time.  RN and MD notified of Pt's daughter's wishes.  No further needs identified.  Michelle Hooper, LCSWA Clinical Social Work [phone]

## 2013-09-23 NOTE — Progress Notes (Signed)
CSW confirmed address with daughter at bedside. ptar set up.  Sharilynn Cassity C. Jaceon Heiberger MSW, LCSW [phone]

## 2013-09-23 NOTE — Progress Notes (Signed)
Discharged to home, p/u by PTAR, daughter at bedside, d/c instructions and follow up appointments  done and was given to the daughter.

## 2013-09-23 NOTE — Discharge Summary (Signed)
Physician Discharge Summary  Michelle KurtzGeneva Hooper ZOX:[id number] DOB: 04-23-1925 DOA: 09/17/2013  PCP: Jackie PlumSEI-BONSU,[name], MD  Admit date: 09/17/2013 Discharge date: 09/23/2013  Recommendations for Outpatient Follow-up:  1. Pt will need to follow up with PCP in 2-3 weeks post discharge 2. Please obtain BMP to evaluate electrolytes and kidney function 3. Please also check CBC to evaluate Hg and Hct levels  Discharge Diagnoses: Hypertensive urgency  Principal Problem:   Hypertensive urgency Active Problems:   UTI (lower urinary tract infection)   Alzheimer's dementia   Left leg pain   Severe protein-calorie malnutrition   Immobility   Acute kidney injury   Accelerated hypertension   Malnutrition of moderate degree    Discharge Condition: Stable  Diet recommendation: Heart healthy diet discussed in details as pt able to tolerate   Brief narrative:  80 y.o. female with a PMH of dementia, HTN (recently taken off anti-hypertensives), who was brought to the hospital via EMS for evaluation of LLE pain that began 2 days prior to this admission. The patient lives with her daughter, Michelle Hooper, who provided most of the history. She reported that over the last week, the patient has had diminished oral intake. Denied excessive daytime sleepiness, but notes that she is more confused than normal. The patient is incontinent of stool and urine at baseline, last BM 2 days PTA according to daughter.   Principal Problem:  Hypertensive urgency  - BP better controlled but SBP in 130 - 140  - add clonidine patch - will also try to place on clonidine patch to norvasc and hydralazine  Active Problems:  UTI (lower urinary tract infection)  - continue Rocephin day #7/7, no need for script post discharge   - urine culture positive for Klebsiella and sensitive to Rocephin  Alzheimer's dementia  - at pt's baseline  Left leg pain  - no acute fractures on XRAY  - for now continue analgesia as needed  Severe  protein-calorie malnutrition  - nutrition consultation requested and recommendations appreciated  Functional quadriplegia  - secondary to progressive failure to thrive and deconditioning  Acute kidney injury  - likely pre renal and now Cr is WNL   Consultants:  None Procedures/Studies:  Dg Chest 2 View 09/17/2013 Left lower lobe atelectasis. No acute cardiopulmonary findings.  Dg Pelvis 1-2 Views 09/17/2013 No acute fracture or dislocation identified about the pelvis.  Dg Femur Left 09/17/2013 No acute fracture or dislocation identified about the left femur. If occult hip fracture is suspected or if the patient is unable to weightbear.  Dg Femur Right 09/17/2013 No acute fracture or dislocation identified about the right femur. If occult hip fracture is suspected or if the patient is unable to weightbear  Ct Head Wo Contrast 09/17/2013 Diffuse cortical atrophy. No acute intracranial abnormality seen.  Ct Pelvis Wo Contrast 09/17/2013 Negative for acute bony or joint abnormality. A remote left sacral ala fracture is seen. Bilateral trochanteric bursitis. Very large stool ball in the rectosigmoid colon compatible with fecal impaction.  Antibiotics:  Rocephin 03/03 --> 3/9  Code Status: DNR  Family Communication: No family at bedside family updated over the phone    Discharge Exam: Filed Vitals:   09/23/13 0425  BP: 151/82  Pulse: 74  Temp: 98.6 F (37 C)  Resp: 20   Filed Vitals:   09/22/13 1407 09/22/13 1838 09/22/13 2051 09/23/13 0425  BP: 136/65 152/79 141/50 151/82  Pulse: 88 80 68 74  Temp: 98.9 F (37.2 C) 98 F (36.7 C) 97.8 F (36.6  C) 98.6 F (37 C)  TempSrc: Axillary Axillary Axillary Oral  Resp: 18 20 24 20   Height:      Weight:      SpO2: 97% 100% 94% 97%    General: Pt is more alert, follows most of the commands appropriately, not in acute distress Cardiovascular: Regular rate and rhythm, S1/S2 +, no murmurs, no rubs, no gallops Respiratory: Clear to auscultation  bilaterally, no wheezing, no crackles, no rhonchi Abdominal: Soft, non tender, non distended, bowel sounds +, no guarding Extremities: no edema, no cyanosis, pulses palpable bilaterally DP and PT  Discharge Instructions  Discharge Orders   Future Orders Complete By Expires   Diet - low sodium heart healthy  As directed    Increase activity slowly  As directed        Medication List         amLODipine 10 MG tablet  Commonly known as:  NORVASC  Take 1 tablet (10 mg total) by mouth daily.     cloNIDine 0.2 mg/24hr patch  Commonly known as:  CATAPRES - Dosed in mg/24 hr  Place 1 patch (0.2 mg total) onto the skin once a week.     hydrALAZINE 50 MG tablet  Commonly known as:  APRESOLINE  Take 1 tablet (50 mg total) by mouth every 8 (eight) hours.     MULTI VITAMIN DAILY PO  Take 15 mLs by mouth daily.     TYLENOL CHILDRENS PO  Take 15 mLs by mouth daily as needed (pain).           Follow-up Information   Schedule an appointment as soon as possible for a visit with [name],[name], MD.   Specialty:  Internal Medicine   Contact information:   [address] [place] [address] [phone]       Follow up with Debbora Presto, MD. (call my cell phone [phone], as needed, If symptoms worsen)    Specialty:  Internal Medicine   Contact information:   [address] [place] [address] [phone]        The results of significant diagnostics from this hospitalization (including imaging, microbiology, ancillary and laboratory) are listed below for reference.     Microbiology: Recent Results (from the past 240 hour(s))  URINE CULTURE     Status: None   Collection Time    09/17/13  3:41 PM      Result Value Ref Range Status   Specimen Description URINE, CATHETERIZED   Final   Special Requests Rocephin   Final   Culture  Setup Time     Final   Value: 09/17/2013 23:27     Performed at [facility]   Colony Count     Final   Value:  >=100,000 COLONIES/ML     Performed at [facility]   Culture     Final   Value: KLEBSIELLA PNEUMONIAE     Performed at [facility]   Report Status 09/19/2013 FINAL   Final   Organism ID, Bacteria KLEBSIELLA PNEUMONIAE   Final  MRSA PCR SCREENING     Status: None   Collection Time    09/17/13  6:57 PM      Result Value Ref Range Status   MRSA by PCR NEGATIVE  NEGATIVE Final   Comment:            The GeneXpert MRSA Assay (FDA     approved for NASAL specimens     only), is one  component of a     comprehensive MRSA colonization     surveillance program. It is not     intended to diagnose MRSA     infection nor to guide or     monitor treatment for     MRSA infections.     Labs: Basic Metabolic Panel:  Recent Labs Lab 09/18/13 0331 09/19/13 0535 09/20/13 0330 09/21/13 0458 09/22/13 0420  NA 140 143 143 144 146  K 4.5 4.2 3.9 4.7 3.8  CL 104 108 107 110 110  CO2 23 25 24 21 22   GLUCOSE 105* 86 85 95 74  BUN 14 13 13 13 14   CREATININE 0.97 1.02 0.98 0.86 1.00  CALCIUM 9.0 8.9 9.1 9.4 9.2   Liver Function Tests:  Recent Labs Lab 09/17/13 1134  AST 24  ALT 10  ALKPHOS 71  BILITOT 0.3  PROT 6.5  ALBUMIN 3.1*   CBC:  Recent Labs Lab 09/17/13 1233 09/19/13 0535 09/20/13 0330 09/21/13 0706 09/22/13 0420  WBC 4.4 3.7* 4.4 5.1 4.1  NEUTROABS 2.9  --   --   --   --   HGB 12.2 10.5* 10.7* 11.6* 11.0*  HCT 36.6 31.5* 32.2* 34.2* 32.3*  MCV 79.0 78.9 78.2 78.1 78.6  PLT 149* 150 201 175 191   Cardiac Enzymes:  Recent Labs Lab 09/17/13 1134  TROPONINI <0.30   BNP: BNP (last 3 results) No results found for this basename: PROBNP,  in the last 8760 hours CBG: No results found for this basename: GLUCAP,  in the last 168 hours   SIGNED: Time coordinating discharge: Over 30 minutes  Debbora Presto, MD  [facility] 09/23/2013, 12:29 PM Pager [phone]  If 7PM-7AM, please contact night-coverage [email or web address] Password  [email or web address]

## 2013-10-09 ENCOUNTER — Ambulatory Visit: Payer: Medicare Other

## 2013-10-11 ENCOUNTER — Ambulatory Visit: Payer: Medicare Other

## 2013-10-29 ENCOUNTER — Ambulatory Visit: Payer: Medicare Other | Admitting: Internal Medicine

## 2014-01-21 ENCOUNTER — Encounter (HOSPITAL_COMMUNITY)
Admission: EM | Disposition: A | Discharge status: Nursing Home (Includes ICF) | Payer: Self-pay | Source: Home / Self Care | Attending: Internal Medicine

## 2014-01-21 ENCOUNTER — Encounter (HOSPITAL_COMMUNITY): Payer: Medicare Other | Admitting: Certified Registered Nurse Anesthetist

## 2014-01-21 ENCOUNTER — Inpatient Hospital Stay (HOSPITAL_COMMUNITY): Payer: Medicare Other

## 2014-01-21 ENCOUNTER — Inpatient Hospital Stay (HOSPITAL_COMMUNITY)
Admission: EM | Admit: 2014-01-21 | Discharge: 2014-01-27 | DRG: 853 | Disposition: A | Discharge status: Other Acute Inpatient Hospital | Payer: Medicare Other | Attending: Internal Medicine | Admitting: Internal Medicine

## 2014-01-21 ENCOUNTER — Emergency Department (HOSPITAL_COMMUNITY): Payer: Medicare Other

## 2014-01-21 ENCOUNTER — Inpatient Hospital Stay (HOSPITAL_COMMUNITY): Payer: Medicare Other | Admitting: Certified Registered Nurse Anesthetist

## 2014-01-21 ENCOUNTER — Encounter (HOSPITAL_COMMUNITY): Payer: Self-pay | Admitting: Radiology

## 2014-01-21 DIAGNOSIS — J9601 Acute respiratory failure with hypoxia: Secondary | ICD-10-CM

## 2014-01-21 DIAGNOSIS — A419 Sepsis, unspecified organism: Principal | ICD-10-CM | POA: Insufficient documentation

## 2014-01-21 DIAGNOSIS — N170 Acute kidney failure with tubular necrosis: Secondary | ICD-10-CM

## 2014-01-21 DIAGNOSIS — L089 Local infection of the skin and subcutaneous tissue, unspecified: Secondary | ICD-10-CM

## 2014-01-21 DIAGNOSIS — I959 Hypotension, unspecified: Secondary | ICD-10-CM

## 2014-01-21 DIAGNOSIS — G309 Alzheimer's disease, unspecified: Secondary | ICD-10-CM | POA: Diagnosis present

## 2014-01-21 DIAGNOSIS — E870 Hyperosmolality and hypernatremia: Secondary | ICD-10-CM

## 2014-01-21 DIAGNOSIS — E162 Hypoglycemia, unspecified: Secondary | ICD-10-CM | POA: Diagnosis not present

## 2014-01-21 DIAGNOSIS — E872 Acidosis, unspecified: Secondary | ICD-10-CM | POA: Diagnosis not present

## 2014-01-21 DIAGNOSIS — E860 Dehydration: Secondary | ICD-10-CM

## 2014-01-21 DIAGNOSIS — R64 Cachexia: Secondary | ICD-10-CM | POA: Diagnosis present

## 2014-01-21 DIAGNOSIS — M79605 Pain in left leg: Secondary | ICD-10-CM

## 2014-01-21 DIAGNOSIS — L89209 Pressure ulcer of unspecified hip, unspecified stage: Secondary | ICD-10-CM | POA: Diagnosis present

## 2014-01-21 DIAGNOSIS — D649 Anemia, unspecified: Secondary | ICD-10-CM | POA: Diagnosis present

## 2014-01-21 DIAGNOSIS — L89154 Pressure ulcer of sacral region, stage 4: Secondary | ICD-10-CM

## 2014-01-21 DIAGNOSIS — D500 Iron deficiency anemia secondary to blood loss (chronic): Secondary | ICD-10-CM

## 2014-01-21 DIAGNOSIS — N183 Chronic kidney disease, stage 3 unspecified: Secondary | ICD-10-CM | POA: Diagnosis present

## 2014-01-21 DIAGNOSIS — I10 Essential (primary) hypertension: Secondary | ICD-10-CM | POA: Diagnosis present

## 2014-01-21 DIAGNOSIS — Z66 Do not resuscitate: Secondary | ICD-10-CM | POA: Diagnosis present

## 2014-01-21 DIAGNOSIS — R627 Adult failure to thrive: Secondary | ICD-10-CM | POA: Diagnosis present

## 2014-01-21 DIAGNOSIS — L89109 Pressure ulcer of unspecified part of back, unspecified stage: Secondary | ICD-10-CM | POA: Diagnosis present

## 2014-01-21 DIAGNOSIS — E861 Hypovolemia: Secondary | ICD-10-CM | POA: Diagnosis present

## 2014-01-21 DIAGNOSIS — L8990 Pressure ulcer of unspecified site, unspecified stage: Secondary | ICD-10-CM

## 2014-01-21 DIAGNOSIS — L8994 Pressure ulcer of unspecified site, stage 4: Secondary | ICD-10-CM | POA: Diagnosis present

## 2014-01-21 DIAGNOSIS — Z87440 Personal history of urinary (tract) infections: Secondary | ICD-10-CM

## 2014-01-21 DIAGNOSIS — N390 Urinary tract infection, site not specified: Secondary | ICD-10-CM

## 2014-01-21 DIAGNOSIS — N179 Acute kidney failure, unspecified: Secondary | ICD-10-CM | POA: Diagnosis present

## 2014-01-21 DIAGNOSIS — I129 Hypertensive chronic kidney disease with stage 1 through stage 4 chronic kidney disease, or unspecified chronic kidney disease: Secondary | ICD-10-CM | POA: Diagnosis present

## 2014-01-21 DIAGNOSIS — Z833 Family history of diabetes mellitus: Secondary | ICD-10-CM

## 2014-01-21 DIAGNOSIS — R6520 Severe sepsis without septic shock: Secondary | ICD-10-CM

## 2014-01-21 DIAGNOSIS — L8992 Pressure ulcer of unspecified site, stage 2: Secondary | ICD-10-CM | POA: Diagnosis present

## 2014-01-21 DIAGNOSIS — F0280 Dementia in other diseases classified elsewhere without behavioral disturbance: Secondary | ICD-10-CM | POA: Diagnosis present

## 2014-01-21 DIAGNOSIS — J9600 Acute respiratory failure, unspecified whether with hypoxia or hypercapnia: Secondary | ICD-10-CM | POA: Diagnosis not present

## 2014-01-21 DIAGNOSIS — Z800 Family history of malignant neoplasm of digestive organs: Secondary | ICD-10-CM

## 2014-01-21 DIAGNOSIS — Z823 Family history of stroke: Secondary | ICD-10-CM

## 2014-01-21 DIAGNOSIS — E43 Unspecified severe protein-calorie malnutrition: Secondary | ICD-10-CM | POA: Diagnosis present

## 2014-01-21 DIAGNOSIS — Z7409 Other reduced mobility: Secondary | ICD-10-CM

## 2014-01-21 HISTORY — PX: IRRIGATION AND DEBRIDEMENT ABSCESS: SHX5252

## 2014-01-21 LAB — CBC WITH DIFFERENTIAL/PLATELET
Basophils Absolute: 0 10*3/uL (ref 0.0–0.1)
Basophils Relative: 0 % (ref 0–1)
Eosinophils Absolute: 0 10*3/uL (ref 0.0–0.7)
Eosinophils Relative: 0 % (ref 0–5)
HCT: 34.5 % — ABNORMAL LOW (ref 36.0–46.0)
Hemoglobin: 11.1 g/dL — ABNORMAL LOW (ref 12.0–15.0)
LYMPHS ABS: 1.6 10*3/uL (ref 0.7–4.0)
LYMPHS PCT: 9 % — AB (ref 12–46)
MCH: 26.4 pg (ref 26.0–34.0)
MCHC: 32.2 g/dL (ref 30.0–36.0)
MCV: 82.1 fL (ref 78.0–100.0)
MONOS PCT: 7 % (ref 3–12)
Monocytes Absolute: 1.4 10*3/uL — ABNORMAL HIGH (ref 0.1–1.0)
NEUTROS ABS: 15.7 10*3/uL — AB (ref 1.7–7.7)
NEUTROS PCT: 84 % — AB (ref 43–77)
Platelets: 295 10*3/uL (ref 150–400)
RBC: 4.2 MIL/uL (ref 3.87–5.11)
RDW: 13.9 % (ref 11.5–15.5)
WBC: 18.7 10*3/uL — AB (ref 4.0–10.5)

## 2014-01-21 LAB — COMPREHENSIVE METABOLIC PANEL
ALK PHOS: 129 U/L — AB (ref 39–117)
ALT: 130 U/L — ABNORMAL HIGH (ref 0–35)
AST: 101 U/L — ABNORMAL HIGH (ref 0–37)
Albumin: 2.2 g/dL — ABNORMAL LOW (ref 3.5–5.2)
Anion gap: 15 (ref 5–15)
BILIRUBIN TOTAL: 0.5 mg/dL (ref 0.3–1.2)
BUN: 45 mg/dL — AB (ref 6–23)
CHLORIDE: 117 meq/L — AB (ref 96–112)
CO2: 25 meq/L (ref 19–32)
Calcium: 9 mg/dL (ref 8.4–10.5)
Creatinine, Ser: 1.64 mg/dL — ABNORMAL HIGH (ref 0.50–1.10)
GFR calc Af Amer: 31 mL/min — ABNORMAL LOW (ref >90)
GFR calc non Af Amer: 27 mL/min — ABNORMAL LOW (ref >90)
GLUCOSE: 170 mg/dL — AB (ref 70–99)
POTASSIUM: 4.3 meq/L (ref 3.7–5.3)
Sodium: 157 mEq/L — ABNORMAL HIGH (ref 137–147)
Total Protein: 6.7 g/dL (ref 6.0–8.3)

## 2014-01-21 LAB — URINALYSIS, ROUTINE W REFLEX MICROSCOPIC
Bilirubin Urine: NEGATIVE
GLUCOSE, UA: NEGATIVE mg/dL
Hgb urine dipstick: NEGATIVE
KETONES UR: NEGATIVE mg/dL
Nitrite: NEGATIVE
PH: 7 (ref 5.0–8.0)
Protein, ur: 30 mg/dL — AB
SPECIFIC GRAVITY, URINE: 1.018 (ref 1.005–1.030)
Urobilinogen, UA: 0.2 mg/dL (ref 0.0–1.0)

## 2014-01-21 LAB — PROTIME-INR
INR: 1.18 (ref 0.00–1.49)
PROTHROMBIN TIME: 15 s (ref 11.6–15.2)

## 2014-01-21 LAB — CREATININE, SERUM
Creatinine, Ser: 1.56 mg/dL — ABNORMAL HIGH (ref 0.50–1.10)
GFR calc Af Amer: 33 mL/min — ABNORMAL LOW (ref >90)
GFR, EST NON AFRICAN AMERICAN: 29 mL/min — AB (ref >90)

## 2014-01-21 LAB — URINE MICROSCOPIC-ADD ON

## 2014-01-21 LAB — CBC
HEMATOCRIT: 31.7 % — AB (ref 36.0–46.0)
HEMOGLOBIN: 10.1 g/dL — AB (ref 12.0–15.0)
MCH: 26.1 pg (ref 26.0–34.0)
MCHC: 31.9 g/dL (ref 30.0–36.0)
MCV: 81.9 fL (ref 78.0–100.0)
Platelets: 234 10*3/uL (ref 150–400)
RBC: 3.87 MIL/uL (ref 3.87–5.11)
RDW: 13.9 % (ref 11.5–15.5)
WBC: 15 10*3/uL — ABNORMAL HIGH (ref 4.0–10.5)

## 2014-01-21 LAB — CBG MONITORING, ED: Glucose-Capillary: 138 mg/dL — ABNORMAL HIGH (ref 70–99)

## 2014-01-21 LAB — PROCALCITONIN: Procalcitonin: 1.12 ng/mL

## 2014-01-21 LAB — SEDIMENTATION RATE: Sed Rate: 118 mm/hr — ABNORMAL HIGH (ref 0–22)

## 2014-01-21 LAB — TROPONIN I: Troponin I: <0.3 ng/mL (ref <0.30)

## 2014-01-21 LAB — GLUCOSE, CAPILLARY: Glucose-Capillary: 114 mg/dL — ABNORMAL HIGH (ref 70–99)

## 2014-01-21 LAB — I-STAT CG4 LACTIC ACID, ED: Lactic Acid, Venous: 2.61 mmol/L — ABNORMAL HIGH (ref 0.5–2.2)

## 2014-01-21 SURGERY — IRRIGATION AND DEBRIDEMENT ABSCESS
Anesthesia: General | Site: Back

## 2014-01-21 MED ORDER — SODIUM CHLORIDE 0.9 % IV SOLN
0.0000 ug/h | INTRAVENOUS | Status: DC
  Administered 2014-01-22: 50 ug/h via INTRAVENOUS
  Filled 2014-01-21: qty 50

## 2014-01-21 MED ORDER — LIDOCAINE HCL (CARDIAC) 20 MG/ML IV SOLN
INTRAVENOUS | Status: AC
  Filled 2014-01-21: qty 5

## 2014-01-21 MED ORDER — VANCOMYCIN HCL IN DEXTROSE 1-5 GM/200ML-% IV SOLN
1000.0000 mg | Freq: Once | INTRAVENOUS | Status: AC
  Administered 2014-01-21: 1000 mg via INTRAVENOUS
  Filled 2014-01-21: qty 200

## 2014-01-21 MED ORDER — PHENYLEPHRINE HCL 10 MG/ML IJ SOLN
10.0000 mg | INTRAMUSCULAR | Status: DC | PRN
  Administered 2014-01-21: 100 ug/min via INTRAVENOUS

## 2014-01-21 MED ORDER — SODIUM CHLORIDE 0.9 % IV SOLN
INTRAVENOUS | Status: DC
  Administered 2014-01-21: 19:00:00 via INTRAVENOUS

## 2014-01-21 MED ORDER — ARTIFICIAL TEARS OP OINT
TOPICAL_OINTMENT | OPHTHALMIC | Status: AC
  Filled 2014-01-21: qty 3.5

## 2014-01-21 MED ORDER — ETOMIDATE 2 MG/ML IV SOLN
INTRAVENOUS | Status: DC | PRN
  Administered 2014-01-21: 18 mg via INTRAVENOUS

## 2014-01-21 MED ORDER — ACETAMINOPHEN 650 MG RE SUPP
650.0000 mg | Freq: Once | RECTAL | Status: AC
  Administered 2014-01-21: 650 mg via RECTAL
  Filled 2014-01-21: qty 1

## 2014-01-21 MED ORDER — LIDOCAINE HCL (CARDIAC) 20 MG/ML IV SOLN
INTRAVENOUS | Status: DC | PRN
  Administered 2014-01-21: 60 mg via INTRAVENOUS

## 2014-01-21 MED ORDER — MORPHINE SULFATE 2 MG/ML IJ SOLN
2.0000 mg | Freq: Once | INTRAMUSCULAR | Status: AC
  Administered 2014-01-21: 2 mg via INTRAVENOUS
  Filled 2014-01-21: qty 1

## 2014-01-21 MED ORDER — SODIUM CHLORIDE 0.9 % IV BOLUS (SEPSIS)
30.0000 mL/kg | Freq: Once | INTRAVENOUS | Status: AC
  Administered 2014-01-21: 2000 mL via INTRAVENOUS

## 2014-01-21 MED ORDER — HEPARIN SODIUM (PORCINE) 5000 UNIT/ML IJ SOLN
5000.0000 [IU] | Freq: Three times a day (TID) | INTRAMUSCULAR | Status: DC
  Administered 2014-01-22: 5000 [IU] via SUBCUTANEOUS
  Filled 2014-01-21 (×4): qty 1

## 2014-01-21 MED ORDER — FENTANYL CITRATE 0.05 MG/ML IJ SOLN
INTRAMUSCULAR | Status: DC | PRN
  Administered 2014-01-21 (×4): 25 ug via INTRAVENOUS

## 2014-01-21 MED ORDER — SODIUM CHLORIDE 0.9 % IJ SOLN
3.0000 mL | Freq: Two times a day (BID) | INTRAMUSCULAR | Status: DC
Start: 2014-01-21 — End: 2014-01-27
  Administered 2014-01-22 – 2014-01-27 (×10): 3 mL via INTRAVENOUS

## 2014-01-21 MED ORDER — PANTOPRAZOLE SODIUM 40 MG IV SOLR
40.0000 mg | Freq: Every day | INTRAVENOUS | Status: DC
  Administered 2014-01-22: 40 mg via INTRAVENOUS
  Filled 2014-01-21: qty 40

## 2014-01-21 MED ORDER — SODIUM CHLORIDE 0.9 % IV SOLN: 1000.0000 mL | INTRAVENOUS | Status: DC

## 2014-01-21 MED ORDER — 0.9 % SODIUM CHLORIDE (POUR BTL) OPTIME
TOPICAL | Status: DC | PRN
  Administered 2014-01-21: 3000 mL
  Administered 2014-01-21: 1000 mL

## 2014-01-21 MED ORDER — DAKINS (1/2 STRENGTH) 0.25 % EX SOLN
Freq: Once | CUTANEOUS | Status: AC
  Administered 2014-01-21: 1
  Filled 2014-01-21: qty 473

## 2014-01-21 MED ORDER — BIOTENE DRY MOUTH MT LIQD
15.0000 mL | Freq: Four times a day (QID) | OROMUCOSAL | Status: DC
  Administered 2014-01-22 – 2014-01-27 (×22): 15 mL via OROMUCOSAL

## 2014-01-21 MED ORDER — ONDANSETRON HCL 4 MG/2ML IJ SOLN
INTRAMUSCULAR | Status: AC
  Filled 2014-01-21: qty 2

## 2014-01-21 MED ORDER — VANCOMYCIN HCL IN DEXTROSE 1-5 GM/200ML-% IV SOLN: 1000.0000 mg | INTRAVENOUS | Status: DC

## 2014-01-21 MED ORDER — FENTANYL CITRATE 0.05 MG/ML IJ SOLN
50.0000 ug | Freq: Once | INTRAMUSCULAR | Status: AC
  Administered 2014-01-22: 50 ug via INTRAVENOUS

## 2014-01-21 MED ORDER — FENTANYL CITRATE 0.05 MG/ML IJ SOLN
INTRAMUSCULAR | Status: AC
  Filled 2014-01-21: qty 5

## 2014-01-21 MED ORDER — PROPOFOL 10 MG/ML IV BOLUS
INTRAVENOUS | Status: AC
Start: 2014-01-21 — End: 2014-01-21
  Filled 2014-01-21: qty 20

## 2014-01-21 MED ORDER — CHLORHEXIDINE GLUCONATE 0.12 % MT SOLN
15.0000 mL | Freq: Two times a day (BID) | OROMUCOSAL | Status: DC
  Administered 2014-01-22 – 2014-01-27 (×12): 15 mL via OROMUCOSAL
  Filled 2014-01-21 (×13): qty 15

## 2014-01-21 MED ORDER — ONDANSETRON HCL 4 MG/2ML IJ SOLN
INTRAMUSCULAR | Status: DC | PRN
  Administered 2014-01-21: 4 mg via INTRAVENOUS

## 2014-01-21 MED ORDER — SUCCINYLCHOLINE CHLORIDE 20 MG/ML IJ SOLN
INTRAMUSCULAR | Status: DC | PRN
  Administered 2014-01-21: 80 mg via INTRAVENOUS

## 2014-01-21 MED ORDER — ARTIFICIAL TEARS OP OINT
TOPICAL_OINTMENT | OPHTHALMIC | Status: DC | PRN
  Administered 2014-01-21: 1 via OPHTHALMIC

## 2014-01-21 MED ORDER — FENTANYL BOLUS VIA INFUSION
25.0000 ug | INTRAVENOUS | Status: DC | PRN
  Filled 2014-01-21: qty 50

## 2014-01-21 MED ORDER — PIPERACILLIN-TAZOBACTAM IN DEX 2-0.25 GM/50ML IV SOLN
2.2500 g | Freq: Four times a day (QID) | INTRAVENOUS | Status: DC
  Administered 2014-01-21 – 2014-01-27 (×23): 2.25 g via INTRAVENOUS
  Filled 2014-01-21 (×26): qty 50

## 2014-01-21 MED ORDER — SODIUM CHLORIDE 0.9 % IV SOLN
INTRAVENOUS | Status: DC
Start: 2014-01-21 — End: 2014-01-22
  Administered 2014-01-21: 22:00:00 via INTRAVENOUS

## 2014-01-21 MED ORDER — PIPERACILLIN-TAZOBACTAM 3.375 G IVPB 30 MIN
3.3750 g | Freq: Once | INTRAVENOUS | Status: AC
  Administered 2014-01-21: 3.375 g via INTRAVENOUS
  Filled 2014-01-21: qty 50

## 2014-01-21 MED ORDER — MORPHINE SULFATE 2 MG/ML IJ SOLN
1.0000 mg | INTRAMUSCULAR | Status: DC | PRN
  Administered 2014-01-21: 2 mg via INTRAVENOUS
  Filled 2014-01-21: qty 1

## 2014-01-21 MED ORDER — HYDRALAZINE HCL 20 MG/ML IJ SOLN: 5.0000 mg | INTRAMUSCULAR | Status: DC | PRN

## 2014-01-21 SURGICAL SUPPLY — 33 items
BANDAGE GAUZE ELAST BULKY 4 IN (GAUZE/BANDAGES/DRESSINGS) IMPLANT
BNDG GAUZE ELAST 4 BULKY (GAUZE/BANDAGES/DRESSINGS) ×3 IMPLANT
CANISTER SUCTION 2500CC (MISCELLANEOUS) ×3 IMPLANT
COVER SURGICAL LIGHT HANDLE (MISCELLANEOUS) ×3 IMPLANT
DRAPE LAPAROSCOPIC ABDOMINAL (DRAPES) IMPLANT
DRAPE PED LAPAROTOMY (DRAPES) ×3 IMPLANT
DRAPE UTILITY 15X26 W/TAPE STR (DRAPE) ×6 IMPLANT
DRSG PAD ABDOMINAL 8X10 ST (GAUZE/BANDAGES/DRESSINGS) ×3 IMPLANT
ELECT CAUTERY BLADE 6.4 (BLADE) ×3 IMPLANT
ELECT REM PT RETURN 9FT ADLT (ELECTROSURGICAL) ×3
ELECTRODE REM PT RTRN 9FT ADLT (ELECTROSURGICAL) ×1 IMPLANT
GLOVE BIO SURGEON STRL SZ7.5 (GLOVE) ×3 IMPLANT
GLOVE BIOGEL PI IND STRL 6.5 (GLOVE) ×1 IMPLANT
GLOVE BIOGEL PI IND STRL 8 (GLOVE) ×1 IMPLANT
GLOVE BIOGEL PI INDICATOR 6.5 (GLOVE) ×2
GLOVE BIOGEL PI INDICATOR 8 (GLOVE) ×2
GLOVE ECLIPSE 6.5 STRL STRAW (GLOVE) ×3 IMPLANT
GOWN STRL REUS W/ TWL LRG LVL3 (GOWN DISPOSABLE) ×1 IMPLANT
GOWN STRL REUS W/ TWL XL LVL3 (GOWN DISPOSABLE) ×1 IMPLANT
GOWN STRL REUS W/TWL LRG LVL3 (GOWN DISPOSABLE) ×2
GOWN STRL REUS W/TWL XL LVL3 (GOWN DISPOSABLE) ×2
HANDPIECE INTERPULSE COAX TIP (DISPOSABLE) ×2
KIT BASIN OR (CUSTOM PROCEDURE TRAY) ×3 IMPLANT
KIT ROOM TURNOVER OR (KITS) ×3 IMPLANT
NS IRRIG 1000ML POUR BTL (IV SOLUTION) ×3 IMPLANT
PACK GENERAL/GYN (CUSTOM PROCEDURE TRAY) ×3 IMPLANT
PAD ARMBOARD 7.5X6 YLW CONV (MISCELLANEOUS) ×3 IMPLANT
SET HNDPC FAN SPRY TIP SCT (DISPOSABLE) ×1 IMPLANT
SPONGE GAUZE 4X4 12PLY (GAUZE/BANDAGES/DRESSINGS) ×3 IMPLANT
SWAB COLLECTION DEVICE MRSA (MISCELLANEOUS) ×3 IMPLANT
TOWEL OR 17X24 6PK STRL BLUE (TOWEL DISPOSABLE) ×3 IMPLANT
TOWEL OR 17X26 10 PK STRL BLUE (TOWEL DISPOSABLE) ×3 IMPLANT
TUBE ANAEROBIC SPECIMEN COL (MISCELLANEOUS) ×3 IMPLANT

## 2014-01-21 NOTE — ED Notes (Signed)
Inserted foley catheter in pt with witness at bedside. Witness was Curley SpiceStephen Mitchell NT

## 2014-01-21 NOTE — ED Notes (Signed)
Pt placed on monitor upon return to room from CT scan. Pt monitored  By 5 lead, blood pressure, and pulse ox.

## 2014-01-21 NOTE — Consult Note (Signed)
PULMONARY / CRITICAL CARE MEDICINE   Name: Michelle Hooper MRN: [id number] DOB: September 08, 1924    ADMISSION DATE:  01/21/2014 CONSULTATION DATE:  01/21/14  REFERRING MD :  Ralene Ok (General Surgery) PRIMARY SERVICE: Hospitalist - Shanda Howells, MD---->pccm  CHIEF COMPLAINT:  Unable to wean off of mechanical ventilation post-op  BRIEF PATIENT DESCRIPTION: 80 y.o F with PMH chronic dementia, HTN, presented with sepsis likely secondary to large necrotic stage-IV sacral decub ulcer with subcutaneous air and concern for nec fasc on CT. Surgery performed Irrigate/Debride in OR on 7/7. Intubated in OR for procedure, unable to wean off vent in PACU. PCCM consulted for continued mech vent management.  SIGNIFICANT EVENTS / STUDIES:  7/7 - Admitted. OR for irrigation/debride sacral decub 8x9 cm. Intubated, unable to wean in PACU. 7/7 - CT Abd / Pelvis >> midline sacral ulcer, superimposed subq emphysema, concern superimposed nec fasc, without discrete abscess  LINES / TUBES: ETT 7/7 >> OGT 7/7 >> PIVs  CULTURES: Blood x2, 7/7 >> Urine 7/7 >>  ANTIBIOTICS: Vanc 7/7 >> Zosyn 7/7 >> Clindamycin (toxin inh) 7/7>>>  HISTORY OF PRESENT ILLNESS:  Patient unable to provide HPI, due to currently sedated from recent surgery and intubated and unable to provide HPI. Additionally with history of chronic dementia.  Per chart review, history provided primarily by patient's daughter (primary caregiver). Recent 2 day history of overall functional decline, decreased PO intake, agitation, concern for infected sacral wound worsening over past several days with patient scratching at it. Called EMS, in ED found to have fever to 103.9, tachycardic up to 120s, tachypnea to 40s, no O2 req. Work-up with significantly elevated WBC 18.7, elevated Cr to 1.64, lactate 2.61, ESR 118, PCT 1.12, Troponin-I (negative), PT/INR 1.50 / 1.18, CXR (stable, no infiltrate), Head CT (no acute findings). Surgery consulted in  ED.  PAST MEDICAL HISTORY :  Past Medical History  Diagnosis Date  . SBO (small bowel obstruction)   . Hypertension   . Alzheimer's dementia   . Iron deficiency anemia   . Frequent UTI    Past Surgical History  Procedure Laterality Date  . Exploratory laparotomy, lysis of adhesions, small bowel  07/30/10   Prior to Admission medications   Medication Sig Start Date End Date Taking? Authorizing Provider  Acetaminophen (TYLENOL CHILDRENS PO) Take 15 mLs by mouth daily as needed (pain).   Yes Historical Provider, MD  amLODipine (NORVASC) 10 MG tablet Take 1 tablet (10 mg total) by mouth daily. 09/22/13  Yes Theodis Blaze, MD  cloNIDine (CATAPRES - DOSED IN MG/24 HR) 0.2 mg/24hr patch Place 1 patch (0.2 mg total) onto the skin once a week. 09/22/13  Yes Theodis Blaze, MD  hydrALAZINE (APRESOLINE) 50 MG tablet Take 1 tablet (50 mg total) by mouth every 8 (eight) hours. 09/22/13  Yes Theodis Blaze, MD  Multiple Vitamin (MULTI VITAMIN DAILY PO) Take 15 mLs by mouth daily.   Yes Historical Provider, MD   No Known Allergies  FAMILY HISTORY:  Family History  Problem Relation Age of Onset  . Cancer Son     Colon cancer  . Diabetes Son   . Diabetes Sister   . Hypertension Sister   . Stroke Sister    SOCIAL HISTORY:  reports that she has never smoked. She has never used smokeless tobacco. She reports that she does not drink alcohol. Her drug history is not on file.  REVIEW OF SYSTEMS:  Unable to obtain from patient.  SUBJECTIVE: Sedated from recent general  anesthesia, on mech ventilation. Responds to painful stimuli.  VITAL SIGNS: Temp:  [98.5 F (36.9 C)-103.9 F (39.9 C)] 98.5 F (36.9 C) (07/07 1900) Pulse Rate:  [91-120] 91 (07/07 1945) Resp:  [16-44] 30 (07/07 1945) BP: (101-145)/(58-76) 110/59 mmHg (07/07 1945) SpO2:  [97 %-100 %] 98 % (07/07 1945) Weight:  [123 lb 14.4 oz (56.2 kg)] 123 lb 14.4 oz (56.2 kg) (07/07 1715) HEMODYNAMICS:   VENTILATOR SETTINGS:   INTAKE /  OUTPUT: Intake/Output     07/07 0701 - 07/08 0700   I.V. (mL/kg) 600 (10.7)   Total Intake(mL/kg) 600 (10.7)   Urine (mL/kg/hr) 100   Blood 30   Total Output 130   Net +470         PHYSICAL EXAMINATION: General:  Elderly, thin, NAD Neuro:  Sedated, withdraws to pain, hand grasping,  HEENT:  OGT, ETT in place, PERRL, dry MM Cardiovascular:  RRR, no murmurs Lungs:  Generalized mild coarse breath sounds. No focal crackles. No wheezing. Stable on mech vent Abdomen:  Soft, NTND, +active BS Skin:  Midline sacral decub ulcer (stage IV) s/p surgical irrigation / debridement, dressing in place Ext: warm, non-tender, no edema, symmetrical  LABS:  CBC  Recent Labs Lab 01/21/14 1645 01/21/14 2000  WBC 18.7* 15.0*  HGB 11.1* 10.1*  HCT 34.5* 31.7*  PLT 295 234   Coag's  Recent Labs Lab 01/21/14 1645  INR 1.18   BMET  Recent Labs Lab 01/21/14 1645 01/21/14 2000  NA 157*  --   K 4.3  --   CL 117*  --   CO2 25  --   BUN 45*  --   CREATININE 1.64* 1.56*  GLUCOSE 170*  --    Electrolytes  Recent Labs Lab 01/21/14 1645  CALCIUM 9.0   Sepsis Markers  Recent Labs Lab 01/21/14 1645 01/21/14 1653  LATICACIDVEN  --  2.61*  PROCALCITON 1.12  --    ABG No results found for this basename: PHART, PCO2ART, PO2ART,  in the last 168 hours Liver Enzymes  Recent Labs Lab 01/21/14 1645  AST 101*  ALT 130*  ALKPHOS 129*  BILITOT 0.5  ALBUMIN 2.2*   Cardiac Enzymes  Recent Labs Lab 01/21/14 1645  TROPONINI <0.30   Glucose  Recent Labs Lab 01/21/14 1522 01/21/14 2215  GLUCAP 138* 114*    Imaging Ct Abdomen Pelvis Wo Contrast  01/21/2014   CLINICAL DATA:  Abdominal pain, fever, sacral ulcers.  EXAM: CT ABDOMEN AND PELVIS WITHOUT CONTRAST  TECHNIQUE: Multidetector CT imaging of the abdomen and pelvis was performed following the standard protocol without IV contrast.  COMPARISON:  Abdominal radiograph January 21, 2014 and CT of the pelvis September 17, 2013   FINDINGS: Mild motion degraded examination.  LUNG BASES: Included view of the lung bases demonstrates left lung base pleural thickening atelectasis with granuloma. The heart appears mildly enlarged, with annular calcifications, included pericardium is nonsuspicious.  KIDNEYS/BLADDER: Kidneys are orthotopic, demonstrating normal size and morphology. No nephrolithiasis, hydronephrosis; limited assessment for renal masses on this nonenhanced examination. The unopacified ureters are normal in course and caliber. Urinary bladder is partially distended, containing a Foley bulb and air.  SOLID ORGANS: The liver, spleen, pancreas and adrenal glands are unremarkable for this non-contrast examination. Status post cholecystectomy  GASTROINTESTINAL TRACT: The stomach, small and large bowel are normal in course and caliber without inflammatory changes, the sensitivity may be decreased by lack of enteric contrast. Moderate amount of retained large bowel stool to the level of  the rectal vault, no bowel obstruction.  PERITONEUM/RETROPERITONEUM: No intraperitoneal free fluid nor free air. Ectatic infrarenal aorta to 2 cm, with severe calcific atherosclerosis. No lymphadenopathy by CT size criteria. Internal reproductive organs are unremarkable.  SOFT TISSUES/ OSSEOUS STRUCTURES: Inflammatory changes of the left gluteal sulcus with subcutaneous gas along the sacral soft tissues extending into the left gluteus musculature. Probable midline sacral ulcer, in addition to inflammatory changes of the skin and subcutaneous fat without discrete fluid collection.  Suture material along anterior abdominal wall. Patient is osteopenic. Severe degenerative change of the sacroiliac joints.  IMPRESSION: Inflammatory change of the gluteal soft tissues with suspected midline sacral ulcer, superimposed subcutaneous emphysema extending into the left gluteal musculature ; though this could reflect deep extent of ulceration, superimposed necrotizing  fasciitis could have a similar appearance. No intraperitoneal extent. No discrete abscess by noncontrast CT.  Moderate amount of retained large bowel stool without obstruction.  Findings discussed with and reconfirmed by Dr.STEPHEN RANCOUR on7/7/2015at6:59 PM.   Electronically Signed   By: Elon Alas   On: 01/21/2014 19:00   Dg Chest 1 View  01/21/2014   CLINICAL DATA:  Altered mental status.  EXAM: CHEST - 1 VIEW  COMPARISON:  09/17/2013  FINDINGS: Stable elevation of the left hemidiaphragm. Cardiomegaly. Chronic densities at the left lung base, likely scarring. Right lung is clear. No effusions. No acute bony abnormality.  Stable impression on the right side of the trachea, likely tortuous vasculature or right thyroid goiter.  IMPRESSION: Left basilar scarring. Stable chronic elevation of the left hemidiaphragm. Mild cardiomegaly. No acute findings.   Electronically Signed   By: Rolm Baptise M.D.   On: 01/21/2014 16:25   Dg Abd 1 View  01/21/2014   CLINICAL DATA:  Altered mental status.  EXAM: ABDOMEN - 1 VIEW  COMPARISON:  CT 09/17/2013  FINDINGS: Nonobstructive bowel gas pattern. Moderate stool throughout the colon. Mild gaseous distention of the colon. No organomegaly or suspicious calcification. Dense vascular calcifications throughout the aorta and iliac vessels. No visible aneurysm.  Degenerative changes in the hips bilaterally.  Diffuse osteopenia.  IMPRESSION: No evidence of bowel obstruction or free air. Large stool burden with mild gaseous distention of the colon.   Electronically Signed   By: Rolm Baptise M.D.   On: 01/21/2014 16:26   Ct Head Wo Contrast  01/21/2014   CLINICAL DATA:  Altered mental status  EXAM: CT HEAD WITHOUT CONTRAST  TECHNIQUE: Contiguous axial images were obtained from the base of the skull through the vertex without intravenous contrast.  COMPARISON:  September 17, 2013  FINDINGS: There is moderately severe diffuse atrophy, stable. There is no appreciable mass, hemorrhage,  extra-axial fluid collection, or midline shift. There is mild small vessel disease in the centra semiovale bilaterally. No acute appearing infarct identified.  Bony calvarium appears intact.  The mastoid air cells are clear.  IMPRESSION: Diffuse atrophy with mild periventricular small vessel disease, stable. No intracranial mass, hemorrhage, or acute appearing infarct.   Electronically Signed   By: Lowella Grip M.D.   On: 01/21/2014 16:26     CXR: hazziness LLL, ett wnl  ASSESSMENT / PLAN:  PULMONARY A: Acute resp failure (unable to wean, extubate in pacu) P:   STAT pcxr ett wnl abg now on current MV Likely wean am cpap5 ps 5, goal 30 min  Start now 8 cc/kg  CARDIOVASCULAR A: severe sepsis, hypovolemia, HTN controlled P:  Repeat lactic acid level Dc hydralazine 1/2 NS Pos balance goals If  drop sBP will place cvl and assess cvp, and start levophed Cortisol if drops BP Tele Low threshold addition clonidine in am to avoid rebound  RENAL A:  Hypernatremia, lactic acidosis P:   Change top 1/2 NS, chem in am  Pos balance goals Repeat lactic  GASTROINTESTINAL A:  Post op P:   NPO ppi LFt in am   HEMATOLOGIC A:  DVt prevention P:  sub q hep when able, per surgery Cbc in am , likely will have some dilution  INFECTIOUS A:  Infected Sacral decub, r/o nec fasc, r/o osteo P:   D/w surgery if bone BX done / involvement? Continued zosyn, vanc Add clinda for toxin inhibition for now, if BP wnl throughout night, dc NO role PCT  ENDOCRINE A:  At risk hyperglycemia P:   Add ssi Cortisol if drops BP  NEUROLOGIC A:  Baseline dimentia, pain, vent dyschrony P:   RASS goal: -1 Requires fent drip Avoid benzo  TODAY'S SUMMARY: ABG now, add clinda, wean in am , follow BP closely, may need cvl  I have personally obtained a history, examined the patient, evaluated laboratory and imaging results, formulated the assessment and plan and placed orders. CRITICAL CARE: The  patient is critically ill with multiple organ systems failure and requires high complexity decision making for assessment and support, frequent evaluation and titration of therapies, application of advanced monitoring technologies and extensive interpretation of multiple databases. Critical Care Time devoted to patient care services described in this note is 35 minutes.    Nobie Putnam, [place], PGY-2  01/21/2014, 11:21 PM   I have fully examined this patient and agree with above findings.      Lavon Paganini. Titus Mould, MD, [place] Pgr: [facility]

## 2014-01-21 NOTE — Anesthesia Procedure Notes (Signed)
Procedure Name: Intubation Date/Time: 01/21/2014 8:44 PM Performed by: Julianne RiceBILOTTA, Latasha Buczkowski Z Pre-anesthesia Checklist: Patient identified, Timeout performed, Emergency Drugs available, Suction available and Patient being monitored Patient Re-evaluated:Patient Re-evaluated prior to inductionOxygen Delivery Method: Circle system utilized Preoxygenation: Pre-oxygenation with 100% oxygen Intubation Type: IV induction and Cricoid Pressure applied Ventilation: Mask ventilation without difficulty Laryngoscope Size: Mac and 3 Grade View: Grade I Tube type: Oral Tube size: 7.0 mm Number of attempts: 1 Airway Equipment and Method: Stylet Placement Confirmation: ETT inserted through vocal cords under direct vision,  breath sounds checked- equal and bilateral and positive ETCO2 Secured at: 22 cm Tube secured with: Tape Dental Injury: Teeth and Oropharynx as per pre-operative assessment

## 2014-01-21 NOTE — ED Notes (Signed)
Floor will call back when finished with shift reporting

## 2014-01-21 NOTE — Op Note (Signed)
PATIENT: Michelle Hooper 80 y.o. female  PRE-OPERATIVE DIAGNOSIS: Sacral Abscess  POST-OPERATIVE DIAGNOSIS: Sacral Decubitus ulcer  PROCEDURE: Procedure(s):  IRRIGATION AND DEBRIDEMENT Sacral Decubitus Ulcer (N/A)  SURGEON: Surgeon(s) and Role:  * Axel FillerArmando Gurtej Noyola, MD - Primary  PHYSICIAN ASSISTANT:  ASSISTANTS: none  ANESTHESIA: general  EBL: Total I/O  In: -  Out: 80 [Urine:50; Blood:30]  BLOOD ADMINISTERED:none  DRAINS: none  LOCAL MEDICATIONS USED: NONE  SPECIMEN: No Specimen  DISPOSITION OF SPECIMEN: N/A  COUNTS: YES  TOURNIQUET: * No tourniquets in log *  DICTATION: .Dragon Dictation  After the patient was consented she was taken back to the operative placed in the lateral decubitus position. Patient was prepped and draped in usual sterile fashion. A timeout was called and all facts were verified.  A circumferential debridement of the sacral wound was undertaken sharply with a 10 blade. All necrotic skin including subcutaneous tissue was debrided. It should be noted that the sacrum was exposed prior to the beginning of debridement. A curette was used to help debride some of the subcutaneous tissue. At this time pulse irrigation was used to clean out the wound. The final measurements of the wound measured approximately 8 x 9 cm in size. Hemostasis was achieved using Bovie cautery. The wound was packed with Kerlix soaked in quarter percent Dakin solution. It was dressed with 4 x 4's ABD pads and tape.  The patient tolerated the procedure well was taken to recovery stable condition.  2. Tool used for debridement (curette, scapel, etc.): scalpel, curette, electrocautery  3. Frequency of surgical debridement. First debridement  4. Measurement of total devitalized tissue (wound surface) before and after surgical debridement. Before-approximately6 x 8 cm; after-approximately 8 x 9 cm  5. Area and depth of devitalized tissue removed from wound. Approximately 3 cm in maximum depth  6. Blood  loss and description of tissue removed. EBL-10 cc. Necrotic removal of dermis and subcutaneous tissue  7. Evidence of the progress of the wound's response to treatment.  A. Current wound volume (current dimensions and depth). 8 x 9 x 3 cm  B. Presence (and extent of) of infection. Surrounding subcutaneous chronic infection  C. Presence (and extent of) of non viable tissue. Original 6 x 8 cm of necrotic dermal and subcutaneous tissue  D. Other material in the wound that is expected to inhibit healing. infection  8. Was there any viable tissue removed (measurements): no  PLAN OF CARE: Admit to inpatient  PATIENT DISPOSITION: PACU - hemodynamically stable.  Delay start of Pharmacological VTE agent (>24hrs) due to surgical blood loss or risk of bleeding: no

## 2014-01-21 NOTE — Transfer of Care (Signed)
Immediate Anesthesia Transfer of Care Note  Patient: Michelle Hooper  Procedure(s) Performed: Procedure(s): IRRIGATION AND DEBRIDEMENT Sacral Decubitus Ulcer (N/A)  Patient Location: ICU  Anesthesia Type:General  Level of Consciousness: sedated and Patient remains intubated per anesthesia plan  Airway & Oxygen Therapy: Patient remains intubated per anesthesia plan and Patient placed on Ventilator (see vital sign flow sheet for setting)  Post-op Assessment: Report given to PACU RN and Post -op Vital signs reviewed and stable  Post vital signs: Reviewed and stable  Complications: No apparent anesthesia complications

## 2014-01-21 NOTE — Anesthesia Preprocedure Evaluation (Signed)
Anesthesia Evaluation  Patient identified by MRN, date of birth, ID band Patient confused    Reviewed: Allergy & Precautions, H&P , NPO status , Patient's Chart, lab work & pertinent test results  Airway  TM Distance: >3 FB Neck ROM: limited   Comment: Uncooperative with exam.  Pt is edentulous. Dental   Pulmonary          Cardiovascular hypertension,     Neuro/Psych PSYCHIATRIC DISORDERS Alzheimer's dementia.    GI/Hepatic   Endo/Other    Renal/GU ARFRenal disease     Musculoskeletal   Abdominal   Peds  Hematology  (+) anemia ,   Anesthesia Other Findings   Reproductive/Obstetrics                           Anesthesia Physical Anesthesia Plan  ASA: IV and emergent  Anesthesia Plan: General   Post-op Pain Management:    Induction: Intravenous  Airway Management Planned: Oral ETT  Additional Equipment:   Intra-op Plan:   Post-operative Plan: Possible Post-op intubation/ventilation and Extubation in OR  Informed Consent: I have reviewed the patients History and Physical, chart, labs and discussed the procedure including the risks, benefits and alternatives for the proposed anesthesia with the patient or authorized representative who has indicated his/her understanding and acceptance.     Plan Discussed with: CRNA, Anesthesiologist and Surgeon  Anesthesia Plan Comments: (Pt has advanced dementia and is a DNR.  Plan of care was discussed with the patient's daughter. )        Anesthesia Quick Evaluation

## 2014-01-21 NOTE — Consult Note (Signed)
Reason for Consult:decubitus ulcer Referring Physician: Dr. Dorice Lamas Michelle Hooper is an 80 y.o. female.  HPI: the patient is an 80 year old female who arrived today secondary to a sacral decub.  The patient also has a long history of Alzheimer's,andhypertension. Secondary to the patient's has Alzheimer's and the history is obtained from the patient's daughter who was present. The patient our states that this sacral decubitus been there for "4 days " she states that she began scratching at an infected. She also states she has been urinating into the wound.  The patient underwent CT scan as well as laboratory studies. Laboratory studies reveal an elevated WBC count. CT scan is questionable necrotizing fasciitis secondary to air and subcutaneous tissue.  Past Medical History  Diagnosis Date  . SBO (small bowel obstruction)   . Hypertension   . Alzheimer's dementia   . Iron deficiency anemia   . Frequent UTI     Past Surgical History  Procedure Laterality Date  . Exploratory laparotomy, lysis of adhesions, small bowel  07/30/10    Family History  Problem Relation Age of Onset  . Cancer Son     Colon cancer  . Diabetes Son   . Diabetes Sister   . Hypertension Sister   . Stroke Sister     Social History:  reports that she has never smoked. She has never used smokeless tobacco. She reports that she does not drink alcohol. Her drug history is not on file.  Allergies: No Known Allergies  Medications: I have reviewed the patient's current medications.  Results for orders placed during the hospital encounter of 01/21/14 (from the past 48 hour(s))  CBG MONITORING, ED     Status: Abnormal   Collection Time    01/21/14  3:22 PM      Result Value Ref Range   Glucose-Capillary 138 (*) 70 - 99 mg/dL  CBC WITH DIFFERENTIAL     Status: Abnormal   Collection Time    01/21/14  4:45 PM      Result Value Ref Range   WBC 18.7 (*) 4.0 - 10.5 K/uL   RBC 4.20  3.87 - 5.11 MIL/uL   Hemoglobin  11.1 (*) 12.0 - 15.0 g/dL   HCT 34.5 (*) 36.0 - 46.0 %   MCV 82.1  78.0 - 100.0 fL   MCH 26.4  26.0 - 34.0 pg   MCHC 32.2  30.0 - 36.0 g/dL   RDW 13.9  11.5 - 15.5 %   Platelets 295  150 - 400 K/uL   Neutrophils Relative % 84 (*) 43 - 77 %   Neutro Abs 15.7 (*) 1.7 - 7.7 K/uL   Lymphocytes Relative 9 (*) 12 - 46 %   Lymphs Abs 1.6  0.7 - 4.0 K/uL   Monocytes Relative 7  3 - 12 %   Monocytes Absolute 1.4 (*) 0.1 - 1.0 K/uL   Eosinophils Relative 0  0 - 5 %   Eosinophils Absolute 0.0  0.0 - 0.7 K/uL   Basophils Relative 0  0 - 1 %   Basophils Absolute 0.0  0.0 - 0.1 K/uL  COMPREHENSIVE METABOLIC PANEL     Status: Abnormal   Collection Time    01/21/14  4:45 PM      Result Value Ref Range   Sodium 157 (*) 137 - 147 mEq/L   Potassium 4.3  3.7 - 5.3 mEq/L   Chloride 117 (*) 96 - 112 mEq/L   CO2 25  19 - 32 mEq/L  Glucose, Bld 170 (*) 70 - 99 mg/dL   BUN 45 (*) 6 - 23 mg/dL   Creatinine, Ser 1.64 (*) 0.50 - 1.10 mg/dL   Calcium 9.0  8.4 - 10.5 mg/dL   Total Protein 6.7  6.0 - 8.3 g/dL   Albumin 2.2 (*) 3.5 - 5.2 g/dL   AST 101 (*) 0 - 37 U/L   ALT 130 (*) 0 - 35 U/L   Alkaline Phosphatase 129 (*) 39 - 117 U/L   Total Bilirubin 0.5  0.3 - 1.2 mg/dL   GFR calc non Af Amer 27 (*) >90 mL/min   GFR calc Af Amer 31 (*) >90 mL/min   Comment: (NOTE)     The eGFR has been calculated using the CKD EPI equation.     This calculation has not been validated in all clinical situations.     eGFR's persistently <90 mL/min signify possible Chronic Kidney     Disease.   Anion gap 15  5 - 15  PROTIME-INR     Status: None   Collection Time    01/21/14  4:45 PM      Result Value Ref Range   Prothrombin Time 15.0  11.6 - 15.2 seconds   INR 1.18  0.00 - 1.49  PROCALCITONIN     Status: None   Collection Time    01/21/14  4:45 PM      Result Value Ref Range   Procalcitonin 1.12     Comment:            Interpretation:     PCT > 0.5 ng/mL and <= 2 ng/mL:     Systemic infection (sepsis) is  possible,     but other conditions are known to elevate     PCT as well.     (NOTE)             ICU PCT Algorithm               Non ICU PCT Algorithm        ----------------------------     ------------------------------             PCT < 0.25 ng/mL                 PCT < 0.1 ng/mL         Stopping of antibiotics            Stopping of antibiotics           strongly encouraged.               strongly encouraged.        ----------------------------     ------------------------------           PCT level decrease by               PCT < 0.25 ng/mL           >= 80% from peak PCT           OR PCT 0.25 - 0.5 ng/mL          Stopping of antibiotics                                                 encouraged.         Stopping of antibiotics  encouraged.        ----------------------------     ------------------------------           PCT level decrease by              PCT >= 0.25 ng/mL           < 80% from peak PCT            AND PCT >= 0.5 ng/mL            Continuing antibiotics                                                  encouraged.           Continuing antibiotics                encouraged.        ----------------------------     ------------------------------         PCT level increase compared          PCT > 0.5 ng/mL             with peak PCT AND              PCT >= 0.5 ng/mL             Escalation of antibiotics                                              strongly encouraged.          Escalation of antibiotics            strongly encouraged.  TROPONIN I     Status: None   Collection Time    01/21/14  4:45 PM      Result Value Ref Range   Troponin I <0.30  <0.30 ng/mL   Comment:            Due to the release kinetics of cTnI,     a negative result within the first hours     of the onset of symptoms does not rule out     myocardial infarction with certainty.     If myocardial infarction is still suspected,     repeat the test at appropriate intervals.    SEDIMENTATION RATE     Status: Abnormal   Collection Time    01/21/14  4:45 PM      Result Value Ref Range   Sed Rate 118 (*) 0 - 22 mm/hr  I-STAT CG4 LACTIC ACID, ED     Status: Abnormal   Collection Time    01/21/14  4:53 PM      Result Value Ref Range   Lactic Acid, Venous 2.61 (*) 0.5 - 2.2 mmol/L  URINALYSIS, ROUTINE W REFLEX MICROSCOPIC     Status: Abnormal   Collection Time    01/21/14  5:14 PM      Result Value Ref Range   Color, Urine YELLOW  YELLOW   APPearance CLEAR  CLEAR   Specific Gravity, Urine 1.018  1.005 - 1.030   pH 7.0  5.0 - 8.0   Glucose, UA NEGATIVE  NEGATIVE mg/dL   Hgb urine dipstick NEGATIVE  NEGATIVE  Bilirubin Urine NEGATIVE  NEGATIVE   Ketones, ur NEGATIVE  NEGATIVE mg/dL   Protein, ur 30 (*) NEGATIVE mg/dL   Urobilinogen, UA 0.2  0.0 - 1.0 mg/dL   Nitrite NEGATIVE  NEGATIVE   Leukocytes, UA TRACE (*) NEGATIVE  URINE MICROSCOPIC-ADD ON     Status: Abnormal   Collection Time    01/21/14  5:14 PM      Result Value Ref Range   Squamous Epithelial / LPF FEW (*) RARE   WBC, UA 3-6  <3 WBC/hpf   RBC / HPF 0-2  <3 RBC/hpf   Bacteria, UA FEW (*) RARE    Ct Abdomen Pelvis Wo Contrast  01/21/2014   CLINICAL DATA:  Abdominal pain, fever, sacral ulcers.  EXAM: CT ABDOMEN AND PELVIS WITHOUT CONTRAST  TECHNIQUE: Multidetector CT imaging of the abdomen and pelvis was performed following the standard protocol without IV contrast.  COMPARISON:  Abdominal radiograph January 21, 2014 and CT of the pelvis September 17, 2013  FINDINGS: Mild motion degraded examination.  LUNG BASES: Included view of the lung bases demonstrates left lung base pleural thickening atelectasis with granuloma. The heart appears mildly enlarged, with annular calcifications, included pericardium is nonsuspicious.  KIDNEYS/BLADDER: Kidneys are orthotopic, demonstrating normal size and morphology. No nephrolithiasis, hydronephrosis; limited assessment for renal masses on this nonenhanced examination. The  unopacified ureters are normal in course and caliber. Urinary bladder is partially distended, containing a Foley bulb and air.  SOLID ORGANS: The liver, spleen, pancreas and adrenal glands are unremarkable for this non-contrast examination. Status post cholecystectomy  GASTROINTESTINAL TRACT: The stomach, small and large bowel are normal in course and caliber without inflammatory changes, the sensitivity may be decreased by lack of enteric contrast. Moderate amount of retained large bowel stool to the level of the rectal vault, no bowel obstruction.  PERITONEUM/RETROPERITONEUM: No intraperitoneal free fluid nor free air. Ectatic infrarenal aorta to 2 cm, with severe calcific atherosclerosis. No lymphadenopathy by CT size criteria. Internal reproductive organs are unremarkable.  SOFT TISSUES/ OSSEOUS STRUCTURES: Inflammatory changes of the left gluteal sulcus with subcutaneous gas along the sacral soft tissues extending into the left gluteus musculature. Probable midline sacral ulcer, in addition to inflammatory changes of the skin and subcutaneous fat without discrete fluid collection.  Suture material along anterior abdominal wall. Patient is osteopenic. Severe degenerative change of the sacroiliac joints.  IMPRESSION: Inflammatory change of the gluteal soft tissues with suspected midline sacral ulcer, superimposed subcutaneous emphysema extending into the left gluteal musculature ; though this could reflect deep extent of ulceration, superimposed necrotizing fasciitis could have a similar appearance. No intraperitoneal extent. No discrete abscess by noncontrast CT.  Moderate amount of retained large bowel stool without obstruction.  Findings discussed with and reconfirmed by Dr.STEPHEN RANCOUR on7/7/2015at6:59 PM.   Electronically Signed   By: Elon Alas   On: 01/21/2014 19:00   Dg Chest 1 View  01/21/2014   CLINICAL DATA:  Altered mental status.  EXAM: CHEST - 1 VIEW  COMPARISON:  09/17/2013  FINDINGS:  Stable elevation of the left hemidiaphragm. Cardiomegaly. Chronic densities at the left lung base, likely scarring. Right lung is clear. No effusions. No acute bony abnormality.  Stable impression on the right side of the trachea, likely tortuous vasculature or right thyroid goiter.  IMPRESSION: Left basilar scarring. Stable chronic elevation of the left hemidiaphragm. Mild cardiomegaly. No acute findings.   Electronically Signed   By: Rolm Baptise M.D.   On: 01/21/2014 16:25   Dg Abd 1  View  01/21/2014   CLINICAL DATA:  Altered mental status.  EXAM: ABDOMEN - 1 VIEW  COMPARISON:  CT 09/17/2013  FINDINGS: Nonobstructive bowel gas pattern. Moderate stool throughout the colon. Mild gaseous distention of the colon. No organomegaly or suspicious calcification. Dense vascular calcifications throughout the aorta and iliac vessels. No visible aneurysm.  Degenerative changes in the hips bilaterally.  Diffuse osteopenia.  IMPRESSION: No evidence of bowel obstruction or free air. Large stool burden with mild gaseous distention of the colon.   Electronically Signed   By: Rolm Baptise M.D.   On: 01/21/2014 16:26   Ct Head Wo Contrast  01/21/2014   CLINICAL DATA:  Altered mental status  EXAM: CT HEAD WITHOUT CONTRAST  TECHNIQUE: Contiguous axial images were obtained from the base of the skull through the vertex without intravenous contrast.  COMPARISON:  September 17, 2013  FINDINGS: There is moderately severe diffuse atrophy, stable. There is no appreciable mass, hemorrhage, extra-axial fluid collection, or midline shift. There is mild small vessel disease in the centra semiovale bilaterally. No acute appearing infarct identified.  Bony calvarium appears intact.  The mastoid air cells are clear.  IMPRESSION: Diffuse atrophy with mild periventricular small vessel disease, stable. No intracranial mass, hemorrhage, or acute appearing infarct.   Electronically Signed   By: Lowella Grip M.D.   On: 01/21/2014 16:26    Review  of Systems  Unable to perform ROS: acuity of condition   Blood pressure 110/59, pulse 91, temperature 98.5 F (36.9 C), temperature source Oral, resp. rate 30, height 4' 11.06" (1.5 m), weight 123 lb 14.4 oz (56.2 kg), SpO2 98.00%. Physical Exam  Vitals reviewed. Constitutional: She is oriented to person, place, and time. She appears well-developed and well-nourished. She is cooperative. No distress. Cervical collar and nasal cannula in place.  HENT:  Head: Normocephalic and atraumatic. Head is without raccoon's eyes, without Battle's sign, without abrasion, without contusion and without laceration.  Right Ear: Hearing, tympanic membrane, external ear and ear canal normal. No lacerations. No drainage or tenderness. No foreign bodies. Tympanic membrane is not perforated. No hemotympanum.  Left Ear: Hearing, tympanic membrane, external ear and ear canal normal. No lacerations. No drainage or tenderness. No foreign bodies. Tympanic membrane is not perforated. No hemotympanum.  Nose: Nose normal. No nose lacerations, sinus tenderness, nasal deformity or nasal septal hematoma. No epistaxis.  Mouth/Throat: Uvula is midline, oropharynx is clear and moist and mucous membranes are normal. No lacerations.  Eyes: Conjunctivae, EOM and lids are normal. Pupils are equal, round, and reactive to light. No scleral icterus.  Neck: Trachea normal. No JVD present. No spinous process tenderness and no muscular tenderness present. Carotid bruit is not present. No thyromegaly present.  Cardiovascular: Normal rate, regular rhythm, normal heart sounds, intact distal pulses and normal pulses.   Respiratory: Breath sounds normal. Tachypnea noted. No respiratory distress. She exhibits no tenderness, no bony tenderness, no laceration and no crepitus.  GI: Normal appearance. She exhibits no distension. Bowel sounds are decreased. There is no tenderness. There is no rigidity, no rebound, no guarding and no CVA tenderness.    Musculoskeletal: Normal range of motion. She exhibits no edema and no tenderness.  Lymphadenopathy:    She has no cervical adenopathy.  Neurological: She is alert and oriented to person, place, and time. She has normal strength. No cranial nerve deficit or sensory deficit. GCS eye subscore is 4. GCS verbal subscore is 5. GCS motor subscore is 6.  Skin: Skin is warm,  dry and intact. She is not diaphoretic.     Large sacral decubitus ulcer with necrotic tissue  Psychiatric: She has a normal mood and affect. Her speech is normal and behavior is normal.    Assessment/Plan: A 80 year old female with a large necrotic decubitus ulcer. The patient also has a approximately stage II ulcer on the right hip area.  1. I believe to the secondary to the patient's infection and necrotic tissue at her sacral decub the patient mandates surgical debridement at this time. This is likely what is driving her infection. 2. The patient be placed on broad-spectrum antibiotics which I agree with at this time. 3. I discussed with the daughter that this could potentially cause chronic wound healing issues, as well as repeated trips to the operating room for further debridement, and/or further surgery, bleeding, further infection, and the patient's daughter voiced understanding and wishes to proceed to the operative. 4. According to the patient's daughter she is a DO NOT RESUSCITATE.  Rosario Jacks., Torii Royse 01/21/2014, 8:06 PM

## 2014-01-21 NOTE — H&P (Addendum)
Hospitalist Admission History and Physical  Patient name: Michelle Hooper Medical record number: [id number] Date of birth: Jan 22, 1925 Age: 80 y.o. Gender: female  Primary Care Provider: Benito Mccreedy, MD  Chief Complaint: sepsis, sacral decubitus ulcer, dehydration  History of Present Illness:This is a 80 y.o. year old female with noted history of end stage dementia, HTN  presenting with sepsis, sacral decubitus ulcer. Per the caregiver, patient has had decreased appetite as well as mild agitation over the past 2 days. Patient is also felt warm to touch. Patient refused to eat today with overall decline and functionality. EMS was called. Patient with noted temperature 130. Brought to the ER for further evaluation. On presentation, temperature 103.9, heart rate in the 90s to 120s, respirations and attends the 40s, blood pressure in the 361W to 431V systolic. Satting greater than 97% on room air. White blood cell count 18.7, hemoglobin 11.1, sodium 137, creatinine 1.64, BUN of 45. Lactate 2.6. Chest x-ray negative for pneumonia. There was a concerning area over the sacral decubiti with ulceration in the stage IV with bone exposure and purulent foul-smelling drainage. Head CT negative for any acute findings. A followup CT of the abdomen and pelvis shows area around the sacral decubiti ulcer concern for possible necrotizing fasciitis. Patient started on vancomycin and Zosyn for sepsis coverage. Surgery consult pending.  Assessment and Plan: Avion Patella is a 80 y.o. year old female presenting with sepsis, sacral decubitus ulcer   Sepsis: Likely secondary to infected sacral decubiti ulcer. Surgery consult pending given? Necrotizing fasciitis. Continue vancomycin and Zosyn. Pan culture. Step down bed. Follow up on surgery recs. Trend leukocytosis and lactate. F/u on ESR as to r/o osteo.   HTN: Hold oral meds given sepsis picture. Prn hydralazine. Continue to follow.   FEN/GI: NPO for now.  Noted hypernatremia and AKI on presentation. Markedly dehydrated on exam. Suspect hypovolemic/pre renal etiology for sxs. Hydrate pt and reasess. Continue to follow.   Prophylaxis: sub q heparin  Disposition: pending further evaluation  Code Status:DNR    Patient Active Problem List   Diagnosis Date Noted  . Sepsis 01/21/2014  . Malnutrition of moderate degree 09/18/2013  . Hypertensive urgency 09/17/2013  . UTI (lower urinary tract infection) 09/17/2013  . Left leg pain 09/17/2013  . Severe protein-calorie malnutrition 09/17/2013  . Immobility 09/17/2013  . Acute kidney injury 09/17/2013  . Accelerated hypertension 09/17/2013  . Alzheimer's dementia    Past Medical History: Past Medical History  Diagnosis Date  . SBO (small bowel obstruction)   . Hypertension   . Alzheimer's dementia   . Iron deficiency anemia   . Frequent UTI     Past Surgical History: Past Surgical History  Procedure Laterality Date  . Exploratory laparotomy, lysis of adhesions, small bowel  07/30/10    Social History: History   Social History  . Marital Status: Widowed    Spouse Name: N/A    Number of Children: 7  . Years of Education: N/A   Occupational History  . Retired [employer] officer, [employer]    Social History Main Topics  . Smoking status: Never Smoker   . Smokeless tobacco: Never Used  . Alcohol Use: No  . Drug Use: None  . Sexual Activity: No   Other Topics Concern  . None   Social History Narrative   Widowed.  Lives with daughter.  Non-ambulatory x several months.    Family History: Family History  Problem Relation Age of Onset  .  Cancer Son     Colon cancer  . Diabetes Son   . Diabetes Sister   . Hypertension Sister   . Stroke Sister     Allergies: No Known Allergies  Current Facility-Administered Medications  Medication Dose Route Frequency Provider Last Rate Last Dose  . 0.9 %  sodium chloride infusion  1,000 mL Intravenous  Continuous Ezequiel Essex, MD      . 0.9 %  sodium chloride infusion   Intravenous STAT Ezequiel Essex, MD 75 mL/hr at 01/21/14 1901    . 0.9 %  sodium chloride infusion   Intravenous Continuous Shanda Howells, MD      . heparin injection 5,000 Units  5,000 Units Subcutaneous 3 times per day Shanda Howells, MD      . hydrALAZINE (APRESOLINE) injection 5 mg  5 mg Intravenous Q4H PRN Shanda Howells, MD      . morphine 2 MG/ML injection 1-2 mg  1-2 mg Intravenous Q2H PRN Shanda Howells, MD      . piperacillin-tazobactam (ZOSYN) IVPB 2.25 g  2.25 g Intravenous Q6H Rande Lawman Rumbarger, RPH      . sodium chloride 0.9 % injection 3 mL  3 mL Intravenous Q12H Shanda Howells, MD      . Derrill Memo ON 01/23/2014] vancomycin (VANCOCIN) IVPB 1000 mg/200 mL premix  1,000 mg Intravenous Q48H Rande Lawman Rumbarger, [facility]       Current Outpatient Prescriptions  Medication Sig Dispense Refill  . Acetaminophen (TYLENOL CHILDRENS PO) Take 15 mLs by mouth daily as needed (pain).      Marland Kitchen amLODipine (NORVASC) 10 MG tablet Take 1 tablet (10 mg total) by mouth daily.  30 tablet  3  . cloNIDine (CATAPRES - DOSED IN MG/24 HR) 0.2 mg/24hr patch Place 1 patch (0.2 mg total) onto the skin once a week.  4 patch  12  . hydrALAZINE (APRESOLINE) 50 MG tablet Take 1 tablet (50 mg total) by mouth every 8 (eight) hours.  90 tablet  7  . Multiple Vitamin (MULTI VITAMIN DAILY PO) Take 15 mLs by mouth daily.       Review Of Systems: 12 point ROS negative except as noted above in HPI.  Physical Exam: Filed Vitals:   01/21/14 1915  BP: 115/68  Pulse: 94  Temp:   Resp: 30    General: minimally responsive, non verbal  HEENT: PERRLA, extra ocular movement intact and poor dentition, dry oral mucosa Heart: S1, S2 normal, no murmur, rub or gallop, regular rate and rhythm Lungs: clear to auscultation, no wheezes or rales and unlabored breathing Abdomen: + bowel sounds, + generalized abd TTP, stage 4 sacral decubuti ulcer over sacral area with  bony exposure and purulent-foul smelling drainage  Extremities: extremities normal, atraumatic, no cyanosis or edema Skin:no rashes, no ecchymoses Neurology: minimally cooperative to exam, no focal deficits noted.   Labs and Imaging: Lab Results  Component Value Date/Time   NA 157* 01/21/2014  4:45 PM   K 4.3 01/21/2014  4:45 PM   CL 117* 01/21/2014  4:45 PM   CO2 25 01/21/2014  4:45 PM   BUN 45* 01/21/2014  4:45 PM   CREATININE 1.64* 01/21/2014  4:45 PM   GLUCOSE 170* 01/21/2014  4:45 PM   Lab Results  Component Value Date   WBC 18.7* 01/21/2014   HGB 11.1* 01/21/2014   HCT 34.5* 01/21/2014   MCV 82.1 01/21/2014   PLT 295 01/21/2014   Urinalysis    Component Value Date/Time   COLORURINE YELLOW  01/21/2014 [place] 01/21/2014 1714   LABSPEC 1.018 01/21/2014 1714   PHURINE 7.0 01/21/2014 1714   GLUCOSEU NEGATIVE 01/21/2014 1714   HGBUR NEGATIVE 01/21/2014 1714   BILIRUBINUR NEGATIVE 01/21/2014 1714   KETONESUR NEGATIVE 01/21/2014 1714   PROTEINUR 30* 01/21/2014 1714   UROBILINOGEN 0.2 01/21/2014 1714   NITRITE NEGATIVE 01/21/2014 1714   LEUKOCYTESUR TRACE* 01/21/2014 1714       Ct Abdomen Pelvis Wo Contrast  01/21/2014   CLINICAL DATA:  Abdominal pain, fever, sacral ulcers.  EXAM: CT ABDOMEN AND PELVIS WITHOUT CONTRAST  TECHNIQUE: Multidetector CT imaging of the abdomen and pelvis was performed following the standard protocol without IV contrast.  COMPARISON:  Abdominal radiograph January 21, 2014 and CT of the pelvis September 17, 2013  FINDINGS: Mild motion degraded examination.  LUNG BASES: Included view of the lung bases demonstrates left lung base pleural thickening atelectasis with granuloma. The heart appears mildly enlarged, with annular calcifications, included pericardium is nonsuspicious.  KIDNEYS/BLADDER: Kidneys are orthotopic, demonstrating normal size and morphology. No nephrolithiasis, hydronephrosis; limited assessment for renal masses on this nonenhanced examination. The unopacified ureters are  normal in course and caliber. Urinary bladder is partially distended, containing a Foley bulb and air.  SOLID ORGANS: The liver, spleen, pancreas and adrenal glands are unremarkable for this non-contrast examination. Status post cholecystectomy  GASTROINTESTINAL TRACT: The stomach, small and large bowel are normal in course and caliber without inflammatory changes, the sensitivity may be decreased by lack of enteric contrast. Moderate amount of retained large bowel stool to the level of the rectal vault, no bowel obstruction.  PERITONEUM/RETROPERITONEUM: No intraperitoneal free fluid nor free air. Ectatic infrarenal aorta to 2 cm, with severe calcific atherosclerosis. No lymphadenopathy by CT size criteria. Internal reproductive organs are unremarkable.  SOFT TISSUES/ OSSEOUS STRUCTURES: Inflammatory changes of the left gluteal sulcus with subcutaneous gas along the sacral soft tissues extending into the left gluteus musculature. Probable midline sacral ulcer, in addition to inflammatory changes of the skin and subcutaneous fat without discrete fluid collection.  Suture material along anterior abdominal wall. Patient is osteopenic. Severe degenerative change of the sacroiliac joints.  IMPRESSION: Inflammatory change of the gluteal soft tissues with suspected midline sacral ulcer, superimposed subcutaneous emphysema extending into the left gluteal musculature ; though this could reflect deep extent of ulceration, superimposed necrotizing fasciitis could have a similar appearance. No intraperitoneal extent. No discrete abscess by noncontrast CT.  Moderate amount of retained large bowel stool without obstruction.  Findings discussed with and reconfirmed by Dr.STEPHEN RANCOUR on7/7/2015at6:59 PM.   Electronically Signed   By: Elon Alas   On: 01/21/2014 19:00   Dg Chest 1 View  01/21/2014   CLINICAL DATA:  Altered mental status.  EXAM: CHEST - 1 VIEW  COMPARISON:  09/17/2013  FINDINGS: Stable elevation of the  left hemidiaphragm. Cardiomegaly. Chronic densities at the left lung base, likely scarring. Right lung is clear. No effusions. No acute bony abnormality.  Stable impression on the right side of the trachea, likely tortuous vasculature or right thyroid goiter.  IMPRESSION: Left basilar scarring. Stable chronic elevation of the left hemidiaphragm. Mild cardiomegaly. No acute findings.   Electronically Signed   By: Rolm Baptise M.D.   On: 01/21/2014 16:25   Dg Abd 1 View  01/21/2014   CLINICAL DATA:  Altered mental status.  EXAM: ABDOMEN - 1 VIEW  COMPARISON:  CT 09/17/2013  FINDINGS: Nonobstructive bowel gas pattern. Moderate stool throughout the colon. Mild gaseous distention  of the colon. No organomegaly or suspicious calcification. Dense vascular calcifications throughout the aorta and iliac vessels. No visible aneurysm.  Degenerative changes in the hips bilaterally.  Diffuse osteopenia.  IMPRESSION: No evidence of bowel obstruction or free air. Large stool burden with mild gaseous distention of the colon.   Electronically Signed   By: Rolm Baptise M.D.   On: 01/21/2014 16:26   Ct Head Wo Contrast  01/21/2014   CLINICAL DATA:  Altered mental status  EXAM: CT HEAD WITHOUT CONTRAST  TECHNIQUE: Contiguous axial images were obtained from the base of the skull through the vertex without intravenous contrast.  COMPARISON:  September 17, 2013  FINDINGS: There is moderately severe diffuse atrophy, stable. There is no appreciable mass, hemorrhage, extra-axial fluid collection, or midline shift. There is mild small vessel disease in the centra semiovale bilaterally. No acute appearing infarct identified.  Bony calvarium appears intact.  The mastoid air cells are clear.  IMPRESSION: Diffuse atrophy with mild periventricular small vessel disease, stable. No intracranial mass, hemorrhage, or acute appearing infarct.   Electronically Signed   By: Lowella Grip M.D.   On: 01/21/2014 16:26           Shanda Howells MD   Pager: [phone]

## 2014-01-21 NOTE — Progress Notes (Signed)
ANTIBIOTIC CONSULT NOTE - INITIAL  Pharmacy Consult for vancomycin + zosyn Indication: rule out sepsis  No Known Allergies  Patient Measurements: Height: 4' 11.06" (150 cm) (from previous admission) Weight: 123 lb 14.4 oz (56.2 kg) (from previous admission) IBW/kg (Calculated) : 43.33  Vital Signs: Temp: 103.9 F (39.9 C) (07/07 1500) Temp src: Rectal (07/07 1500) BP: 101/59 mmHg (07/07 1715) Pulse Rate: 112 (07/07 1715) Intake/Output from previous day:   Intake/Output from this shift: Total I/O In: -  Out: 50 [Urine:50]  Labs:  Recent Labs  01/21/14 1645  WBC 18.7*  HGB 11.1*  PLT 295  CREATININE 1.64*   Estimated Creatinine Clearance: 18.5 ml/min (by C-G formula based on Cr of 1.64). No results found for this basename: VANCOTROUGH, VANCOPEAK, VANCORANDOM, GENTTROUGH, GENTPEAK, GENTRANDOM, TOBRATROUGH, TOBRAPEAK, TOBRARND, AMIKACINPEAK, AMIKACINTROU, AMIKACIN,  in the last 72 hours   Microbiology: No results found for this or any previous visit (from the past 720 hour(s)).  Medical History: Past Medical History  Diagnosis Date  . SBO (small bowel obstruction)   . Hypertension   . Alzheimer's dementia   . Iron deficiency anemia   . Frequent UTI     Medications:  Anti-infectives   Start     Dose/Rate Route Frequency Ordered Stop   01/23/14 1600  vancomycin (VANCOCIN) IVPB 1000 mg/200 mL premix     1,000 mg 200 mL/hr over 60 Minutes Intravenous Every 48 hours 01/21/14 1738     01/21/14 2130  piperacillin-tazobactam (ZOSYN) IVPB 2.25 g     2.25 g 100 mL/hr over 30 Minutes Intravenous Every 6 hours 01/21/14 1738     01/21/14 1500  piperacillin-tazobactam (ZOSYN) IVPB 3.375 g     3.375 g 100 mL/hr over 30 Minutes Intravenous  Once 01/21/14 1459 01/21/14 1543   01/21/14 1500  vancomycin (VANCOCIN) IVPB 1000 mg/200 mL premix     1,000 mg 200 mL/hr over 60 Minutes Intravenous  Once 01/21/14 1459 01/21/14 1650     Assessment: 89 yof presented to the ED with  AMS. To start empiric vancomycin + zosyn for possible sepsis. Possible source is her decubitus ulcers per MD assessment. Tmax is 103.9 and WBC is elevated at 18.7. Lactic acid is also elevated at 2.61. Scr is above baseline at 1.64. First doses of antibiotics ordered by EDP.   Vanc 7/7>> Zosyn 7/7>>  Goal of Therapy:  Vancomycin trough level 15-20 mcg/ml (consider reducing goal once sepsis is resolved)  Plan:  1. Vancomycin 1gm IV Q48H 2. Zosyn 3.375gm IV x 1 then 2.25mg  IV Q6H 3. F/u renal fxn, C&S, clinical status and [facility]  Equan Cogbill, Drake Leachachel Lynn 01/21/2014,5:40 PM

## 2014-01-21 NOTE — ED Notes (Signed)
PT coming from home. Hx of alzheimers- unable to talk which is her baseline. PT has been yelling out in pain for the past few days when touched. PT has daughter as caregiver. PT is DNR w/ yellow form at bedside. Hx of UTI and PT immobile and contracted. 22g L FA placed by EMS. Lungs CTA bilat 96% RA, 114/80, HR ~130 per EMS. PT has bed sore with dressing on buttocks and R flank /buttock area has bed sore as well. Hx of kidney injury - abdominal scar. CBG 151 per EMS

## 2014-01-21 NOTE — Brief Op Note (Signed)
01/21/2014  9:28 PM  PATIENT:  Michelle KurtzGeneva Hooper  80 y.o. female  PRE-OPERATIVE DIAGNOSIS:  Sacral Abscess  POST-OPERATIVE DIAGNOSIS:  Sacral  Decubitus ulcer  PROCEDURE:  Procedure(s): IRRIGATION AND DEBRIDEMENT Sacral Decubitus Ulcer (N/A)  SURGEON:  Surgeon(s) and Role:    * Axel FillerArmando Proctor Carriker, MD - Primary  PHYSICIAN ASSISTANT:   ASSISTANTS: none   ANESTHESIA:   general  EBL:  Total I/O In: -  Out: 80 [Urine:50; Blood:30]  BLOOD ADMINISTERED:none  DRAINS: none   LOCAL MEDICATIONS USED:  NONE  SPECIMEN:  No Specimen  DISPOSITION OF SPECIMEN:  N/A  COUNTS:  YES  TOURNIQUET:  * No tourniquets in log *  DICTATION: .Dragon Dictation After the patient was consented she was taken back to the operative placed in the lateral decubitus position. Patient was prepped and draped in usual sterile fashion. A timeout was called and all facts were verified.    A circumferential debridement of the sacral wound was undertaken sharply with a 10 blade. All necrotic skin including subcutaneous tissue was debrided. It should be noted that the sacrum was exposed prior to the beginning of debridement. A curette was used to help debride some of the subcutaneous tissue. At this time pulse irrigation was used to clean out the wound. The final measurements of the wound measured approximately 8 x 9 cm in size.  Hemostasis was achieved using Bovie cautery. The wound was packed with Kerlix soaked in quarter percent Dakin solution.  It was dressed with 4 x 4's ABD pads and tape.  The patient tolerated the procedure well was taken to recovery stable condition.  2.  Tool used for debridement (curette, scapel, etc.): scalpel, curette, electrocautery  3.  Frequency of surgical debridement.   First debridement  4.  Measurement of total devitalized tissue (wound surface) before and after surgical debridement.   Before-approximately6 x 8 cm; after-approximately 8 x 9 cm  5.  Area and depth of devitalized  tissue removed from wound.  Approximately 3 cm in maximum depth  6.  Blood loss and description of tissue removed.  EBL-10 cc. Necrotic removal of dermis and subcutaneous tissue  7.  Evidence of the progress of the wound's response to treatment.  A.  Current wound volume (current dimensions and depth).  8 x 9 x 3 cm  B.  Presence (and extent of) of infection.  Surrounding subcutaneous chronic infection  C.  Presence (and extent of) of non viable tissue.  Original 6 x 8 cm of necrotic dermal and subcutaneous tissue  D.  Other material in the wound that is expected to inhibit healing.  infection  8.  Was there any viable tissue removed (measurements): no   PLAN OF CARE: Admit to inpatient   PATIENT DISPOSITION:  PACU - hemodynamically stable.   Delay start of Pharmacological VTE agent (>24hrs) due to surgical blood loss or risk of bleeding: no

## 2014-01-21 NOTE — ED Notes (Signed)
Notified 3S that patient is going to the OR.

## 2014-01-21 NOTE — ED Provider Notes (Signed)
CSN: [id number]     Arrival date & time 01/21/14  1442 History   First MD Initiated Contact with Patient 01/21/14 1451     No chief complaint on file.    (Consider location/radiation/quality/duration/timing/severity/associated sxs/prior Treatment) HPI Comments: Level V caveat for dementia and altered mental status. Patient home with change in mental status for the past one day. She's been yelling out in pain and felt warm. Patient is nonverbal at baseline. She is immobile and contracted. She is unable to give any history. EMS reports patient is tachycardic and tachypnea. She has a DO NOT RESUSCITATE form in place. CBG 151.  The history is provided by the patient and the EMS personnel. The history is limited by the condition of the patient.    Past Medical History  Diagnosis Date  . SBO (small bowel obstruction)   . Hypertension   . Alzheimer's dementia   . Iron deficiency anemia   . Frequent UTI    Past Surgical History  Procedure Laterality Date  . Exploratory laparotomy, lysis of adhesions, small bowel  07/30/10   Family History  Problem Relation Age of Onset  . Cancer Son     Colon cancer  . Diabetes Son   . Diabetes Sister   . Hypertension Sister   . Stroke Sister    History  Substance Use Topics  . Smoking status: Never Smoker   . Smokeless tobacco: Never Used  . Alcohol Use: No   OB History   Grav Para Term Preterm Abortions TAB SAB Ect Mult Living                 Review of Systems  Unable to perform ROS: Dementia      Allergies  Review of patient's allergies indicates no known allergies.  Home Medications   Prior to Admission medications   Medication Sig Start Date End Date Taking? Authorizing Provider  Acetaminophen (TYLENOL CHILDRENS PO) Take 15 mLs by mouth daily as needed (pain).   Yes Historical Provider, MD  amLODipine (NORVASC) 10 MG tablet Take 1 tablet (10 mg total) by mouth daily. 09/22/13  Yes Dorothea Ogle, MD  cloNIDine (CATAPRES - DOSED IN  MG/24 HR) 0.2 mg/24hr patch Place 1 patch (0.2 mg total) onto the skin once a week. 09/22/13  Yes Dorothea Ogle, MD  hydrALAZINE (APRESOLINE) 50 MG tablet Take 1 tablet (50 mg total) by mouth every 8 (eight) hours. 09/22/13  Yes Dorothea Ogle, MD  Multiple Vitamin (MULTI VITAMIN DAILY PO) Take 15 mLs by mouth daily.   Yes Historical Provider, MD   BP 104/60  Pulse 80  Temp(Src) 98.6 F (37 C) (Oral)  Resp 23  Ht 4' 11.06" (1.5 m)  Wt 123 lb 14.4 oz (56.2 kg)  BMI 24.98 kg/m2  SpO2 99% Physical Exam  Nursing note and vitals reviewed. Constitutional: She appears well-developed. No distress.  Nonverbal, cachectic, contracted  HENT:  Head: Normocephalic and atraumatic.  Mouth/Throat: Oropharynx is clear and moist. No oropharyngeal exudate.  Dry mucus membranes  Eyes: Conjunctivae and EOM are normal.  Pinpoint pupils bilaterally  Neck: Normal range of motion. Neck supple.  No meningismus.  Cardiovascular: Normal rate, normal heart sounds and intact distal pulses.   No murmur heard. Tachycardic to 125  Pulmonary/Chest: Effort normal and breath sounds normal. No respiratory distress.   Tachypnea to 40  Abdominal: Soft. There is no tenderness. There is no rebound and no guarding.  Musculoskeletal:  Large right ischial decubitus ulcer with  foul smelling drainage  Second decubitus ulcer to right hip which is clean based  Neurological: No cranial nerve deficit. She exhibits normal muscle tone. Coordination normal.  Nonverbal, does not follow commands, contractures of upper extremities and lower extremities  Skin: Skin is warm.  Psychiatric: She has a normal mood and affect. Her behavior is normal.    ED Course  Procedures (including critical care time) Labs Review Labs Reviewed  URINALYSIS, ROUTINE W REFLEX MICROSCOPIC - Abnormal; Notable for the following:    Protein, ur 30 (*)    Leukocytes, UA TRACE (*)    All other components within normal limits  CBC WITH DIFFERENTIAL -  Abnormal; Notable for the following:    WBC 18.7 (*)    Hemoglobin 11.1 (*)    HCT 34.5 (*)    Neutrophils Relative % 84 (*)    Neutro Abs 15.7 (*)    Lymphocytes Relative 9 (*)    Monocytes Absolute 1.4 (*)    All other components within normal limits  COMPREHENSIVE METABOLIC PANEL - Abnormal; Notable for the following:    Sodium 157 (*)    Chloride 117 (*)    Glucose, Bld 170 (*)    BUN 45 (*)    Creatinine, Ser 1.64 (*)    Albumin 2.2 (*)    AST 101 (*)    ALT 130 (*)    Alkaline Phosphatase 129 (*)    GFR calc non Af Amer 27 (*)    GFR calc Af Amer 31 (*)    All other components within normal limits  URINE MICROSCOPIC-ADD ON - Abnormal; Notable for the following:    Squamous Epithelial / LPF FEW (*)    Bacteria, UA FEW (*)    All other components within normal limits  SEDIMENTATION RATE - Abnormal; Notable for the following:    Sed Rate 118 (*)    All other components within normal limits  CBC - Abnormal; Notable for the following:    WBC 15.0 (*)    Hemoglobin 10.1 (*)    HCT 31.7 (*)    All other components within normal limits  CREATININE, SERUM - Abnormal; Notable for the following:    Creatinine, Ser 1.56 (*)    GFR calc non Af Amer 29 (*)    GFR calc Af Amer 33 (*)    All other components within normal limits  GLUCOSE, CAPILLARY - Abnormal; Notable for the following:    Glucose-Capillary 114 (*)    All other components within normal limits  GLUCOSE, CAPILLARY - Abnormal; Notable for the following:    Glucose-Capillary 111 (*)    All other components within normal limits  CBG MONITORING, ED - Abnormal; Notable for the following:    Glucose-Capillary 138 (*)    All other components within normal limits  I-STAT CG4 LACTIC ACID, ED - Abnormal; Notable for the following:    Lactic Acid, Venous 2.61 (*)    All other components within normal limits  POCT I-STAT 3, ART BLOOD GAS (G3+) - Abnormal; Notable for the following:    pH, Arterial 7.319 (*)    pCO2  arterial 45.9 (*)    pO2, Arterial 215.0 (*)    All other components within normal limits  MRSA PCR SCREENING  CULTURE, BLOOD (ROUTINE X 2)  CULTURE, BLOOD (ROUTINE X 2)  URINE CULTURE  WOUND CULTURE  PROTIME-INR  PROCALCITONIN  TROPONIN I  COMPREHENSIVE METABOLIC PANEL  CBC WITH DIFFERENTIAL  OSMOLALITY, URINE  BLOOD GAS, ARTERIAL  LACTIC ACID, PLASMA    Imaging Review Ct Abdomen Pelvis Wo Contrast  01/21/2014   CLINICAL DATA:  Abdominal pain, fever, sacral ulcers.  EXAM: CT ABDOMEN AND PELVIS WITHOUT CONTRAST  TECHNIQUE: Multidetector CT imaging of the abdomen and pelvis was performed following the standard protocol without IV contrast.  COMPARISON:  Abdominal radiograph January 21, 2014 and CT of the pelvis September 17, 2013  FINDINGS: Mild motion degraded examination.  LUNG BASES: Included view of the lung bases demonstrates left lung base pleural thickening atelectasis with granuloma. The heart appears mildly enlarged, with annular calcifications, included pericardium is nonsuspicious.  KIDNEYS/BLADDER: Kidneys are orthotopic, demonstrating normal size and morphology. No nephrolithiasis, hydronephrosis; limited assessment for renal masses on this nonenhanced examination. The unopacified ureters are normal in course and caliber. Urinary bladder is partially distended, containing a Foley bulb and air.  SOLID ORGANS: The liver, spleen, pancreas and adrenal glands are unremarkable for this non-contrast examination. Status post cholecystectomy  GASTROINTESTINAL TRACT: The stomach, small and large bowel are normal in course and caliber without inflammatory changes, the sensitivity may be decreased by lack of enteric contrast. Moderate amount of retained large bowel stool to the level of the rectal vault, no bowel obstruction.  PERITONEUM/RETROPERITONEUM: No intraperitoneal free fluid nor free air. Ectatic infrarenal aorta to 2 cm, with severe calcific atherosclerosis. No lymphadenopathy by CT size  criteria. Internal reproductive organs are unremarkable.  SOFT TISSUES/ OSSEOUS STRUCTURES: Inflammatory changes of the left gluteal sulcus with subcutaneous gas along the sacral soft tissues extending into the left gluteus musculature. Probable midline sacral ulcer, in addition to inflammatory changes of the skin and subcutaneous fat without discrete fluid collection.  Suture material along anterior abdominal wall. Patient is osteopenic. Severe degenerative change of the sacroiliac joints.  IMPRESSION: Inflammatory change of the gluteal soft tissues with suspected midline sacral ulcer, superimposed subcutaneous emphysema extending into the left gluteal musculature ; though this could reflect deep extent of ulceration, superimposed necrotizing fasciitis could have a similar appearance. No intraperitoneal extent. No discrete abscess by noncontrast CT.  Moderate amount of retained large bowel stool without obstruction.  Findings discussed with and reconfirmed by Dr.Shams Fill on7/7/2015at6:59 PM.   Electronically Signed   By: Awilda Metroourtnay  Bloomer   On: 01/21/2014 19:00   Dg Chest 1 View  01/21/2014   CLINICAL DATA:  Altered mental status.  EXAM: CHEST - 1 VIEW  COMPARISON:  09/17/2013  FINDINGS: Stable elevation of the left hemidiaphragm. Cardiomegaly. Chronic densities at the left lung base, likely scarring. Right lung is clear. No effusions. No acute bony abnormality.  Stable impression on the right side of the trachea, likely tortuous vasculature or right thyroid goiter.  IMPRESSION: Left basilar scarring. Stable chronic elevation of the left hemidiaphragm. Mild cardiomegaly. No acute findings.   Electronically Signed   By: Charlett NoseKevin  Dover M.D.   On: 01/21/2014 16:25   Dg Abd 1 View  01/21/2014   CLINICAL DATA:  Altered mental status.  EXAM: ABDOMEN - 1 VIEW  COMPARISON:  CT 09/17/2013  FINDINGS: Nonobstructive bowel gas pattern. Moderate stool throughout the colon. Mild gaseous distention of the colon. No  organomegaly or suspicious calcification. Dense vascular calcifications throughout the aorta and iliac vessels. No visible aneurysm.  Degenerative changes in the hips bilaterally.  Diffuse osteopenia.  IMPRESSION: No evidence of bowel obstruction or free air. Large stool burden with mild gaseous distention of the colon.   Electronically Signed   By: Charlett NoseKevin  Dover M.D.   On: 01/21/2014 16:26  Ct Head Wo Contrast  01/21/2014   CLINICAL DATA:  Altered mental status  EXAM: CT HEAD WITHOUT CONTRAST  TECHNIQUE: Contiguous axial images were obtained from the base of the skull through the vertex without intravenous contrast.  COMPARISON:  September 17, 2013  FINDINGS: There is moderately severe diffuse atrophy, stable. There is no appreciable mass, hemorrhage, extra-axial fluid collection, or midline shift. There is mild small vessel disease in the centra semiovale bilaterally. No acute appearing infarct identified.  Bony calvarium appears intact.  The mastoid air cells are clear.  IMPRESSION: Diffuse atrophy with mild periventricular small vessel disease, stable. No intracranial mass, hemorrhage, or acute appearing infarct.   Electronically Signed   By: Bretta Bang M.D.   On: 01/21/2014 16:26   Dg Chest Port 1 View  01/22/2014   CLINICAL DATA:  Sepsis. Acute respiratory failure. Endotracheal tube and orogastric tube placement.  EXAM: PORTABLE CHEST - 1 VIEW  COMPARISON:  01/21/2014  FINDINGS: Endotracheal tube is now seen with tip 3 cm above the carina. Nasogastric tube is seen with tip in the stomach.  Elevation of left hemidiaphragm is again demonstrated with left basilar scarring. No evidence of pulmonary consolidation or edema. Right lung is clear. Patient is rotated to the left. Heart size is stable.  IMPRESSION: Endotracheal tube and nasogastric tube in appropriate position.  Stable elevated left hemidiaphragm and left basilar scarring.   Electronically Signed   By: Myles Rosenthal M.D.   On: 01/22/2014 00:20      EKG Interpretation   Date/Time:  Tuesday January 21 2014 15:16:50 EDT Ventricular Rate:  121 PR Interval:  112 QRS Duration: 90 QT Interval:  323 QTC Calculation: 458 R Axis:   -51 Text Interpretation:  Sinus tachycardia Abnormal R-wave progression, late  transition LVH with secondary repolarization abnormality Probable RV  involvement, suggest recording right precordial leads Rate faster LVH with  j point elevation Confirmed by Manus Gunning  MD, Damara Klunder [id number]) on 01/21/2014  3:24:59 PM      MDM   Final diagnoses:  Sepsis, due to unspecified organism  Infected decubitus ulcer, unspecified pressure ulcer stage   demented patient from home with altered mental status and fever. She is tachycardic and tachypnea. Code sepsis called on arrival. Likely source is decubitus ulcer  UA negative. Lactate 2.6, WBC 18. CXR clear. Na 157. Cr 1.6  Source of infection is likely infected decubitus ulcer. Patient hypernatremic. Dehydrated. CT shows subcutaneous emphysema and concern for necrotizing fasciitis per radiology.  Broad spectrum antibiotics already started.  Dr. Dr. Derrell Lolling of surgery who will consult.   Admission discussed with Dr. Alvester Morin. BP remains stable in the ED.  Remains tachycardia.  Patient going to OR for wound debridement with Dr. Derrell Lolling.  CRITICAL CARE Performed by: Glynn Octave Total critical care time: 35 Critical care time was exclusive of separately billable procedures and treating other patients. Critical care was necessary to treat or prevent imminent or life-threatening deterioration. Critical care was time spent personally by me on the following activities: development of treatment plan with patient and/or surrogate as well as nursing, discussions with consultants, evaluation of patient's response to treatment, examination of patient, obtaining history from patient or surrogate, ordering and performing treatments and interventions, ordering and review of laboratory  studies, ordering and review of radiographic studies, pulse oximetry and re-evaluation of patient's condition.     Glynn Octave, MD 01/22/14 [phone]

## 2014-01-22 ENCOUNTER — Encounter (HOSPITAL_COMMUNITY): Payer: Self-pay | Admitting: General Surgery

## 2014-01-22 ENCOUNTER — Inpatient Hospital Stay (HOSPITAL_COMMUNITY): Payer: Medicare Other

## 2014-01-22 DIAGNOSIS — I959 Hypotension, unspecified: Secondary | ICD-10-CM | POA: Insufficient documentation

## 2014-01-22 DIAGNOSIS — R6520 Severe sepsis without septic shock: Secondary | ICD-10-CM

## 2014-01-22 DIAGNOSIS — J9600 Acute respiratory failure, unspecified whether with hypoxia or hypercapnia: Secondary | ICD-10-CM

## 2014-01-22 DIAGNOSIS — A419 Sepsis, unspecified organism: Secondary | ICD-10-CM | POA: Diagnosis present

## 2014-01-22 DIAGNOSIS — J9601 Acute respiratory failure with hypoxia: Secondary | ICD-10-CM | POA: Diagnosis present

## 2014-01-22 DIAGNOSIS — L8990 Pressure ulcer of unspecified site, unspecified stage: Secondary | ICD-10-CM

## 2014-01-22 LAB — CBC WITH DIFFERENTIAL/PLATELET
BASOS PCT: 0 % (ref 0–1)
Basophils Absolute: 0 10*3/uL (ref 0.0–0.1)
EOS ABS: 0.1 10*3/uL (ref 0.0–0.7)
Eosinophils Relative: 0 % (ref 0–5)
HEMATOCRIT: 29.1 % — AB (ref 36.0–46.0)
HEMOGLOBIN: 9.3 g/dL — AB (ref 12.0–15.0)
Lymphocytes Relative: 7 % — ABNORMAL LOW (ref 12–46)
Lymphs Abs: 1 10*3/uL (ref 0.7–4.0)
MCH: 26.2 pg (ref 26.0–34.0)
MCHC: 32 g/dL (ref 30.0–36.0)
MCV: 82 fL (ref 78.0–100.0)
MONO ABS: 0.8 10*3/uL (ref 0.1–1.0)
Monocytes Relative: 5 % (ref 3–12)
Neutro Abs: 12.7 10*3/uL — ABNORMAL HIGH (ref 1.7–7.7)
Neutrophils Relative %: 88 % — ABNORMAL HIGH (ref 43–77)
Platelets: 218 10*3/uL (ref 150–400)
RBC: 3.55 MIL/uL — ABNORMAL LOW (ref 3.87–5.11)
RDW: 14 % (ref 11.5–15.5)
WBC: 14.6 10*3/uL — ABNORMAL HIGH (ref 4.0–10.5)

## 2014-01-22 LAB — URINE CULTURE
CULTURE: NO GROWTH
Colony Count: NO GROWTH

## 2014-01-22 LAB — GLUCOSE, CAPILLARY
GLUCOSE-CAPILLARY: 111 mg/dL — AB (ref 70–99)
GLUCOSE-CAPILLARY: 80 mg/dL (ref 70–99)
Glucose-Capillary: 111 mg/dL — ABNORMAL HIGH (ref 70–99)
Glucose-Capillary: 67 mg/dL — ABNORMAL LOW (ref 70–99)
Glucose-Capillary: 68 mg/dL — ABNORMAL LOW (ref 70–99)
Glucose-Capillary: 74 mg/dL (ref 70–99)
Glucose-Capillary: 88 mg/dL (ref 70–99)

## 2014-01-22 LAB — COMPREHENSIVE METABOLIC PANEL
ALBUMIN: 2 g/dL — AB (ref 3.5–5.2)
ALK PHOS: 148 U/L — AB (ref 39–117)
ALT: 126 U/L — AB (ref 0–35)
AST: 152 U/L — ABNORMAL HIGH (ref 0–37)
Anion gap: 11 (ref 5–15)
BUN: 42 mg/dL — ABNORMAL HIGH (ref 6–23)
CHLORIDE: 120 meq/L — AB (ref 96–112)
CO2: 24 mEq/L (ref 19–32)
Calcium: 8.1 mg/dL — ABNORMAL LOW (ref 8.4–10.5)
Creatinine, Ser: 1.48 mg/dL — ABNORMAL HIGH (ref 0.50–1.10)
GFR calc Af Amer: 35 mL/min — ABNORMAL LOW (ref >90)
GFR calc non Af Amer: 31 mL/min — ABNORMAL LOW (ref >90)
Glucose, Bld: 166 mg/dL — ABNORMAL HIGH (ref 70–99)
POTASSIUM: 3.8 meq/L (ref 3.7–5.3)
SODIUM: 155 meq/L — AB (ref 137–147)
Total Bilirubin: 0.5 mg/dL (ref 0.3–1.2)
Total Protein: 5.8 g/dL — ABNORMAL LOW (ref 6.0–8.3)

## 2014-01-22 LAB — LACTIC ACID, PLASMA: Lactic Acid, Venous: 1.7 mmol/L (ref 0.5–2.2)

## 2014-01-22 LAB — OSMOLALITY, URINE: Osmolality, Ur: 432 mOsm/kg (ref 390–1090)

## 2014-01-22 LAB — MRSA PCR SCREENING: MRSA BY PCR: NEGATIVE

## 2014-01-22 LAB — POCT I-STAT 3, ART BLOOD GAS (G3+)
ACID-BASE DEFICIT: 2 mmol/L (ref 0.0–2.0)
Bicarbonate: 23.6 mEq/L (ref 20.0–24.0)
O2 SAT: 100 %
PO2 ART: 215 mmHg — AB (ref 80.0–100.0)
Patient temperature: 98.8
TCO2: 25 mmol/L (ref 0–100)
pCO2 arterial: 45.9 mmHg — ABNORMAL HIGH (ref 35.0–45.0)
pH, Arterial: 7.319 — ABNORMAL LOW (ref 7.350–7.450)

## 2014-01-22 MED ORDER — ENOXAPARIN SODIUM 30 MG/0.3ML ~~LOC~~ SOLN
30.0000 mg | Freq: Every day | SUBCUTANEOUS | Status: DC
  Administered 2014-01-22 – 2014-01-27 (×6): 30 mg via SUBCUTANEOUS
  Filled 2014-01-22 (×6): qty 0.3

## 2014-01-22 MED ORDER — POTASSIUM CHLORIDE 2 MEQ/ML IV SOLN
INTRAVENOUS | Status: DC
  Administered 2014-01-22 (×2): via INTRAVENOUS
  Filled 2014-01-22 (×4): qty 1000

## 2014-01-22 MED ORDER — ONDANSETRON HCL 4 MG/2ML IJ SOLN: 4.0000 mg | Freq: Once | INTRAMUSCULAR | Status: AC | PRN

## 2014-01-22 MED ORDER — FENTANYL CITRATE 0.05 MG/ML IJ SOLN
50.0000 ug | INTRAMUSCULAR | Status: DC | PRN
  Administered 2014-01-22: 50 ug via INTRAVENOUS
  Filled 2014-01-22: qty 2

## 2014-01-22 MED ORDER — DEXTROSE 50 % IV SOLN
INTRAVENOUS | Status: AC
Start: 2014-01-22 — End: 2014-01-22
  Filled 2014-01-22: qty 50

## 2014-01-22 MED ORDER — FENTANYL CITRATE 0.05 MG/ML IJ SOLN: 25.0000 ug | INTRAMUSCULAR | Status: DC | PRN

## 2014-01-22 MED ORDER — DEXTROSE 50 % IV SOLN
25.0000 mL | Freq: Once | INTRAVENOUS | Status: AC | PRN
  Administered 2014-01-22: 25 mL via INTRAVENOUS

## 2014-01-22 MED ORDER — SODIUM CHLORIDE 0.45 % IV SOLN
INTRAVENOUS | Status: DC
  Administered 2014-01-22 (×2): via INTRAVENOUS

## 2014-01-22 MED ORDER — CLINDAMYCIN PHOSPHATE 600 MG/50ML IV SOLN
600.0000 mg | Freq: Three times a day (TID) | INTRAVENOUS | Status: DC
  Administered 2014-01-22 (×2): 600 mg via INTRAVENOUS
  Filled 2014-01-22 (×4): qty 50

## 2014-01-22 MED ORDER — INSULIN ASPART 100 UNIT/ML ~~LOC~~ SOLN: 0.0000 [IU] | SUBCUTANEOUS | Status: DC

## 2014-01-22 NOTE — Procedures (Signed)
Extubation Procedure Note  Patient Details:   Name: Michelle Hooper DOB: 17-Jun-1925 MRN: [id number]   Airway Documentation:     Evaluation  O2 sats: stable throughout Complications: No apparent complications Patient did tolerate procedure well. Bilateral Breath Sounds: Clear;Diminished Suctioning: Oral;Airway Yes Patient extubated to 4lnc. RN at bedside. No complications. Vital signs stable at this time. RT will continue to monitor.  Michelle Hooper, Harli Engelken Williams 01/22/2014, 11:31 AM

## 2014-01-22 NOTE — Consult Note (Addendum)
WOC wound consult note Reason for Consult: WOC consult requested for sacrum wound.  [facility] following for assessment and plan of care.  Pt went to the OR yesterday for surgical debridement.  Dr Corliss Skainssuei at the bedside for the first post-op dressing change. Wound type: Full thickness post-op wound; 8X11X3cm with undermining to 4 cm from 7:00 o'clock to 11:00 o'clock. 20% dark red, 80% beefy red.  No odor, mod amt pink drainage.   Right hip with previous deep tissue injury which has evolved into stage 2 in areas; 50% dark reddish-purple, 50% red moist stage 2 wound.  Small amt pink drainage, no odor.  3X3X.1cm Left hip with stage 2 wound; .3X.3X.1cm, pink and moist with minimal amt yellow drainage. Pressure Ulcer POA: Yes Dressing procedure/placement/frequency: Discussed plan of care with Dr Corliss Skainssuei; he [facility] require [facility].  Dakins dressings have been ordered by [facility] for sacral wound and dressing changes will be performed by the bedside nurses.  Foam dressings to left and right hip to promote healing.  Pt is on Sport low-airloss bed to reduce pressure. Please re-consult if further assistance is needed.  Thank-you,  Cammie Mcgeeawn Zoie Sarin MSN, RN, CWOCN, HarlanWCN-AP, CNS [phone]

## 2014-01-22 NOTE — Progress Notes (Signed)
1 Day Post-Op  Subjective: Patient remains intubated, mildly agitated   Objective: Vital signs in last 24 hours: Temp:  [98.3 F (36.8 C)-103.9 F (39.9 C)] 99.5 F (37.5 C) (07/08 0935) Pulse Rate:  [66-133] 86 (07/08 0935) Resp:  [15-44] 26 (07/08 0935) BP: (87-145)/(46-108) 112/57 mmHg (07/08 0935) SpO2:  [97 %-100 %] 100 % (07/08 0935) FiO2 (%):  [40 %] 40 % (07/08 0800) Weight:  [107 lb 2.3 oz (48.6 kg)-123 lb 14.4 oz (56.2 kg)] 107 lb 2.3 oz (48.6 kg) (07/07 2230)    Intake/Output from previous day: 07/07 0701 - 07/08 0700 In: 1592.7 [I.V.:1442.7; IV Piggyback:150] Out: 240 [Urine:210; Blood:30] Intake/Output this shift: Total I/O In: 250 [I.V.:250] Out: 20 [Urine:20]  Wound - no active bleeding; no necrotic tissue noted;   Lab Results:   Recent Labs  01/21/14 2000 01/22/14 0130  WBC 15.0* 14.6*  HGB 10.1* 9.3*  HCT 31.7* 29.1*  PLT 234 218   BMET  Recent Labs  01/21/14 1645 01/21/14 2000 01/22/14 0130  NA 157*  --  155*  K 4.3  --  3.8  CL 117*  --  120*  CO2 25  --  24  GLUCOSE 170*  --  166*  BUN 45*  --  42*  CREATININE 1.64* 1.56* 1.48*  CALCIUM 9.0  --  8.1*   PT/INR  Recent Labs  01/21/14 1645  LABPROT 15.0  INR 1.18   ABG  Recent Labs  01/22/14 0140  PHART 7.319*  HCO3 23.6    Studies/Results: Ct Abdomen Pelvis Wo Contrast  01/21/2014   CLINICAL DATA:  Abdominal pain, fever, sacral ulcers.  EXAM: CT ABDOMEN AND PELVIS WITHOUT CONTRAST  TECHNIQUE: Multidetector CT imaging of the abdomen and pelvis was performed following the standard protocol without IV contrast.  COMPARISON:  Abdominal radiograph January 21, 2014 and CT of the pelvis September 17, 2013  FINDINGS: Mild motion degraded examination.  LUNG BASES: Included view of the lung bases demonstrates left lung base pleural thickening atelectasis with granuloma. The heart appears mildly enlarged, with annular calcifications, included pericardium is nonsuspicious.  KIDNEYS/BLADDER:  Kidneys are orthotopic, demonstrating normal size and morphology. No nephrolithiasis, hydronephrosis; limited assessment for renal masses on this nonenhanced examination. The unopacified ureters are normal in course and caliber. Urinary bladder is partially distended, containing a Foley bulb and air.  SOLID ORGANS: The liver, spleen, pancreas and adrenal glands are unremarkable for this non-contrast examination. Status post cholecystectomy  GASTROINTESTINAL TRACT: The stomach, small and large bowel are normal in course and caliber without inflammatory changes, the sensitivity may be decreased by lack of enteric contrast. Moderate amount of retained large bowel stool to the level of the rectal vault, no bowel obstruction.  PERITONEUM/RETROPERITONEUM: No intraperitoneal free fluid nor free air. Ectatic infrarenal aorta to 2 cm, with severe calcific atherosclerosis. No lymphadenopathy by CT size criteria. Internal reproductive organs are unremarkable.  SOFT TISSUES/ OSSEOUS STRUCTURES: Inflammatory changes of the left gluteal sulcus with subcutaneous gas along the sacral soft tissues extending into the left gluteus musculature. Probable midline sacral ulcer, in addition to inflammatory changes of the skin and subcutaneous fat without discrete fluid collection.  Suture material along anterior abdominal wall. Patient is osteopenic. Severe degenerative change of the sacroiliac joints.  IMPRESSION: Inflammatory change of the gluteal soft tissues with suspected midline sacral ulcer, superimposed subcutaneous emphysema extending into the left gluteal musculature ; though this could reflect deep extent of ulceration, superimposed necrotizing fasciitis could have a similar appearance. No  intraperitoneal extent. No discrete abscess by noncontrast CT.  Moderate amount of retained large bowel stool without obstruction.  Findings discussed with and reconfirmed by Dr.STEPHEN RANCOUR on7/7/2015at6:59 PM.   Electronically Signed    By: Awilda Metroourtnay  Bloomer   On: 01/21/2014 19:00   Dg Chest 1 View  01/21/2014   CLINICAL DATA:  Altered mental status.  EXAM: CHEST - 1 VIEW  COMPARISON:  09/17/2013  FINDINGS: Stable elevation of the left hemidiaphragm. Cardiomegaly. Chronic densities at the left lung base, likely scarring. Right lung is clear. No effusions. No acute bony abnormality.  Stable impression on the right side of the trachea, likely tortuous vasculature or right thyroid goiter.  IMPRESSION: Left basilar scarring. Stable chronic elevation of the left hemidiaphragm. Mild cardiomegaly. No acute findings.   Electronically Signed   By: Charlett NoseKevin  Dover M.D.   On: 01/21/2014 16:25   Dg Abd 1 View  01/21/2014   CLINICAL DATA:  Altered mental status.  EXAM: ABDOMEN - 1 VIEW  COMPARISON:  CT 09/17/2013  FINDINGS: Nonobstructive bowel gas pattern. Moderate stool throughout the colon. Mild gaseous distention of the colon. No organomegaly or suspicious calcification. Dense vascular calcifications throughout the aorta and iliac vessels. No visible aneurysm.  Degenerative changes in the hips bilaterally.  Diffuse osteopenia.  IMPRESSION: No evidence of bowel obstruction or free air. Large stool burden with mild gaseous distention of the colon.   Electronically Signed   By: Charlett NoseKevin  Dover M.D.   On: 01/21/2014 16:26   Ct Head Wo Contrast  01/21/2014   CLINICAL DATA:  Altered mental status  EXAM: CT HEAD WITHOUT CONTRAST  TECHNIQUE: Contiguous axial images were obtained from the base of the skull through the vertex without intravenous contrast.  COMPARISON:  September 17, 2013  FINDINGS: There is moderately severe diffuse atrophy, stable. There is no appreciable mass, hemorrhage, extra-axial fluid collection, or midline shift. There is mild small vessel disease in the centra semiovale bilaterally. No acute appearing infarct identified.  Bony calvarium appears intact.  The mastoid air cells are clear.  IMPRESSION: Diffuse atrophy with mild periventricular small  vessel disease, stable. No intracranial mass, hemorrhage, or acute appearing infarct.   Electronically Signed   By: Bretta BangWilliam  Woodruff M.D.   On: 01/21/2014 16:26   Dg Chest Port 1 View  01/22/2014   CLINICAL DATA:  Shortness of breath  EXAM: PORTABLE CHEST - 1 VIEW  COMPARISON:  01/21/2014  FINDINGS: The cardiac shadow is stable. Elevation of left hemidiaphragm is again noted with blunting of the left costophrenic angle and left basilar atelectasis. The right lung remains clear. An endotracheal tube is noted 3.3 cm above the carina. A nasogastric catheter courses into the stomach. No bony abnormality is seen.  IMPRESSION: Tubes and lines as described.  Chronic changes in the left lung base.   Electronically Signed   By: Alcide CleverMark  Lukens M.D.   On: 01/22/2014 07:24   Dg Chest Port 1 View  01/22/2014   CLINICAL DATA:  Sepsis. Acute respiratory failure. Endotracheal tube and orogastric tube placement.  EXAM: PORTABLE CHEST - 1 VIEW  COMPARISON:  01/21/2014  FINDINGS: Endotracheal tube is now seen with tip 3 cm above the carina. Nasogastric tube is seen with tip in the stomach.  Elevation of left hemidiaphragm is again demonstrated with left basilar scarring. No evidence of pulmonary consolidation or edema. Right lung is clear. Patient is rotated to the left. Heart size is stable.  IMPRESSION: Endotracheal tube and nasogastric tube in appropriate position.  Stable elevated left hemidiaphragm and left basilar scarring.   Electronically Signed   By: Myles RosenthalJohn  Stahl M.D.   On: 01/22/2014 00:20    Anti-infectives: Anti-infectives   Start     Dose/Rate Route Frequency Ordered Stop   01/23/14 1600  vancomycin (VANCOCIN) IVPB 1000 mg/200 mL premix     1,000 mg 200 mL/hr over 60 Minutes Intravenous Every 48 hours 01/21/14 1738     01/22/14 0015  clindamycin (CLEOCIN) IVPB 600 mg  Status:  Discontinued     600 mg 100 mL/hr over 30 Minutes Intravenous 3 times per day 01/22/14 0009 01/22/14 0955   01/21/14 2130   piperacillin-tazobactam (ZOSYN) IVPB 2.25 g     2.25 g 100 mL/hr over 30 Minutes Intravenous Every 6 hours 01/21/14 1738     01/21/14 1500  piperacillin-tazobactam (ZOSYN) IVPB 3.375 g     3.375 g 100 mL/hr over 30 Minutes Intravenous  Once 01/21/14 1459 01/21/14 1543   01/21/14 1500  vancomycin (VANCOCIN) IVPB 1000 mg/200 mL premix     1,000 mg 200 mL/hr over 60 Minutes Intravenous  Once 01/21/14 1459 01/21/14 1650      Assessment/Plan: s/p Procedure(s): IRRIGATION AND DEBRIDEMENT Sacral Decubitus Ulcer (N/A) Continue dressing changes - wet to dry with Dakins  LOS: 1 day    Tanesia Butner K. 01/22/2014

## 2014-01-22 NOTE — Progress Notes (Signed)
Wasted 150 ml of fentanyl in sink. Lamount CrankerKelli Hunt witnessed waste.

## 2014-01-22 NOTE — Progress Notes (Signed)
PULMONARY / CRITICAL CARE MEDICINE   Name: Michelle Hooper: [id number] DOB: 1924/11/15    ADMISSION DATE:  01/21/2014 CONSULTATION DATE:  01/21/14  REFERRING MD :  Axel FillerArmando Ramirez (General Surgery) PRIMARY SERVICE: TRH > PCCM  CHIEF COMPLAINT:  Unable to wean off of mechanical ventilation post-op  BRIEF PATIENT DESCRIPTION: 80 y.o F with PMH chronic dementia, HTN, presented with sepsis likely secondary to large necrotic stage-IV sacral decub ulcer with subcutaneous air and concern for nec fasc on CT. Surgery performed Irrigate/Debride in OR on 7/7. Intubated in OR for procedure, unable to wean off vent in PACU. PCCM consulted for continued mech vent management.  SIGNIFICANT EVENTS / STUDIES:  7/7 - Admitted. OR for irrigation/debride sacral decub 8x9 cm. Intubated, unable to wean in PACU. 7/7 - CT Abd / Pelvis: midline sacral ulcer, superimposed subq emphysema, concern superimposed nec fasc, without discrete abscess  LINES / TUBES: ETT 7/7 >> 7/08   CULTURES: Blood, 7/7 >>  Urine 7/7 >>   ANTIBIOTICS: Clindamycin 7/7 >> 7/08 Vanc 7/7 >>  Zosyn 7/7 >>    SUBJECTIVE: Passed SBT. RASS -1. Not F/C.  VITAL SIGNS: Temp:  [98.3 F (36.8 C)-103.9 F (39.9 C)] 99.3 F (37.4 C) (07/08 1300) Pulse Rate:  [66-133] 84 (07/08 1300) Resp:  [12-44] 12 (07/08 1300) BP: (86-145)/(45-108) 113/55 mmHg (07/08 1300) SpO2:  [97 %-100 %] 100 % (07/08 1300) FiO2 (%):  [40 %] 40 % (07/08 0800) Weight:  [48.6 kg (107 lb 2.3 oz)-56.2 kg (123 lb 14.4 oz)] 48.6 kg (107 lb 2.3 oz) (07/07 2230) HEMODYNAMICS:   VENTILATOR SETTINGS: Vent Mode:  [-] CPAP;PSV FiO2 (%):  [40 %] 40 % Set Rate:  [15 bmp] 15 bmp Vt Set:  [350 mL] 350 mL PEEP:  [5 cmH20] 5 cmH20 Pressure Support:  [5 cmH20] 5 cmH20 Plateau Pressure:  [14 cmH20-15 cmH20] 15 cmH20 INTAKE / OUTPUT: Intake/Output     07/07 0701 - 07/08 0700 07/08 0701 - 07/09 0700   I.V. (mL/kg) 1442.7 (29.7) 600 (12.3)   IV Piggyback 150 50   Total  Intake(mL/kg) 1592.7 (32.8) 650 (13.4)   Urine (mL/kg/hr) 210 140 (0.4)   Blood 30    Total Output 240 140   Net +1352.7 +510          PHYSICAL EXAMINATION: General:  NAD. RASS -1. Not F/C Neuro:  No focal deficits HEENT: WNL Cardiovascular:  RRR, + syst M Lungs:  Clear anteriorly Abdomen:  Soft, NTND, +BS Ext: warm, no edema  LABS: I have reviewed all of today's lab results. Relevant abnormalities are discussed in the A/P section   CXR: LLL atx  ASSESSMENT / PLAN:  PULMONARY A: Acute resp failure P:   Extubate Supp O2 Monitor in ICU  CARDIOVASCULAR A: Hypotension, resolved H/O hypertension P:  Holding antihypertensives Monitor  RENAL A:  Hypernatremia Lactic acidosis, resolved P:   IVFs adjusted Monitor BMET intermittently Monitor I/Os Correct electrolytes as indicated   GASTROINTESTINAL A:   No acute issues P:   SUP:  N/I post extubation NPO until cognition permits PO intake  HEMATOLOGIC A:   Anemia without acute bleeding P:  DVT px: LMWH Monitor CBC intermittently Transfuse per usual ICU guidelines   INFECTIOUS A:   Severe sacral decubitus ulcer, S/P debridement P:   Micro and abx as above WOC involved Will likely need LTACH to provide wound care  ENDOCRINE A:   Episodic hypoglycemia P:   DC SSI Dextrose in IVFs  NEUROLOGIC A:  Dementia Post op pain P:   DC sedation protocol PRN fentanyl  TODAY'S SUMMARY:    I have personally obtained a history, examined the patient, evaluated laboratory and imaging results, formulated the assessment and plan and placed orders. CRITICAL CARE: The patient is critically ill with multiple organ systems failure and requires high complexity decision making for assessment and support, frequent evaluation and titration of therapies, application of advanced monitoring technologies and extensive interpretation of multiple databases. Critical Care Time devoted to patient care services described in  this note is 35 minutes.    Michelle Fischeravid Simonds, MD ; [facility] service Mobile [phone].  After 5:30 PM or weekends, call [phone]  01/22/2014, 1:47 PM

## 2014-01-23 LAB — CBC
HCT: 27.1 % — ABNORMAL LOW (ref 36.0–46.0)
HEMOGLOBIN: 8.8 g/dL — AB (ref 12.0–15.0)
MCH: 25.9 pg — ABNORMAL LOW (ref 26.0–34.0)
MCHC: 32.5 g/dL (ref 30.0–36.0)
MCV: 79.7 fL (ref 78.0–100.0)
Platelets: 223 10*3/uL (ref 150–400)
RBC: 3.4 MIL/uL — ABNORMAL LOW (ref 3.87–5.11)
RDW: 14.3 % (ref 11.5–15.5)
WBC: 17.6 10*3/uL — ABNORMAL HIGH (ref 4.0–10.5)

## 2014-01-23 LAB — COMPREHENSIVE METABOLIC PANEL
ALBUMIN: 1.9 g/dL — AB (ref 3.5–5.2)
ALT: 160 U/L — AB (ref 0–35)
AST: 211 U/L — ABNORMAL HIGH (ref 0–37)
Alkaline Phosphatase: 165 U/L — ABNORMAL HIGH (ref 39–117)
Anion gap: 13 (ref 5–15)
BUN: 33 mg/dL — ABNORMAL HIGH (ref 6–23)
CO2: 21 mEq/L (ref 19–32)
Calcium: 8.5 mg/dL (ref 8.4–10.5)
Chloride: 117 mEq/L — ABNORMAL HIGH (ref 96–112)
Creatinine, Ser: 1.4 mg/dL — ABNORMAL HIGH (ref 0.50–1.10)
GFR calc Af Amer: 38 mL/min — ABNORMAL LOW (ref >90)
GFR calc non Af Amer: 33 mL/min — ABNORMAL LOW (ref >90)
Glucose, Bld: 91 mg/dL (ref 70–99)
POTASSIUM: 4.3 meq/L (ref 3.7–5.3)
Sodium: 151 mEq/L — ABNORMAL HIGH (ref 137–147)
TOTAL PROTEIN: 5.9 g/dL — AB (ref 6.0–8.3)
Total Bilirubin: 0.5 mg/dL (ref 0.3–1.2)

## 2014-01-23 LAB — GLUCOSE, CAPILLARY
GLUCOSE-CAPILLARY: 116 mg/dL — AB (ref 70–99)
Glucose-Capillary: 75 mg/dL (ref 70–99)

## 2014-01-23 MED ORDER — SODIUM CHLORIDE 0.9 % IV SOLN
500.0000 mg | INTRAVENOUS | Status: DC
  Administered 2014-01-23 – 2014-01-25 (×3): 500 mg via INTRAVENOUS
  Filled 2014-01-23 (×3): qty 500

## 2014-01-23 MED ORDER — FENTANYL CITRATE 0.05 MG/ML IJ SOLN
12.5000 ug | INTRAMUSCULAR | Status: DC | PRN
  Filled 2014-01-23: qty 2

## 2014-01-23 MED ORDER — DEXTROSE 5 % IV SOLN
INTRAVENOUS | Status: DC
  Administered 2014-01-23 – 2014-01-24 (×2): via INTRAVENOUS
  Administered 2014-01-25: 250 mL via INTRAVENOUS
  Administered 2014-01-25: 10 mL via INTRAVENOUS

## 2014-01-23 MED ORDER — BISACODYL 10 MG RE SUPP
10.0000 mg | Freq: Every day | RECTAL | Status: DC | PRN
  Administered 2014-01-23: 10 mg via RECTAL
  Filled 2014-01-23: qty 1

## 2014-01-23 NOTE — Progress Notes (Signed)
Clinical Social Work Department CLINICAL SOCIAL WORK PLACEMENT NOTE 01/23/2014  Patient:  Michelle Hooper,Michelle Hooper  Account Number:  [id number] Admit date:  01/21/2014  Clinical Social Worker:  Maryclare LabradorJULIE Christalynn Boise, Theresia MajorsLCSWA  Date/time:  01/23/2014 02:19 PM  Clinical Social Work is seeking post-discharge placement for this patient at the following level of care:   SKILLED NURSING   (*CSW will update this form in Epic as items are completed)   N/A-got permission from daughter over the phone to make referrals to all SNFs in [place] except [facility]  Patient/family provided with [facility] Department of Clinical Social Work's list of facilities offering this level of care within the geographic area requested by the patient (or if unable, by the patient's family).  01/23/2014  Patient/family informed of their freedom to choose among providers that offer the needed level of care, that participate in Medicare, Medicaid or managed care program needed by the patient, have an available bed and are willing to accept the patient.  N/A-not sent   Patient/family informed of [facility]' ownership interest in [facility], as well as of the fact that they are under no obligation to receive care at this facility.  PASARR submitted to EDS on EXISTING PASARR number received on EXISTING  FL2 transmitted to all facilities in geographic area requested by pt/family on  01/23/2014 FL2 transmitted to all facilities within larger geographic area on   Patient informed that his/her managed care company has contracts with or will negotiate with  certain facilities, including the following:     Patient/family informed of bed offers received:   Patient chooses bed at  Physician recommends and patient chooses bed at    Patient to be transferred to  on   Patient to be transferred to facility by  Patient and family notified of transfer on  Name of family member notified:    The following physician request were  entered in Epic:   Additional Comments:   Maryclare LabradorJulie Shalicia Craghead, MSW, [facility]SWA Clinical Social Worker [phone]

## 2014-01-23 NOTE — Progress Notes (Signed)
2 Days Post-Op  Subjective: Patient awake, moaning with repositioning  Objective: Vital signs in last 24 hours: Temp:  [96.9 F (36.1 C)-99.8 F (37.7 C)] 99 F (37.2 C) (07/09 0900) Pulse Rate:  [83-111] 102 (07/09 0900) Resp:  [12-28] 21 (07/09 0900) BP: (86-154)/(45-100) 135/60 mmHg (07/09 0900) SpO2:  [93 %-100 %] 100 % (07/09 0900) Weight:  [109 lb 9.1 oz (49.7 kg)] 109 lb 9.1 oz (49.7 kg) (07/09 0500) Last BM Date:  (unknown-PTA)  Intake/Output from previous day: 07/08 0701 - 07/09 0700 In: 2153 [I.V.:1953; IV Piggyback:200] Out: 965 [Urine:965] Intake/Output this shift: Total I/O In: 300 [I.V.:150; IV Piggyback:150] Out: 100 [Urine:100]  Wound with some fibrinous exudate, no signs of necrosis; minimal drainage  Lab Results:   Recent Labs  01/22/14 0130 01/23/14 0230  WBC 14.6* 17.6*  HGB 9.3* 8.8*  HCT 29.1* 27.1*  PLT 218 223   BMET  Recent Labs  01/22/14 0130 01/23/14 0230  NA 155* 151*  K 3.8 4.3  CL 120* 117*  CO2 24 21  GLUCOSE 166* 91  BUN 42* 33*  CREATININE 1.48* 1.40*  CALCIUM 8.1* 8.5   PT/INR  Recent Labs  01/21/14 1645  LABPROT 15.0  INR 1.18   ABG  Recent Labs  01/22/14 0140  PHART 7.319*  HCO3 23.6    Studies/Results: Ct Abdomen Pelvis Wo Contrast  01/21/2014   CLINICAL DATA:  Abdominal pain, fever, sacral ulcers.  EXAM: CT ABDOMEN AND PELVIS WITHOUT CONTRAST  TECHNIQUE: Multidetector CT imaging of the abdomen and pelvis was performed following the standard protocol without IV contrast.  COMPARISON:  Abdominal radiograph January 21, 2014 and CT of the pelvis September 17, 2013  FINDINGS: Mild motion degraded examination.  LUNG BASES: Included view of the lung bases demonstrates left lung base pleural thickening atelectasis with granuloma. The heart appears mildly enlarged, with annular calcifications, included pericardium is nonsuspicious.  KIDNEYS/BLADDER: Kidneys are orthotopic, demonstrating normal size and morphology. No  nephrolithiasis, hydronephrosis; limited assessment for renal masses on this nonenhanced examination. The unopacified ureters are normal in course and caliber. Urinary bladder is partially distended, containing a Foley bulb and air.  SOLID ORGANS: The liver, spleen, pancreas and adrenal glands are unremarkable for this non-contrast examination. Status post cholecystectomy  GASTROINTESTINAL TRACT: The stomach, small and large bowel are normal in course and caliber without inflammatory changes, the sensitivity may be decreased by lack of enteric contrast. Moderate amount of retained large bowel stool to the level of the rectal vault, no bowel obstruction.  PERITONEUM/RETROPERITONEUM: No intraperitoneal free fluid nor free air. Ectatic infrarenal aorta to 2 cm, with severe calcific atherosclerosis. No lymphadenopathy by CT size criteria. Internal reproductive organs are unremarkable.  SOFT TISSUES/ OSSEOUS STRUCTURES: Inflammatory changes of the left gluteal sulcus with subcutaneous gas along the sacral soft tissues extending into the left gluteus musculature. Probable midline sacral ulcer, in addition to inflammatory changes of the skin and subcutaneous fat without discrete fluid collection.  Suture material along anterior abdominal wall. Patient is osteopenic. Severe degenerative change of the sacroiliac joints.  IMPRESSION: Inflammatory change of the gluteal soft tissues with suspected midline sacral ulcer, superimposed subcutaneous emphysema extending into the left gluteal musculature ; though this could reflect deep extent of ulceration, superimposed necrotizing fasciitis could have a similar appearance. No intraperitoneal extent. No discrete abscess by noncontrast CT.  Moderate amount of retained large bowel stool without obstruction.  Findings discussed with and reconfirmed by Dr.STEPHEN RANCOUR on7/7/2015at6:59 PM.   Electronically Signed  By: Awilda Metro   On: 01/21/2014 19:00   Dg Chest 1  View  01/21/2014   CLINICAL DATA:  Altered mental status.  EXAM: CHEST - 1 VIEW  COMPARISON:  09/17/2013  FINDINGS: Stable elevation of the left hemidiaphragm. Cardiomegaly. Chronic densities at the left lung base, likely scarring. Right lung is clear. No effusions. No acute bony abnormality.  Stable impression on the right side of the trachea, likely tortuous vasculature or right thyroid goiter.  IMPRESSION: Left basilar scarring. Stable chronic elevation of the left hemidiaphragm. Mild cardiomegaly. No acute findings.   Electronically Signed   By: Charlett Nose M.D.   On: 01/21/2014 16:25   Dg Abd 1 View  01/21/2014   CLINICAL DATA:  Altered mental status.  EXAM: ABDOMEN - 1 VIEW  COMPARISON:  CT 09/17/2013  FINDINGS: Nonobstructive bowel gas pattern. Moderate stool throughout the colon. Mild gaseous distention of the colon. No organomegaly or suspicious calcification. Dense vascular calcifications throughout the aorta and iliac vessels. No visible aneurysm.  Degenerative changes in the hips bilaterally.  Diffuse osteopenia.  IMPRESSION: No evidence of bowel obstruction or free air. Large stool burden with mild gaseous distention of the colon.   Electronically Signed   By: Charlett Nose M.D.   On: 01/21/2014 16:26   Ct Head Wo Contrast  01/21/2014   CLINICAL DATA:  Altered mental status  EXAM: CT HEAD WITHOUT CONTRAST  TECHNIQUE: Contiguous axial images were obtained from the base of the skull through the vertex without intravenous contrast.  COMPARISON:  September 17, 2013  FINDINGS: There is moderately severe diffuse atrophy, stable. There is no appreciable mass, hemorrhage, extra-axial fluid collection, or midline shift. There is mild small vessel disease in the centra semiovale bilaterally. No acute appearing infarct identified.  Bony calvarium appears intact.  The mastoid air cells are clear.  IMPRESSION: Diffuse atrophy with mild periventricular small vessel disease, stable. No intracranial mass, hemorrhage, or  acute appearing infarct.   Electronically Signed   By: Bretta Bang M.D.   On: 01/21/2014 16:26   Dg Chest Port 1 View  01/22/2014   CLINICAL DATA:  Shortness of breath  EXAM: PORTABLE CHEST - 1 VIEW  COMPARISON:  01/21/2014  FINDINGS: The cardiac shadow is stable. Elevation of left hemidiaphragm is again noted with blunting of the left costophrenic angle and left basilar atelectasis. The right lung remains clear. An endotracheal tube is noted 3.3 cm above the carina. A nasogastric catheter courses into the stomach. No bony abnormality is seen.  IMPRESSION: Tubes and lines as described.  Chronic changes in the left lung base.   Electronically Signed   By: Alcide Clever M.D.   On: 01/22/2014 07:24   Dg Chest Port 1 View  01/22/2014   CLINICAL DATA:  Sepsis. Acute respiratory failure. Endotracheal tube and orogastric tube placement.  EXAM: PORTABLE CHEST - 1 VIEW  COMPARISON:  01/21/2014  FINDINGS: Endotracheal tube is now seen with tip 3 cm above the carina. Nasogastric tube is seen with tip in the stomach.  Elevation of left hemidiaphragm is again demonstrated with left basilar scarring. No evidence of pulmonary consolidation or edema. Right lung is clear. Patient is rotated to the left. Heart size is stable.  IMPRESSION: Endotracheal tube and nasogastric tube in appropriate position.  Stable elevated left hemidiaphragm and left basilar scarring.   Electronically Signed   By: Myles Rosenthal M.D.   On: 01/22/2014 00:20    Anti-infectives: Anti-infectives   Start  Dose/Rate Route Frequency Ordered Stop   01/23/14 1600  vancomycin (VANCOCIN) IVPB 1000 mg/200 mL premix  Status:  Discontinued     1,000 mg 200 mL/hr over 60 Minutes Intravenous Every 48 hours 01/21/14 1738 01/23/14 0723   01/23/14 0800  vancomycin (VANCOCIN) 500 mg in sodium chloride 0.9 % 100 mL IVPB     500 mg 100 mL/hr over 60 Minutes Intravenous Every 24 hours 01/23/14 0723     01/22/14 0015  clindamycin (CLEOCIN) IVPB 600 mg   Status:  Discontinued     600 mg 100 mL/hr over 30 Minutes Intravenous 3 times per day 01/22/14 0009 01/22/14 0955   01/21/14 2130  piperacillin-tazobactam (ZOSYN) IVPB 2.25 g     2.25 g 100 mL/hr over 30 Minutes Intravenous Every 6 hours 01/21/14 1738     01/21/14 1500  piperacillin-tazobactam (ZOSYN) IVPB 3.375 g     3.375 g 100 mL/hr over 30 Minutes Intravenous  Once 01/21/14 1459 01/21/14 1543   01/21/14 1500  vancomycin (VANCOCIN) IVPB 1000 mg/200 mL premix     1,000 mg 200 mL/hr over 60 Minutes Intravenous  Once 01/21/14 1459 01/21/14 1650      Assessment/Plan: s/p Procedure(s): IRRIGATION AND DEBRIDEMENT Sacral Decubitus Ulcer (N/A) Continue dressing changes with Dakin's No further surgical debridement needed at this time. We will evaluate wound every other day. [facility]   LOS: 2 days    Joany Khatib K. 01/23/2014

## 2014-01-23 NOTE — Progress Notes (Signed)
Clinical Social Work Department BRIEF PSYCHOSOCIAL ASSESSMENT 01/23/2014  Patient:  Michelle Hooper,Michelle Hooper     Account Number:  [id number]     Admit date:  01/21/2014  Clinical Social Worker:  Varney BilesANDERSON,Purcell Jungbluth, LCSWA  Date/Time:  01/23/2014 02:04 PM  Referred by:  Physician  Date Referred:  01/23/2014 Referred for  SNF Placement   Other Referral:   Interview type:  Family Other interview type:    PSYCHOSOCIAL DATA Living Status:  FAMILY Admitted from facility:   Level of care:   Primary support name:  Haynes HoehnJosephine Hall [phone]) Primary support relationship to patient:  CHILD, ADULT Degree of support available:   Good--pt lives with daughter [name].    CURRENT CONCERNS Current Concerns  Post-Acute Placement   Other Concerns:    SOCIAL WORK ASSESSMENT / PLAN CSW explained referral for rehab to pt's daughter, who states pt has been to [facility] twice in the past. Daughter would not like patient to go to [facility], but was agreeable with CSW sending referral [place] SN[place], with the exception of [facility], at daughter's request.   Assessment/plan status:  Psychosocial Support/Ongoing Assessment of Needs Other assessment/ plan:   Information/referral to community resources:   SNF    PATIENT'S/FAMILY'S RESPONSE TO PLAN OF CARE: Good--pt's daughter understanding of CSW role in discharge plan.       Maryclare LabradorJulie Hannia Matchett, MSW, [facility]WA Clinical Social Worker [phone]

## 2014-01-23 NOTE — Clinical Documentation Improvement (Signed)
Clinical Documentation Clarification #1:   Possible Clinical Conditions?    Encephalopathy (describe type if known)                       Anoxic                       Septic                       Alcoholic                        Hepatic                       Hypertensive                       Metabolic                       Toxic Drug induced delirium  Hyponatremia / Hypernatremia Poisoning / Overdose Other Condition Cannot Clinically Determine    Risk Factors: Patient presented to the ED with AMS per 7/07 progress notes. History of chronic Alzheimer's dementia per 7/08 progress notes. ________________________________________________________________________________________________ Clinical Documentation Clarification #2:   Possible Clinical Conditions?   Acute Blood Loss Anemia Acute Blood Loss Anemia on Chronic Iron Deficiency Anemia Other Condition Cannot Clinically Determine   Risk Factors: Patient with a history of iron deficiency anemia per 7/08 progress notes.  Labs: H/H: 7/07:  11.1/34.5 7/09:    8.8/27.1  Thank You, Marciano SequinWanda Mathews-Bethea,RN,BSN, Clinical Documentation Specialist:  [phone]  [facility]- Health Information Management

## 2014-01-23 NOTE — Progress Notes (Signed)
ANTIBIOTIC CONSULT NOTE - FOLLOW UP  Pharmacy Consult:  Vancomycin / Zosyn Indication:  Rule out sepsis  No Known Allergies  Patient Measurements: Height: 4\' 11"  (149.9 cm) Weight: 109 lb 9.1 oz (49.7 kg) IBW/kg (Calculated) : 43.2  Vital Signs: Temp: 99.1 F (37.3 C) (07/09 0700) Temp src: Rectal (07/09 0000) BP: 132/73 mmHg (07/09 0700) Pulse Rate: 90 (07/09 0700) Intake/Output from previous day: 07/08 0701 - 07/09 0700 In: 2078 [I.V.:1878; IV Piggyback:200] Out: 965 [Urine:965]  Labs:  Recent Labs  01/21/14 2000 01/22/14 0130 01/23/14 0230  WBC 15.0* 14.6* 17.6*  HGB 10.1* 9.3* 8.8*  PLT 234 218 223  CREATININE 1.56* 1.48* 1.40*   Estimated Creatinine Clearance: 19.3 ml/min (by C-G formula based on Cr of 1.4). No results found for this basename: VANCOTROUGH, VANCOPEAK, VANCORANDOM, GENTTROUGH, GENTPEAK, GENTRANDOM, TOBRATROUGH, TOBRAPEAK, TOBRARND, AMIKACINPEAK, AMIKACINTROU, AMIKACIN,  in the last 72 hours   Microbiology: Recent Results (from the past 720 hour(s))  CULTURE, BLOOD (ROUTINE X 2)     Status: None   Collection Time    01/21/14  3:33 PM      Result Value Ref Range Status   Specimen Description BLOOD LEFT FOREARM   Final   Special Requests BOTTLES DRAWN AEROBIC ONLY 5CC   Final   Culture  Setup Time     Final   Value: 01/21/2014 22:23     Performed at [facility]   Culture     Final   Value:        BLOOD CULTURE RECEIVED NO GROWTH TO DATE CULTURE WILL BE HELD FOR 5 DAYS BEFORE ISSUING A FINAL NEGATIVE REPORT     Performed at [facility]   Report Status PENDING   Incomplete  CULTURE, BLOOD (ROUTINE X 2)     Status: None   Collection Time    01/21/14  4:45 PM      Result Value Ref Range Status   Specimen Description BLOOD HAND RIGHT   Final   Special Requests BOTTLES DRAWN AEROBIC AND ANAEROBIC 6CC   Final   Culture  Setup Time     Final   Value: 01/21/2014 22:23     Performed at [facility]   Culture     Final   Value:        BLOOD CULTURE RECEIVED NO GROWTH TO DATE CULTURE WILL BE HELD FOR 5 DAYS BEFORE ISSUING A FINAL NEGATIVE REPORT     Performed at [facility]   Report Status PENDING   Incomplete  URINE CULTURE     Status: None   Collection Time    01/21/14  5:14 PM      Result Value Ref Range Status   Specimen Description URINE, CATHETERIZED   Final   Special Requests NONE   Final   Culture  Setup Time     Final   Value: 01/21/2014 18:55     Performed at [facility]   Colony Count     Final   Value: NO GROWTH     Performed at [facility]   Culture     Final   Value: NO GROWTH     Performed at [facility]   Report Status 01/22/2014 FINAL   Final  MRSA PCR SCREENING     Status: None   Collection Time    01/21/14 10:18 PM      Result Value Ref Range Status   MRSA by PCR NEGATIVE  NEGATIVE Final  Comment:            The GeneXpert MRSA Assay (FDA     approved for NASAL specimens     only), is one component of a     comprehensive MRSA colonization     surveillance program. It is not     intended to diagnose MRSA     infection nor to guide or     monitor treatment for     MRSA infections.      Assessment: 89 YOF to continue on vancomycin and Zosyn for rule out sepsis, likely from decubitus ulcers.  Patient's renal function is gradually improving and expect it to continue to improve.  Good urine output.    Vanc 7/7 >> Zosyn 7/7 >> Clinda 7/8 >> 7/8  7/7 wound cx -  7/7 BCx x2 - NGTD 7/7 UCx - negative   Goal of Therapy:  Vancomycin trough level 15-20 mcg/ml   Plan:  - Continue Zosyn 2.25gm IV Q6H - Change vanc to 500mg  IV Q24H - Monitor renal fxn, clinical course, vanc trough at [facility]    Oluwatosin Higginson D. Laney Potash, PharmD, BCPS Pager:  [phone] 01/23/2014, 7:22 AM

## 2014-01-23 NOTE — Progress Notes (Signed)
PULMONARY / CRITICAL CARE MEDICINE   Name: Michelle Hooper MRN: [id number] DOB: [birth year]    ADMISSION DATE:  01/21/2014 CONSULTATION DATE:  01/21/14  REFERRING MD :  Axel Filler (General Surgery) PRIMARY SERVICE: TRH > PCCM  CHIEF COMPLAINT:  Unable to wean off of mechanical ventilation post-op  BRIEF PATIENT DESCRIPTION: 80 y.o F with PMH chronic dementia, HTN, presented with sepsis likely secondary to large necrotic stage-IV sacral decub ulcer with subcutaneous air and concern for nec fasc on CT. Surgery performed Irrigate/Debride in OR on 7/7. Intubated in OR for procedure, unable to wean off vent in PACU. PCCM consulted for continued mech vent management.  SIGNIFICANT EVENTS / STUDIES:  7/7 - Admitted. OR for irrigation/debride sacral decub 8x9 cm. Intubated, unable to wean in PACU. 7/7 - CT Abd / Pelvis: midline sacral ulcer, superimposed subq emphysema, concern superimposed nec fasc, without discrete abscess  LINES / TUBES: ETT 7/7 >> 7/08   CULTURES: Blood, 7/7 >>  Urine 7/7 >>   ANTIBIOTICS: Clindamycin 7/7 >> 7/08 Vanc 7/7 >>  Zosyn 7/7 >>    SUBJECTIVE: Has tolerated extubation. No respiratory distress. Poorly oriented.   VITAL SIGNS: Temp:  [96.9 F (36.1 C)-99.5 F (37.5 C)] 98.8 F (37.1 C) (07/09 1130) Pulse Rate:  [83-111] 87 (07/09 1130) Resp:  [13-28] 19 (07/09 1130) BP: (93-154)/(49-100) 118/82 mmHg (07/09 1130) SpO2:  [93 %-100 %] 100 % (07/09 1130) Weight:  [49.7 kg (109 lb 9.1 oz)] 49.7 kg (109 lb 9.1 oz) (07/09 0500) HEMODYNAMICS:   VENTILATOR SETTINGS:   INTAKE / OUTPUT: Intake/Output     07/08 0701 - 07/09 0700 07/09 0701 - 07/10 0700   I.V. (mL/kg) 1953 (39.3) 272.5 (5.5)   IV Piggyback 200 150   Total Intake(mL/kg) 2153 (43.3) 422.5 (8.5)   Urine (mL/kg/hr) 965 (0.8) 200 (0.6)   Blood     Total Output 965 200   Net +1188 +222.5          PHYSICAL EXAMINATION: General:  NAD. RASS 0 to -1. Poorly oriented Neuro:  No focal  deficits HEENT: WNL Cardiovascular:  RRR, + syst M Lungs:  Clear anteriorly Abdomen:  Soft, NTND, +BS Ext: warm, no edema  LABS: I have reviewed all of today's lab results. Relevant abnormalities are discussed in the A/P section   CXR: NNF  ASSESSMENT / PLAN:  PULMONARY A: Acute resp failure, resolved P:   Cont supplemental O2 Transfer to SDU  CARDIOVASCULAR A: Hypotension, resolved H/O hypertension - previously on clonidine P:  Holding antihypertensives Monitor BP and rhythm Consider resumption of clonidine if hypertension becomes a problem  RENAL A:  Hypernatremia, improving Lactic acidosis, resolved P:   Cont D5W Monitor BMET intermittently Monitor I/Os Correct electrolytes as indicated  GASTROINTESTINAL A:   No acute issues P:   SUP:  N/I post extubation Begin D3 with nectar thick and supervision  HEMATOLOGIC A:   Anemia without acute bloor loss P:  DVT px: LMWH Monitor CBC intermittently Transfuse per usual guidelines  INFECTIOUS A:   Severe sacral decubitus ulcer, S/P debridement P:   Micro and abx as above - consider dropping Vanc in next couple of days if cx's remain negative WOC involved Post op mgmt per CCS Will likely need LTACH to provide wound care  ENDOCRINE A:   Episodic hypoglycemia P:   DC SSI Cont dextrose in IVFs  NEUROLOGIC A:  Dementia Post op pain P:   Cont PRN fentanyl   Transfer to SDU. TRH to resume  care as of AM 7/10 and PCCM will sign off. Discussed with Dr Antony OdeaGhimire     David Simonds, MD ; [facility] service Mobile [phone].  After 5:30 PM or weekends, call [phone]  01/23/2014, 1:46 PM

## 2014-01-24 DIAGNOSIS — E870 Hyperosmolality and hypernatremia: Secondary | ICD-10-CM | POA: Diagnosis present

## 2014-01-24 DIAGNOSIS — N183 Chronic kidney disease, stage 3 unspecified: Secondary | ICD-10-CM | POA: Diagnosis present

## 2014-01-24 DIAGNOSIS — D649 Anemia, unspecified: Secondary | ICD-10-CM | POA: Diagnosis present

## 2014-01-24 DIAGNOSIS — L89154 Pressure ulcer of sacral region, stage 4: Secondary | ICD-10-CM | POA: Diagnosis present

## 2014-01-24 DIAGNOSIS — N179 Acute kidney failure, unspecified: Secondary | ICD-10-CM | POA: Diagnosis present

## 2014-01-24 DIAGNOSIS — I10 Essential (primary) hypertension: Secondary | ICD-10-CM | POA: Diagnosis present

## 2014-01-24 MED ORDER — AMLODIPINE BESYLATE 10 MG PO TABS
10.0000 mg | ORAL_TABLET | Freq: Every day | ORAL | Status: DC
  Filled 2014-01-24: qty 1

## 2014-01-24 MED ORDER — CLONIDINE HCL 0.2 MG/24HR TD PTWK
0.2000 mg | MEDICATED_PATCH | TRANSDERMAL | Status: DC
  Administered 2014-01-24: 0.2 mg via TRANSDERMAL
  Filled 2014-01-24: qty 1

## 2014-01-24 NOTE — Progress Notes (Signed)
INITIAL NUTRITION ASSESSMENT  DOCUMENTATION CODES Per approved criteria  -Severe malnutrition in the context of chronic illness   INTERVENTION:  Ensure Pudding PO TID to maximize oral intake of protein and calories, each supplement provides 170 kcal and 4 grams of protein  NUTRITION DIAGNOSIS: Increased nutrient needs related to stage 4 wound as evidenced by estimated calorie and protein needs.   Goal: Intake to meet >90% of estimated nutrition needs.  Monitor:  PO intake, labs, weight trend.  Reason for Assessment: Low Braden  80 y.o. female  Admitting Dx: sepsis, sacral decubitus ulcer, dehydration  ASSESSMENT: 80 y.o. year old female with history of end stage dementia, HTN presenting with sepsis, sacral decubitus ulcer. Per the caregiver, patient had decreased appetite for 2 days PTA. Admitted for debridement of infected stage 4 sacral decubitus ulcer.  S/P I&D of sacral decubitus ulcer on 7/7. Required post-op mechanical ventilation for a few hours. Was extubated on 7/8. Diet advanced to Dysphagia 3 with nectar thick liquids on 7/9. RN reports that PO intake is very poor. Breakfast tray at bedside, only a few bites taken. Requires assistance with feeding.   Pt meets criteria for severe MALNUTRITION in the context of chronic illness as evidenced by severe depletion of muscle mass with 12% weight loss in 6 months.   Nutrition Focused Physical Exam:  Subcutaneous Fat:  Orbital Region: WNL Upper Arm Region: NA Thoracic and Lumbar Region: NA  Muscle:  Temple Region: mild-moderate depletion Clavicle Bone Region: severe depletion Clavicle and Acromion Bone Region: mild-moderate depletion Scapular Bone Region: NA Dorsal Hand: NA Patellar Region: WNL Anterior Thigh Region: WNL Posterior Calf Region: WNL  Edema: none   Height: Ht Readings from Last 1 Encounters:  01/21/14 4\' 11"  (1.499 m)    Weight: Wt Readings from Last 1 Encounters:  01/23/14 109 lb 9.1 oz  (49.7 kg)    Ideal Body Weight: 44.5 kg  % Ideal Body Weight: 112%  Wt Readings from Last 10 Encounters:  01/23/14 109 lb 9.1 oz (49.7 kg)  01/23/14 109 lb 9.1 oz (49.7 kg)  09/18/13 124 lb (56.246 kg)    Usual Body Weight: 124 lb  % Usual Body Weight: 88%  BMI:  Body mass index is 22.12 kg/(m^2).  Estimated Nutritional Needs: Kcal: 1500-1700 Protein: 80-100 gm Fluid: 1.7 L  Skin: stage 4 sacral pressure ulcer, stage 2 pressure ulcer to right and left hip  Diet Order: Dysphagia 3 with nectar thick liquids  EDUCATION NEEDS: -Education not appropriate at this time   Intake/Output Summary (Last 24 hours) at 01/24/14 0857 Last data filed at 01/24/14 0700  Gross per 24 hour  Intake 1322.5 ml  Output    826 ml  Net  496.5 ml    Last BM: PTA   Labs:   Recent Labs Lab 01/21/14 1645 01/21/14 2000 01/22/14 0130 01/23/14 0230  NA 157*  --  155* 151*  K 4.3  --  3.8 4.3  CL 117*  --  120* 117*  CO2 25  --  24 21  BUN 45*  --  42* 33*  CREATININE 1.64* 1.56* 1.48* 1.40*  CALCIUM 9.0  --  8.1* 8.5  GLUCOSE 170*  --  166* 91    CBG (last 3)   Recent Labs  01/22/14 1858 01/22/14 2329 01/23/14 0613  GLUCAP 80 75 116*    Scheduled Meds: . antiseptic oral rinse  15 mL Mouth Rinse QID  . chlorhexidine  15 mL Mouth Rinse BID  .  enoxaparin (LOVENOX) injection  30 mg Subcutaneous Daily  . piperacillin-tazobactam (ZOSYN)  IV  2.25 g Intravenous Q6H  . sodium chloride  3 mL Intravenous Q12H  . vancomycin  500 mg Intravenous Q24H    Continuous Infusions: . dextrose 0 mL (01/23/14 1635)    Past Medical History  Diagnosis Date  . SBO (small bowel obstruction)   . Hypertension   . Alzheimer's dementia   . Iron deficiency anemia   . Frequent UTI     Past Surgical History  Procedure Laterality Date  . Exploratory laparotomy, lysis of adhesions, small bowel  07/30/10  . Irrigation and debridement abscess N/A 01/21/2014    Procedure: IRRIGATION AND  DEBRIDEMENT Sacral Decubitus Ulcer;  Surgeon: Axel Filler, MD;  Location: [facility] OR;  Service: General;  Laterality: N/A;    Joaquin Courts, RD, LDN, CNSC Pager [phone] After Hours Pager [phone]

## 2014-01-24 NOTE — Progress Notes (Signed)
Pt did have a non-sustained SVT, asymtomatic and went back to NSR, on call  K. Craige CottaKirby notified, she said since it was non-sustained, to just monitor pt and call her back if anything changes. Will continue to monitor pt---Rudie Sermons, rn

## 2014-01-24 NOTE — Anesthesia Postprocedure Evaluation (Signed)
  Anesthesia Post-op Note  Patient: Michelle Hooper  Procedure(s) Performed: Procedure(s): IRRIGATION AND DEBRIDEMENT Sacral Decubitus Ulcer (N/A)  Patient Location: ICU  Anesthesia Type:General  Level of Consciousness: sedated  Airway and Oxygen Therapy: Patient remains intubated per anesthesia plan  Post-op Pain: none  Post-op Assessment: Post-op Vital signs reviewed, Patient's Cardiovascular Status Stable and Respiratory Function Stable  Post-op Vital Signs: Reviewed and stable  Last Vitals:  Filed Vitals:   01/24/14 0400  BP: 153/76  Pulse: 96  Temp: 36.9 C  Resp: 21    Complications: No apparent anesthesia complications

## 2014-01-24 NOTE — Progress Notes (Signed)
[name]  Janila Arrazola UJW:[id number] DOB: [birth year] DOA: 01/21/2014 PCP: Jackie Plum, MD  Brief narrative: 80 year old female patient with known end-stage dementia and hypertension as well as large sacral decubitus. She presented to the emergency department with reports of decreased appetite and increasing agitation over 48 hours prior to presentation. Subjectively she felt warm to touch and family was concerned that she was febrile. On the date of admission patient had refused to eat. EMS was called.  Upon arrival to the emergency department patient was noted to be febrile with a temperature of 103.9. Heart rate variable between 90 and 120. Respirations were in the 40s. Her blood pressure was greater than 90 systolic. She was able to maintain room air saturations 97%. Her white count was elevated at 18,700, hemoglobin 11.1, sodium 137, creatinine 1.64 with a BUN of 45. Her lactate was also elevated at 2.6. Chest x-ray was unremarkable. Upon examination of her sacral decubitus was noted to be bone exposure with purulent, our smelling drainage. CT abdomen and pelvis revealed signs of infection involving the sacral decubitus including concern for possible necrotizing process. She is in. Was started on vancomycin and Zosyn in the ER and surgery was consulted.  She was taken to the operating room on the same date of admission but was unable to be extubated postoperatively so was subsequently admitted to the ICU under the care of pulmonary critical care medicine. Within 24 hour she was able to be extubated and after remaining stable from a respiratory standpoint per 24 hours she was deemed appropriate to transfer to the step down unit.  HPI/Subjective: Patient awake but nonverbal secondary to her underlying severe dementia.  Assessment/Plan:    Severe sepsis -Source infected decubitus ulcer -Sepsis physiology has resolved-repeat lactic acid normal on  01/22/2014 -Procalcitonin was equivocal at 1.12 -Did not require pressors     Acute respiratory failure with hypoxia -Require short-term mechanical ventilation postoperatively -No evidence of pulmonary infection and currently stable on room air    Decubitus ulcer of coccygeal region, stage 4 -Status post extensive excision and debridement by general surgery on 01/21/2014 -Patient has a very large postoperative wound and wound care would best be served in the LTAC setting -Wound cultures were not obtained in the operating room -Given the severity of dementia, underlying failure to thrive with severe protein calorie malnutrition it is doubtful this wound will ever completely healed    Anemia of critical illness and chronic disease -Baseline hemoglobin around 10.5 -Despite dehydration patient's hemoglobin 8.8 -Follow    HTN (hypertension) -BP has improved markedly and is now in the hypertensive range -7/10 we'll resume Catapres and if tolerates add back hydralazine in next 24-48 hours noting pt not alert enough to take pOs so will need topical or IV route for now    Dehydration with hypernatremia -Sodium still elevated but trending downwards -Currently on D5W at 50 cc per hour -Urine output adequate    Acute renal failure/CKD (chronic kidney disease), stage III -Baseline BUN/creatinine March 2015 was 13 and 0.86 -Serum creatinine peaked at 1.64 with a BUN of 45 this admission -Currently BUN 33 with creatinine 1.4 -Follow    Alzheimer's dementia/Immobility -Patient is DO NOT RESUSCITATE -Prognosis over the long term is guarded and as mentioned above it is doubtful that the wound will be able to heal completely    Severe protein-calorie malnutrition -As above -Would not recommend tube feeding or other artificial means of nutrition  DVT prophylaxis: Lovenox Code Status: DO NOT RESUSCITATE Family Communication: No family at bedside-case manager to notify daughter that discharged  to LTAC is a strong possibility Disposition Plan/Expected LOS: Stepdown  Consultants: General surgery PCCM  Procedures: Debridement stage IV sacral decubitus  01/21/2014  Antibiotics: Clindamycin 7/7  Vanc 7/7 >>  Zosyn 7/7 >>   Objective: Blood pressure 172/86, pulse 95, temperature 98.6 F (37 C), temperature source Axillary, resp. rate 14, height 4\' 11"  (1.499 m), weight 109 lb 9.1 oz (49.7 kg), SpO2 100.00%.  Intake/Output Summary (Last 24 hours) at 01/24/14 1236 Last data filed at 01/24/14 1200  Gross per 24 hour  Intake 1523.34 ml  Output    726 ml  Net 797.34 ml   Exam: General: No acute respiratory distress-briefly awakens Lungs: Clear to auscultation bilaterally without wheezes or crackles, RA Cardiovascular: Regular rate and rhythm without murmur gallop or rub normal S1 and S2, no peripheral edema or JVD Abdomen: Nontender, nondistended, soft, bowel sounds positive, no rebound, no ascites, no appreciable mass Integumentary: Large dressing over sacral decubitus Musculoskeletal: No significant cyanosis, clubbing of bilateral lower extremities   Scheduled Meds:  Scheduled Meds: . antiseptic oral rinse  15 mL Mouth Rinse QID  . chlorhexidine  15 mL Mouth Rinse BID  . enoxaparin (LOVENOX) injection  30 mg Subcutaneous Daily  . piperacillin-tazobactam (ZOSYN)  IV  2.25 g Intravenous Q6H  . sodium chloride  3 mL Intravenous Q12H  . vancomycin  500 mg Intravenous Q24H   Data Reviewed: Basic Metabolic Panel:  Recent Labs Lab 01/21/14 1645 01/21/14 2000 01/22/14 0130 01/23/14 0230  NA 157*  --  155* 151*  K 4.3  --  3.8 4.3  CL 117*  --  120* 117*  CO2 25  --  24 21  GLUCOSE 170*  --  166* 91  BUN 45*  --  42* 33*  CREATININE 1.64* 1.56* 1.48* 1.40*  CALCIUM 9.0  --  8.1* 8.5   Liver Function Tests:  Recent Labs Lab 01/21/14 1645 01/22/14 0130 01/23/14 0230  AST 101* 152* 211*  ALT 130* 126* 160*  ALKPHOS 129* 148* 165*  BILITOT 0.5 0.5 0.5    PROT 6.7 5.8* 5.9*  ALBUMIN 2.2* 2.0* 1.9*   CBC:  Recent Labs Lab 01/21/14 1645 01/21/14 2000 01/22/14 0130 01/23/14 0230  WBC 18.7* 15.0* 14.6* 17.6*  NEUTROABS 15.7*  --  12.7*  --   HGB 11.1* 10.1* 9.3* 8.8*  HCT 34.5* 31.7* 29.1* 27.1*  MCV 82.1 81.9 82.0 79.7  PLT 295 234 218 223   CBG:  Recent Labs Lab 01/22/14 1433 01/22/14 1505 01/22/14 1858 01/22/14 2329 01/23/14 0613  GLUCAP 68* 111* 80 75 116*    Recent Results (from the past 240 hour(s))  CULTURE, BLOOD (ROUTINE X 2)     Status: None   Collection Time    01/21/14  3:33 PM      Result Value Ref Range Status   Specimen Description BLOOD LEFT FOREARM   Final   Special Requests BOTTLES DRAWN AEROBIC ONLY 5CC   Final   Culture  Setup Time     Final   Value: 01/21/2014 22:23     Performed at [facility]   Culture     Final   Value:        BLOOD CULTURE RECEIVED NO GROWTH TO DATE CULTURE WILL BE HELD FOR 5 DAYS BEFORE ISSUING A FINAL NEGATIVE REPORT     Performed at [facility]  Report Status PENDING   Incomplete  CULTURE, BLOOD (ROUTINE X 2)     Status: None   Collection Time    01/21/14  4:45 PM      Result Value Ref Range Status   Specimen Description BLOOD HAND RIGHT   Final   Special Requests BOTTLES DRAWN AEROBIC AND ANAEROBIC [facility]6CC   Final   Culture  Setup Time     Final   Value: 01/21/2014 22:23     [facility]   Culture     Final   Value:        BLOOD CULTURE RECEIVED NO GROWTH TO DATE CULTURE WILL BE HELD FOR 5 DAYS BEFORE ISSUING A FINAL NEGATIVE REPORT     [facility]   Report Status PENDING   Incomplete  URINE CULTURE     Status: None   Collection Time    01/21/14  5:14 PM      Result Value Ref Range Status   Specimen Description URINE, CATHETERIZED   Final   Special Requests NONE   Final   Culture  Setup Time     Final   Value: 01/21/2014 18:55     [facility]   Colony Count     Final   Value: NO  GROWTH     [facility]   Culture     Final   Value: NO GROWTH     [facility] at [facility]   Report Status 01/22/2014 FINAL   Final  MRSA PCR SCREENING     Status: None   Collection Time    01/21/14 10:18 PM      Result Value Ref Range Status   MRSA by PCR NEGATIVE  NEGATIVE Final   Comment:            The GeneXpert MRSA Assay (FDA     approved for NASAL specimens     only), is one component of a     comprehensive MRSA colonization     surveillance program. It is not     intended to diagnose MRSA     infection nor to guide or     monitor treatment for     MRSA infections.     Studies:  Recent x-ray studies have been reviewed in detail by the Attending Physician  Time spent :  35 mins      Junious Silkllison Ellis, ANP [facility] Office  [phone] Pager [phone]   **If unable to reach the above provider after paging please contact the Flow Manager @ [phone]  On-Call/Text Page:      Loretha [email or web address]      password [email or web address]  If 7PM-7AM, please contact night-coverage [email or web address] Password [email or web address] 01/24/2014, 12:36 PM   LOS: 3 days   I have personally examined this patient and reviewed the entire database. I have reviewed the above note, made any necessary editorial changes, and agree with its content.  Lonia BloodJeffrey T. McClung, MD [facility]

## 2014-01-24 NOTE — Progress Notes (Signed)
CSW following for discharge planning needs and coordinating with RNCM: SNF vs. LTACH.   Maryclare LabradorJulie Rockell Faulks, MSW, Le[facility] HospitalCSWA Clinical Social Worker [phone]

## 2014-01-24 NOTE — Care Management Note (Addendum)
    Page 1 of 1   01/27/2014     9:57:22 AM CARE MANAGEMENT NOTE 01/27/2014  Patient:  Michelle Hooper,Michelle Hooper   Account Number:  [id number]  Date Initiated:  01/22/2014  Documentation initiated by:  [facility]ARAH  Subjective/Objective Assessment:   Admitted with Sepsis - surgery for sacral decub ulcer.  Now in ICU post surgery on vent.     Action/Plan:   Anticipated DC Date:  01/29/2014   Anticipated DC Plan:  LONG TERM ACUTE CARE (LTAC)      DC Planning Services  CM consult      PAC Choice  LONG TERM ACUTE CARE   Choice offered to / List presented to:             Status of service:  Completed, signed off Medicare Important Message given?  YES (If response is "NO", the following Medicare IM given date fields will be blank) Date Medicare IM given:  01/24/2014 Medicare IM given by:  Junius CreamerWELL,DEBBIE Date Additional Medicare IM given:  01/27/2014 Additional Medicare IM given by:  [facility] Cailean Heacock  Discharge Disposition:    Per UR Regulation:  Reviewed for med. necessity/level of care/duration of stay  If discussed at Long Length of Stay Meetings, dates discussed:    Comments:  Contact:  Hall,[name] Daughter     [phone]  7/10 0946 debbie Alexyss Balzarini rn,bsn dr Sharon Sellermcclung req ltac eval. [facility] checked and feels will be [facility]g w them. wait to hear back from [facility] then will speak w fam.

## 2014-01-25 LAB — BASIC METABOLIC PANEL
ANION GAP: 14 (ref 5–15)
BUN: 19 mg/dL (ref 6–23)
CALCIUM: 8.6 mg/dL (ref 8.4–10.5)
CO2: 22 mEq/L (ref 19–32)
CREATININE: 1.09 mg/dL (ref 0.50–1.10)
Chloride: 111 mEq/L (ref 96–112)
GFR calc Af Amer: 51 mL/min — ABNORMAL LOW (ref >90)
GFR, EST NON AFRICAN AMERICAN: 44 mL/min — AB (ref >90)
Glucose, Bld: 94 mg/dL (ref 70–99)
Potassium: 3.5 mEq/L — ABNORMAL LOW (ref 3.7–5.3)
Sodium: 147 mEq/L (ref 137–147)

## 2014-01-25 LAB — CBC
HCT: 26 % — ABNORMAL LOW (ref 36.0–46.0)
Hemoglobin: 8.7 g/dL — ABNORMAL LOW (ref 12.0–15.0)
MCH: 25.6 pg — ABNORMAL LOW (ref 26.0–34.0)
MCHC: 33.5 g/dL (ref 30.0–36.0)
MCV: 76.5 fL — AB (ref 78.0–100.0)
Platelets: 308 10*3/uL (ref 150–400)
RBC: 3.4 MIL/uL — ABNORMAL LOW (ref 3.87–5.11)
RDW: 13.6 % (ref 11.5–15.5)
WBC: 14.1 10*3/uL — ABNORMAL HIGH (ref 4.0–10.5)

## 2014-01-25 LAB — VANCOMYCIN, TROUGH: Vancomycin Tr: 11.9 ug/mL (ref 10.0–20.0)

## 2014-01-25 MED ORDER — HYDRALAZINE HCL 50 MG PO TABS
50.0000 mg | ORAL_TABLET | Freq: Three times a day (TID) | ORAL | Status: DC
  Administered 2014-01-26 – 2014-01-27 (×3): 50 mg via ORAL
  Filled 2014-01-25 (×8): qty 1

## 2014-01-25 MED ORDER — VANCOMYCIN HCL IN DEXTROSE 750-5 MG/150ML-% IV SOLN
750.0000 mg | INTRAVENOUS | Status: DC
  Administered 2014-01-26: 750 mg via INTRAVENOUS
  Filled 2014-01-25 (×2): qty 150

## 2014-01-25 MED ORDER — AMLODIPINE BESYLATE 10 MG PO TABS
10.0000 mg | ORAL_TABLET | Freq: Every day | ORAL | Status: DC
  Administered 2014-01-26 – 2014-01-27 (×2): 10 mg via ORAL
  Filled 2014-01-25 (×3): qty 1

## 2014-01-25 MED ORDER — POTASSIUM CHLORIDE 10 MEQ/100ML IV SOLN
10.0000 meq | INTRAVENOUS | Status: DC
  Filled 2014-01-25: qty 100

## 2014-01-25 MED ORDER — POTASSIUM CHLORIDE 10 MEQ/100ML IV SOLN
10.0000 meq | INTRAVENOUS | Status: AC
  Administered 2014-01-25 – 2014-01-26 (×3): 10 meq via INTRAVENOUS
  Filled 2014-01-25 (×2): qty 100

## 2014-01-25 NOTE — Progress Notes (Signed)
4 Days Post-Op  Subjective: Awake, baseline dementia  Objective: Vital signs in last 24 hours: Temp:  [98.5 F (36.9 C)-99.6 F (37.6 C)] 99.5 F (37.5 C) (07/11 0337) Pulse Rate:  [82-101] 84 (07/11 0337) Resp:  [14-32] 26 (07/11 0337) BP: (130-172)/(61-88) 163/74 mmHg (07/11 0337) SpO2:  [98 %-100 %] 100 % (07/11 0337) Last BM Date:  (unknown-PTA)  Intake/Output from previous day: 07/10 0701 - 07/11 0700 In: 1466.8 [I.V.:841.8; IV Piggyback:400] Out: 250 [Urine:250] Intake/Output this shift:   Large sacral wound fairly clean, non purulence  Lab Results:   Recent Labs  01/23/14 0230 01/25/14 0238  WBC 17.6* 14.1*  HGB 8.8* 8.7*  HCT 27.1* 26.0*  PLT 223 308   BMET  Recent Labs  01/23/14 0230 01/25/14 0238  NA 151* 147  K 4.3 3.5*  CL 117* 111  CO2 21 22  GLUCOSE 91 94  BUN 33* 19  CREATININE 1.40* 1.09  CALCIUM 8.5 8.6   PT/INR No results found for this basename: LABPROT, INR,  in the last 72 hours ABG No results found for this basename: PHART, PCO2, PO2, HCO3,  in the last 72 hours  Studies/Results: No results found.  Anti-infectives: Anti-infectives   Start     Dose/Rate Route Frequency Ordered Stop   01/23/14 1600  vancomycin (VANCOCIN) IVPB 1000 mg/200 mL premix  Status:  Discontinued     1,000 mg 200 mL/hr over 60 Minutes Intravenous Every 48 hours 01/21/14 1738 01/23/14 0723   01/23/14 0800  vancomycin (VANCOCIN) 500 mg in sodium chloride 0.9 % 100 mL IVPB     500 mg 100 mL/hr over 60 Minutes Intravenous Every 24 hours 01/23/14 0723     01/22/14 0015  clindamycin (CLEOCIN) IVPB 600 mg  Status:  Discontinued     600 mg 100 mL/hr over 30 Minutes Intravenous 3 times per day 01/22/14 0009 01/22/14 0955   01/21/14 2130  piperacillin-tazobactam (ZOSYN) IVPB 2.25 g     2.25 g 100 mL/hr over 30 Minutes Intravenous Every 6 hours 01/21/14 1738     01/21/14 1500  piperacillin-tazobactam (ZOSYN) IVPB 3.375 g     3.375 g 100 mL/hr over 30 Minutes  Intravenous  Once 01/21/14 1459 01/21/14 1543   01/21/14 1500  vancomycin (VANCOCIN) IVPB 1000 mg/200 mL premix     1,000 mg 200 mL/hr over 60 Minutes Intravenous  Once 01/21/14 1459 01/21/14 1650      Assessment/Plan: s/p Procedure(s): IRRIGATION AND DEBRIDEMENT Sacral Decubitus Ulcer (N/A)  Continue local wound care. Will see again Monday   LOS: 4 days    Duvan Mousel A 01/25/2014

## 2014-01-25 NOTE — Progress Notes (Signed)
[name]  Joeleen Wortley ZOX:[id number] DOB: Sep 16, 1924 DOA: 01/21/2014 PCP: Jackie Plum, MD  Brief narrative: 80 year old female patient with known end-stage dementia and hypertension as well as large sacral decubitus. She presented to the emergency department with reports of decreased appetite and increasing agitation over 48 hours prior to presentation. On the date of admission patient had refused to eat. EMS was called.  Upon arrival to the emergency department patient was noted to be febrile with a temperature of 103.9. Her white count was elevated at 18,700, hemoglobin 11.1, sodium 137, creatinine 1.64 with a BUN of 45. Her lactate was also elevated at 2.6. Chest x-ray was unremarkable. Upon examination of her sacral decubitus bone exposure with purulent, our smelling drainage was noted. CT abdomen and pelvis revealed concern for a possible necrotizing process.   She was taken to the operating room on the date of admission but was unable to be extubated postoperatively so was subsequently admitted to the ICU under the care of pulmonary critical care medicine. Within 24 hour she was able to be extubated and after remaining stable from a respiratory standpoint per 24 hours she was deemed appropriate to transfer to the step down unit.  HPI/Subjective: Patient awake but nonverbal secondary to her underlying severe dementia.  No change in mental status today.    Assessment/Plan:  Severe sepsis -Source infected decubitus ulcer -Sepsis physiology has resolved - repeat lactic acid normal on 01/22/2014 -Did not require pressors   Acute respiratory failure with hypoxia -Required short-term mechanical ventilation postoperatively -No evidence of pulmonary infection and currently stable on room air  Decubitus ulcer of coccygeal region, stage 4 -Status post extensive excision and debridement by general surgery on 01/21/2014 -Patient has a very large  postoperative wound and wound care would best be served in the LTAC setting -Wound cultures were not obtained in the operating room -Given the severity of dementia, underlying failure to thrive with severe protein calorie malnutrition it is doubtful this wound will ever completely healed  Anemia of critical illness and chronic disease -Baseline hemoglobin around 10.5 -Despite dehydration patient's hemoglobin 8.8 -Follow  HTN  -BP has improved markedly and is now in the hypertensive range -resume usual home meds and follow trend   Dehydration with hypernatremia -resolved w/ free water via IV   Acute renal failure / CKD stage III -Baseline BUN/creatinine March 2015 was 13 and 0.86 -Serum creatinine peaked at 1.64 with a BUN of 45 this admission -crt steadily approaching baseline with volume expansion   Alzheimer's dementia -Patient is DO NOT RESUSCITATE -Prognosis over the long term is guarded and as mentioned above it is doubtful that the wound will be able to heal completely  Severe protein-calorie malnutrition -As above -Would not recommend tube feeding or other artificial means of nutrition   DVT prophylaxis: Lovenox Code Status: DO NOT RESUSCITATE Family Communication: No family at bedside Disposition Plan/Expected LOS: Stepdown  Consultants: General surgery PCCM  Procedures: Debridement stage IV sacral decubitus  01/21/2014  Antibiotics: Clindamycin 7/7  Vanc 7/7 >>  Zosyn 7/7 >>   Objective: Blood pressure 154/62, pulse 78, temperature 98.3 F (36.8 C), temperature source Axillary, resp. rate 19, height 4\' 11"  (1.499 m), weight 49.7 kg (109 lb 9.1 oz), SpO2 99.00%.  Intake/Output Summary (Last 24 hours) at 01/25/14 1748 Last data filed at 01/25/14 1634  Gross per 24 hour  Intake   1178 ml  Output    875 ml  Net  303 ml   Exam: General: No acute respiratory distress Lungs: Clear to auscultation bilaterally without wheezes or crackles Cardiovascular:  Regular rate and rhythm without murmur gallop or rub normal S1 and S2, no peripheral edema  Abdomen: Nontender, nondistended, soft, bowel sounds positive, no rebound, no ascites, no appreciable mass Integumentary: Large dressing over sacral decubitus Musculoskeletal: No significant cyanosis, clubbing of bilateral lower extremities   Scheduled Meds:  Scheduled Meds: . antiseptic oral rinse  15 mL Mouth Rinse QID  . chlorhexidine  15 mL Mouth Rinse BID  . cloNIDine  0.2 mg Transdermal Weekly  . enoxaparin (LOVENOX) injection  30 mg Subcutaneous Daily  . piperacillin-tazobactam (ZOSYN)  IV  2.25 g Intravenous Q6H  . sodium chloride  3 mL Intravenous Q12H  . [START ON 01/26/2014] vancomycin  750 mg Intravenous Q24H   Data Reviewed: Basic Metabolic Panel:  Recent Labs Lab 01/21/14 1645 01/21/14 2000 01/22/14 0130 01/23/14 0230 01/25/14 0238  NA 157*  --  155* 151* 147  K 4.3  --  3.8 4.3 3.5*  CL 117*  --  120* 117* 111  CO2 25  --  24 21 22   GLUCOSE 170*  --  166* 91 94  BUN 45*  --  42* 33* 19  CREATININE 1.64* 1.56* 1.48* 1.40* 1.09  CALCIUM 9.0  --  8.1* 8.5 8.6   Liver Function Tests:  Recent Labs Lab 01/21/14 1645 01/22/14 0130 01/23/14 0230  AST 101* 152* 211*  ALT 130* 126* 160*  ALKPHOS 129* 148* 165*  BILITOT 0.5 0.5 0.5  PROT 6.7 5.8* 5.9*  ALBUMIN 2.2* 2.0* 1.9*   CBC:  Recent Labs Lab 01/21/14 1645 01/21/14 2000 01/22/14 0130 01/23/14 0230 01/25/14 0238  WBC 18.7* 15.0* 14.6* 17.6* 14.1*  NEUTROABS 15.7*  --  12.7*  --   --   HGB 11.1* 10.1* 9.3* 8.8* 8.7*  HCT 34.5* 31.7* 29.1* 27.1* 26.0*  MCV 82.1 81.9 82.0 79.7 76.5*  PLT 295 234 218 223 308   CBG:  Recent Labs Lab 01/22/14 1433 01/22/14 1505 01/22/14 1858 01/22/14 2329 01/23/14 0613  GLUCAP 68* 111* 80 75 116*    Recent Results (from the past 240 hour(s))  CULTURE, BLOOD (ROUTINE X 2)     Status: None   Collection Time    01/21/14  3:33 PM      Result Value Ref Range Status     Specimen Description BLOOD LEFT FOREARM   Final   Special Requests BOTTLES DRAWN AEROBIC ONLY 5CC   Final   Culture  Setup Time     Final   Value: 01/21/2014 22:23     [facility]formed [facility]   Culture     Final   Value:        BLOOD CULTURE RECEIVED NO GROWTH TO DATE CULTURE WILL BE HELD FOR 5 DAYS BEFORE ISSUING A FINAL NEGATIVE REPORT     [facility]formed at [facility]   Report Status PENDING   Incomplete  CULTURE, BLOOD (ROUTINE X 2)     Status: None   Collection Time    01/21/14  4:45 PM      Result Value Ref Range Status   Specimen Description BLOOD HAND RIGHT   Final   Special Requests BOTTLES DRAWN AEROBIC AND ANAEROBIC [facility]CC   Final   Culture  Setup Time     Final   Value: 01/21/2014 22:23     [facility]   Culture  Final   Value:        BLOOD CULTURE RECEIVED NO GROWTH TO DATE CULTURE WILL BE HELD FOR 5 DAYS BEFORE ISSUING A FINAL NEGATIVE REPORT     Performed at [facility]   Report Status PENDING   Incomplete  URINE CULTURE     Status: None   Collection Time    01/21/14  5:14 PM      Result Value Ref Range Status   Specimen Description URINE, CATHETERIZED   Final   Special Requests NONE   Final   Culture  Setup Time     Final   Value: 01/21/2014 18:55     Performed at [facility] Count     Final   Value: NO GROWTH     Performed at [facility]   Culture     Final   Value: NO GROWTH     Performed at [facility]   Report Status 01/22/2014 FINAL   Final  MRSA PCR SCREENING     Status: None   Collection Time    01/21/14 10:18 PM      Result Value Ref Range Status   MRSA by PCR NEGATIVE  NEGATIVE Final   Comment:            The GeneXpert MRSA Assay (FDA     approved for NASAL specimens     only), is one component of a     comprehensive MRSA colonization     surveillance program. It is not     intended to diagnose MRSA     infection nor to guide or     monitor treatment for      MRSA infections.     Studies:  Recent x-ray studies have been reviewed in detail by the Attending Physician  Time spent :  35 mins  Lonia Blood, MD [facility] For Consults/Admissions - Flow Manager - [phone] Office  [phone] Pager [phone]  On-Call/Text Page:      [email or web address]      password [facility]  01/25/2014, 5:48 PM   LOS: 4 days

## 2014-01-25 NOTE — Progress Notes (Signed)
Nurse unable to start K as ordered due to loss of IV access.  IV team paged to gain new site

## 2014-01-25 NOTE — Progress Notes (Signed)
ANTIBIOTIC CONSULT NOTE - FOLLOW UP  Pharmacy Consult for Vancomycin and Zosyn Indication: sepsis coverage  No Known Allergies  Patient Measurements: Height: 4\' 11"  (149.9 cm) Weight: 109 lb 9.1 oz (49.7 kg) IBW/kg (Calculated) : 43.2  Vital Signs: Temp: 98.7 F (37.1 C) (07/11 1148) Temp src: Oral (07/11 1148) BP: 160/72 mmHg (07/11 1200) Pulse Rate: 81 (07/11 1200) Intake/Output from previous day: 07/10 0701 - 07/11 0700 In: 1716.8 [I.V.:1041.8; IV Piggyback:450] Out: 450 [Urine:450] Intake/Output from this shift: Total I/O In: 103 [I.V.:103] Out: 150 [Urine:150]  Labs:  Recent Labs  01/23/14 0230 01/25/14 0238  WBC 17.6* 14.1*  HGB 8.8* 8.7*  PLT 223 308  CREATININE 1.40* 1.09   Estimated Creatinine Clearance: 24.8 ml/min (by C-G formula based on Cr of 1.09).  Recent Labs  01/25/14 0833  VANCOTROUGH 11.9    Assessment:   Day # 5 Vancomycin and Zosyn.  Vancomycin initially dose with 1 gram IV q48hrs, but changed to 500 mg q24hrs on 7/9 with improved renal function.  Trough level this morning is 11.9 mg/ml.  Reasonable for cellulitis coverage, but we are targeting troughs of 15-20 mcg/ml for sepsis coverage. Creatinine has trended down to 1.09, WBC down to 14.1, Tmax 99.6.  POD# 4 I&D of sacral decubitus. No wound cultures done.  Goal of Therapy:  Vancomycin trough level 15-20 mcg/ml Appropriate Zosyn dose for renal function  Plan:   Increase Vancomycin to 750 mg IV q24hrs with next dose, in am.   Continue Zosyn 2.25 gm IV q6hrs.  Will follow renal function, progress.  Will plan to re-check Vancomycin trough after 3-4 doses of new regimen.  Dennie Fettersgan, Darriel Utter [place], [place] Pager: [phone] 01/25/2014,1:06 PM

## 2014-01-26 LAB — CBC
HEMATOCRIT: 25.2 % — AB (ref 36.0–46.0)
Hemoglobin: 8.5 g/dL — ABNORMAL LOW (ref 12.0–15.0)
MCH: 26.1 pg (ref 26.0–34.0)
MCHC: 33.7 g/dL (ref 30.0–36.0)
MCV: 77.3 fL — ABNORMAL LOW (ref 78.0–100.0)
PLATELETS: 311 10*3/uL (ref 150–400)
RBC: 3.26 MIL/uL — ABNORMAL LOW (ref 3.87–5.11)
RDW: 13.8 % (ref 11.5–15.5)
WBC: 12.9 10*3/uL — AB (ref 4.0–10.5)

## 2014-01-26 LAB — COMPREHENSIVE METABOLIC PANEL
ALBUMIN: 1.8 g/dL — AB (ref 3.5–5.2)
ALT: 61 U/L — ABNORMAL HIGH (ref 0–35)
ANION GAP: 14 (ref 5–15)
AST: 47 U/L — ABNORMAL HIGH (ref 0–37)
Alkaline Phosphatase: 127 U/L — ABNORMAL HIGH (ref 39–117)
BUN: 14 mg/dL (ref 6–23)
CALCIUM: 8.5 mg/dL (ref 8.4–10.5)
CHLORIDE: 109 meq/L (ref 96–112)
CO2: 21 mEq/L (ref 19–32)
CREATININE: 0.94 mg/dL (ref 0.50–1.10)
GFR calc Af Amer: 61 mL/min — ABNORMAL LOW (ref >90)
GFR calc non Af Amer: 53 mL/min — ABNORMAL LOW (ref >90)
Glucose, Bld: 83 mg/dL (ref 70–99)
Potassium: 4.1 mEq/L (ref 3.7–5.3)
Sodium: 144 mEq/L (ref 137–147)
Total Bilirubin: 0.7 mg/dL (ref 0.3–1.2)
Total Protein: 5.7 g/dL — ABNORMAL LOW (ref 6.0–8.3)

## 2014-01-26 MED ORDER — HYDRALAZINE HCL 20 MG/ML IJ SOLN
10.0000 mg | Freq: Once | INTRAMUSCULAR | Status: AC
  Administered 2014-01-26: 10 mg via INTRAVENOUS
  Filled 2014-01-26: qty 1

## 2014-01-26 MED ORDER — CLONIDINE HCL 0.1 MG PO TABS
0.1000 mg | ORAL_TABLET | Freq: Three times a day (TID) | ORAL | Status: DC
  Administered 2014-01-26 – 2014-01-27 (×4): 0.1 mg via ORAL
  Filled 2014-01-26 (×5): qty 1

## 2014-01-26 NOTE — Progress Notes (Addendum)
[name]  Michelle Hooper RUE:[id number] DOB: July 25, 1924 DOA: 01/21/2014 PCP: Jackie Plum, MD  Brief narrative: 80 year old female patient with known end-stage dementia and hypertension as well as large sacral decubitus. She presented to the emergency department with reports of decreased appetite and increasing agitation over 48 hours prior to presentation. On the date of admission patient had refused to eat. EMS was called.  Upon arrival to the emergency department patient was noted to be febrile with a temperature of 103.9. Her white count was elevated at 18,700, hemoglobin 11.1, sodium 137, creatinine 1.64 with a BUN of 45. Her lactate was also elevated at 2.6. Chest x-ray was unremarkable. Upon examination of her sacral decubitus bone exposure with purulent, our smelling drainage was noted. CT abdomen and pelvis revealed concern for a possible necrotizing process.   She was taken to the operating room on the date of admission but was unable to be extubated postoperatively so was subsequently admitted to the ICU under the care of pulmonary critical care medicine. Within 24 hour she was able to be extubated and after remaining stable from a respiratory standpoint per 24 hours she was deemed appropriate to transfer to the step down unit.  HPI/Subjective: Suffered an episode of poor responsiveness last night.  Remains quite sedate today, but given her severe dementia this is not far from her baseline.    Assessment/Plan:  Severe sepsis -Source infected decubitus ulcer -Sepsis physiology has resolved - repeat lactic acid normal on 01/22/2014 -Did not require pressors   Acute respiratory failure with hypoxia -Required short-term mechanical ventilation postoperatively -No evidence of pulmonary infection and currently stable on room air  Decubitus ulcer of coccygeal region, stage 4 -Status post extensive excision and debridement by general surgery  on 01/21/2014 -Patient has a very large postoperative wound and wound care would best be served in the LTAC setting -Given the severity of dementia, underlying failure to thrive with severe protein calorie malnutrition it is doubtful this wound will ever completely healed  Anemia of critical illness and chronic disease -Baseline hemoglobin around 10.5 -stable - follow  HTN  -BP has improved markedly and is now in the hypertensive range -resumed usual home meds - follow trend w/o change today   Dehydration with hypernatremia -resolved w/ free water via IV   Acute renal failure / CKD stage III -Baseline BUN/creatinine March 2015 was 13 and 0.86 -Serum creatinine peaked at 1.64 with a BUN of 45 this admission -crt now at baseline w/ GFR 60   Alzheimer's dementia -Patient is DO NOT RESUSCITATE -Prognosis over the long term is guarded and as mentioned above it is doubtful that the wound will be able to heal completely  Severe protein-calorie malnutrition -As above -Would not recommend tube feeding or other artificial means of nutrition   DVT prophylaxis: Lovenox Code Status: DO NOT RESUSCITATE Family Communication: No family at bedside Disposition Plan/Expected LOS: Stepdown  Consultants: General surgery PCCM  Procedures: Debridement stage IV sacral decubitus  01/21/2014  Antibiotics: Clindamycin 7/7  Vanc 7/7 >>  Zosyn 7/7 >>   Objective: Blood pressure 148/80, pulse 97, temperature 97.5 F (36.4 C), temperature source Oral, resp. rate 22, height 4\' 11"  (1.499 m), weight 49.7 kg (109 lb 9.1 oz), SpO2 100.00%.  Intake/Output Summary (Last 24 hours) at 01/26/14 1440 Last data filed at 01/26/14 1300  Gross per 24 hour  Intake 1058.5 ml  Output   1050 ml  Net    8.5  ml   Exam: General: No acute respiratory distress - sedate  Lungs: Clear to auscultation bilaterally without wheezes or crackles Cardiovascular: Regular rate and rhythm without murmur gallop or rub, no  peripheral edema  Abdomen: Nontender, nondistended, soft, bowel sounds positive, no rebound, no ascites, no appreciable mass Integumentary: Large dressing over sacral decubitus Musculoskeletal: No significant cyanosis, clubbing of bilateral lower extremities   Scheduled Meds:  Scheduled Meds: . amLODipine  10 mg Oral Daily  . antiseptic oral rinse  15 mL Mouth Rinse QID  . chlorhexidine  15 mL Mouth Rinse BID  . cloNIDine  0.2 mg Transdermal Weekly  . enoxaparin (LOVENOX) injection  30 mg Subcutaneous Daily  . hydrALAZINE  50 mg Oral 3 times per day  . piperacillin-tazobactam (ZOSYN)  IV  2.25 g Intravenous Q6H  . sodium chloride  3 mL Intravenous Q12H  . vancomycin  750 mg Intravenous Q24H   Data Reviewed: Basic Metabolic Panel:  Recent Labs Lab 01/21/14 1645 01/21/14 2000 01/22/14 0130 01/23/14 0230 01/25/14 0238 01/26/14 0350  NA 157*  --  155* 151* 147 144  K 4.3  --  3.8 4.3 3.5* 4.1  CL 117*  --  120* 117* 111 109  CO2 25  --  24 21 22 21   GLUCOSE 170*  --  166* 91 94 83  BUN 45*  --  42* 33* 19 14  CREATININE 1.64* 1.56* 1.48* 1.40* 1.09 0.94  CALCIUM 9.0  --  8.1* 8.5 8.6 8.5   Liver Function Tests:  Recent Labs Lab 01/21/14 1645 01/22/14 0130 01/23/14 0230 01/26/14 0350  AST 101* 152* 211* 47*  ALT 130* 126* 160* 61*  ALKPHOS 129* 148* 165* 127*  BILITOT 0.5 0.5 0.5 0.7  PROT 6.7 5.8* 5.9* 5.7*  ALBUMIN 2.2* 2.0* 1.9* 1.8*   CBC:  Recent Labs Lab 01/21/14 1645 01/21/14 2000 01/22/14 0130 01/23/14 0230 01/25/14 0238 01/26/14 0350  WBC 18.7* 15.0* 14.6* 17.6* 14.1* 12.9*  NEUTROABS 15.7*  --  12.7*  --   --   --   HGB 11.1* 10.1* 9.3* 8.8* 8.7* 8.5*  HCT 34.5* 31.7* 29.1* 27.1* 26.0* 25.2*  MCV 82.1 81.9 82.0 79.7 76.5* 77.3*  PLT 295 234 218 223 308 311   CBG:  Recent Labs Lab 01/22/14 1433 01/22/14 1505 01/22/14 1858 01/22/14 2329 01/23/14 0613  GLUCAP 68* 111* 80 75 116*    Recent Results (from the past 240 hour(s))    CULTURE, BLOOD (ROUTINE X 2)     Status: None   Collection Time    01/21/14  3:33 PM      Result Value Ref Range Status   Specimen Description BLOOD LEFT FOREARM   Final   Special Requests BOTTLES DRAWN AEROBIC ONLY 5CC   Final   Culture  Setup Time     Final   Value: 01/21/2014 22:23     Performed at [facility]   Culture     Final   Value:        BLOOD CULTURE RECEIVED NO GROWTH TO DATE CULTURE WILL BE HELD FOR 5 DAYS BEFORE ISSUING A FINAL NEGATIVE REPORT     Performed at [facility]   Report Status PENDING   Incomplete  CULTURE, BLOOD (ROUTINE X 2)     Status: None   Collection Time    01/21/14  4:45 PM      Result Value Ref Range Status   Specimen Description BLOOD HAND RIGHT   Final  Special Requests BOTTLES DRAWN AEROBIC AND ANAEROBIC [facility]   Final   Culture  Setup Time     Final   Value: 01/21/2014 22:23     Performed at [facility]   Culture     Final   Value:        BLOOD CULTURE RECEIVED NO GROWTH TO DATE CULTURE WILL BE HELD FOR 5 DAYS BEFORE ISSUING A FINAL NEGATIVE REPORT     Performed at [facility]   Report Status PENDING   Incomplete  URINE CULTURE     Status: None   Collection Time    01/21/14  5:14 PM      Result Value Ref Range Status   Specimen Description URINE, CATHETERIZED   Final   Special Requests NONE   Final   Culture  Setup Time     Final   Value: 01/21/2014 18:55     Performed at [facility] Count     Final   Value: NO GROWTH     Performed at [facility]   Culture     Final   Value: NO GROWTH     Performed at [facility]   Report Status 01/22/2014 FINAL   Final  MRSA PCR SCREENING     Status: None   Collection Time    01/21/14 10:18 PM      Result Value Ref Range Status   MRSA by PCR NEGATIVE  NEGATIVE Final   Comment:            The GeneXpert MRSA Assay (FDA     approved for NASAL specimens     only), is one component of a     comprehensive MRSA colonization      surveillance program. It is not     intended to diagnose MRSA     infection nor to guide or     monitor treatment for     MRSA infections.     Studies:  Recent x-ray studies have been reviewed in detail by the Attending Physician  Time spent :  35 mins  Lonia Blood, MD [facility] For Consults/Admissions - Flow Manager - [phone] Office  [phone] Pager [phone]  On-Call/Text Page:      [email or web address]      password [facility]  01/26/2014, 2:40 PM   LOS: 5 days

## 2014-01-27 ENCOUNTER — Inpatient Hospital Stay
Admission: AD | Admit: 2014-01-27 | Discharge: 2014-02-19 | Disposition: A | Discharge status: Skilled Nursing Facility | Payer: Self-pay | Source: Ambulatory Visit | Attending: Internal Medicine | Admitting: Internal Medicine

## 2014-01-27 DIAGNOSIS — E43 Unspecified severe protein-calorie malnutrition: Secondary | ICD-10-CM

## 2014-01-27 LAB — CULTURE, BLOOD (ROUTINE X 2)
CULTURE: NO GROWTH
Culture: NO GROWTH

## 2014-01-27 LAB — BASIC METABOLIC PANEL
ANION GAP: 17 — AB (ref 5–15)
BUN: 12 mg/dL (ref 6–23)
CHLORIDE: 106 meq/L (ref 96–112)
CO2: 20 mEq/L (ref 19–32)
Calcium: 8.7 mg/dL (ref 8.4–10.5)
Creatinine, Ser: 0.97 mg/dL (ref 0.50–1.10)
GFR calc non Af Amer: 51 mL/min — ABNORMAL LOW (ref >90)
GFR, EST AFRICAN AMERICAN: 59 mL/min — AB (ref >90)
Glucose, Bld: 114 mg/dL — ABNORMAL HIGH (ref 70–99)
POTASSIUM: 4 meq/L (ref 3.7–5.3)
Sodium: 143 mEq/L (ref 137–147)

## 2014-01-27 LAB — CBC
HCT: 28.1 % — ABNORMAL LOW (ref 36.0–46.0)
HEMOGLOBIN: 9.5 g/dL — AB (ref 12.0–15.0)
MCH: 25.7 pg — ABNORMAL LOW (ref 26.0–34.0)
MCHC: 33.8 g/dL (ref 30.0–36.0)
MCV: 76.2 fL — ABNORMAL LOW (ref 78.0–100.0)
Platelets: 404 10*3/uL — ABNORMAL HIGH (ref 150–400)
RBC: 3.69 MIL/uL — AB (ref 3.87–5.11)
RDW: 13.7 % (ref 11.5–15.5)
WBC: 14.9 10*3/uL — ABNORMAL HIGH (ref 4.0–10.5)

## 2014-01-27 MED ORDER — PIPERACILLIN-TAZOBACTAM 3.375 G IVPB: 3.3750 g | Freq: Three times a day (TID) | INTRAVENOUS | Status: DC

## 2014-01-27 MED ORDER — ENOXAPARIN SODIUM 30 MG/0.3ML ~~LOC~~ SOLN: 30.0000 mg | Freq: Every day | SUBCUTANEOUS | Status: DC

## 2014-01-27 MED ORDER — DEXTROSE 5 % IV SOLN: INTRAVENOUS | Status: DC

## 2014-01-27 MED ORDER — FENTANYL CITRATE 0.05 MG/ML IJ SOLN: 12.5000 ug | INTRAMUSCULAR | Status: DC | PRN

## 2014-01-27 MED ORDER — PIPERACILLIN-TAZOBACTAM 3.375 G IVPB
3.3750 g | Freq: Three times a day (TID) | INTRAVENOUS | Status: DC
  Administered 2014-01-27: 3.375 g via INTRAVENOUS
  Filled 2014-01-27 (×3): qty 50

## 2014-01-27 MED ORDER — BIOTENE DRY MOUTH MT LIQD: 15.0000 mL | Freq: Four times a day (QID) | OROMUCOSAL | Status: DC

## 2014-01-27 MED ORDER — SODIUM CHLORIDE 0.9 % IJ SOLN: 3.0000 mL | Freq: Two times a day (BID) | INTRAMUSCULAR | Status: DC

## 2014-01-27 MED ORDER — CHLORHEXIDINE GLUCONATE 0.12 % MT SOLN: 15.0000 mL | Freq: Two times a day (BID) | OROMUCOSAL | Status: DC

## 2014-01-27 MED ORDER — BISACODYL 10 MG RE SUPP: 10.0000 mg | Freq: Every day | RECTAL | Status: AC | PRN

## 2014-01-27 NOTE — Progress Notes (Signed)
CARE MANAGEMENT NOTE 01/27/2014  Patient:  Michelle Hooper,Michelle Hooper   Account Number:  [id number]  Date Initiated:  01/22/2014  Documentation initiated by:  [facility],Michelle  Subjective/Objective Assessment:   Admitted with Sepsis - surgery for sacral decub ulcer.  Now in ICU post surgery on vent.     Action/Plan:   Anticipated DC Date:  01/29/2014   Anticipated DC Plan:  LONG TERM ACUTE CARE (LTAC)      DC Planning Services  CM consult      PAC Choice  LONG TERM ACUTE CARE   Choice offered to / List presented to:             Status of service:  Completed, signed off Medicare Important Message given?  YES (If response is "NO", the following Medicare IM given date fields will be blank) Date Medicare IM given:  01/24/2014 Medicare IM given by:  Michelle CreamerWELL,Michelle Date Additional Medicare IM given:  01/27/2014 Additional Medicare IM given by:  [facility]  Discharge Disposition:  LONG TERM ACUTE CARE (LTAC)  Per UR Regulation:  Reviewed for med. necessity/level of care/duration of stay  If discussed at Long Length of Stay Meetings, dates discussed:    Comments:  01/27/2014 1640 NCM spoke to dtr, Te[employer] CorporationJosephine Hooper. States she will be here at 6 pm to sign paperwork for transfer to LTAC. Dtr states she may decide for pt to come home. Explained to dtr pt requires extensive care and would advise she discuss with physician when she arrives. Michelle DonningAlesia Haylee Mcanany RN CCM Case Mgmt phone [phone]  01/27/2014 1000 NCM attempted call to dtr to give info on transfer to [facility]. Left message. Michelle DonningAlesia Michaella Imai RN CCM Case Mgmt phone [phone]  Contact:  Hooper,Michelle Daughter     [phone]  7/10 0946 Michelle dowell rn,bsn dr Michelle Sellermcclung req ltac eval. [facility] checked and feels will be [facility]g w them. wait to hear back from [facility] then will speak w fam.

## 2014-01-27 NOTE — Progress Notes (Signed)
[facility]red [facility] bed.  Report given to Marquis Lunchesiree Taylor, RN.

## 2014-01-27 NOTE — Discharge Summary (Signed)
Physician Discharge Summary  Michelle Hooper OZH:[id number] DOB: 1924/11/04 DOA: 01/21/2014  PCP: Jackie Plum, MD  Admit date: 01/21/2014 Discharge date: 01/27/2014  Time spent: >30 minutes  Recommendations for Outpatient Follow-up:  1. Discharge to [facility] to continue complex wound care and IV anbx's  Discharge Diagnoses:    Severe sepsis-resolved   Acute respiratory failure with hypoxia-resolved   Decubitus ulcer of coccygeal region, stage 4 s/p extensive excision and debridement   Anemia of critical illness and chronic disease   HTN    Dehydration with hypernatremia-resolved   Acute renal failure on Chronic kidney disease, stage III   Alzheimer's dementia   Severe protein-calorie malnutrition   Immobility  Discharge Condition: stable  Diet recommendation: Pureed with nectar thick liquids (no dentition or dentures)  Filed Weights   01/21/14 1715 01/21/14 2230 01/23/14 0500  Weight: 123 lb 14.4 oz (56.2 kg) 107 lb 2.3 oz (48.6 kg) 109 lb 9.1 oz (49.7 kg)    History of present illness:  80 year old female patient with known end-stage dementia and hypertension as well as large sacral decubitus. She presented to the emergency department with reports of decreased appetite and increasing agitation over 48 hours prior to presentation. On the date of admission patient had refused to eat. EMS was called.   Upon arrival to the emergency department patient was noted to be febrile with a temperature of 103.9. Her white count was elevated at 18,700, hemoglobin 11.1, sodium 137, creatinine 1.64 with a BUN of 45. Her lactate was also elevated at 2.6. Chest x-ray was unremarkable. Upon examination of her sacral decubitus bone exposure with purulent, our smelling drainage was noted. CT abdomen and pelvis revealed concern for a possible necrotizing process.   She was taken to the operating room on the date of admission but was unable to be extubated postoperatively so was subsequently  admitted to the ICU under the care of pulmonary critical care medicine. Within 24 hour she was able to be extubated and after remaining stable from a respiratory standpoint per 24 hours she was deemed appropriate to transfer to the step down unit.   Hospital Course:   Severe sepsis  -Source infected decubitus ulcer  -Sepsis physiology has resolved - repeat lactic acid normal on 01/22/2014  -Did not require pressors   Acute respiratory failure with hypoxia  -Required short-term mechanical ventilation postoperatively  -No evidence of pulmonary infection and currently stable on room air   Decubitus ulcer of coccygeal region, stage 4  -Status post extensive excision and debridement by general surgery on 01/21/2014  -Patient has a very large postoperative wound and current complex wound care would best be served in the LTAC setting - [facility] 01/27/2014 -Given the severity of dementia, underlying failure to thrive with severe protein calorie malnutrition it is doubtful this wound will ever completely heal -Zosyn duration per general surgery  Anemia of critical illness and chronic disease  -Baseline hemoglobin around 10.5 and current is 9.5 -did not require transfusion this admission  HTN  -BP improved markedly into the hypertensive range therefore resumed usual home meds - current trends is stable  Dehydration with hypernatremia  -resolved w/ free water via IV  -cont D5 IVFs until oral intake improved   Acute renal failure / CKD stage III  -Baseline BUN/creatinine March 2015 was 13 and 0.86  -Serum creatinine peaked at 1.64 with a BUN of 45 this admission  -crt now at baseline w/ GFR 60   Alzheimer's dementia  -Patient  is DO NOT RESUSCITATE  -Prognosis over the long term is guarded and as mentioned above it is doubtful that the wound will be able to heal completely   Severe protein-calorie malnutrition  -As above   Procedures: Debridement stage IV sacral decubitus   01/21/2014  Consultations: General surgery  PCCM  Discharge Exam: Filed Vitals:   01/27/14 0917  BP: 142/66  Pulse:   Temp:   Resp:    General: No acute respiratory distress - awake but non verbal  Lungs: Clear to auscultation bilaterally without wheezes or crackles, RA Cardiovascular: Regular rate and rhythm without murmur gallop or rub, no peripheral edema  Abdomen: Nontender, nondistended, soft, bowel sounds positive, no rebound, no ascites, no appreciable mass  Integumentary: Large dressing over sacral decubitus  Musculoskeletal: No significant cyanosis, clubbing of bilateral lower extremities      Discharge Instructions   Change dressing (specify)    Complete by:  As directed   Dressing change: TID -use Dakin's soln dressings as outlined by general surgery     Diet general    Complete by:  As directed   Pureed with nectar thick liquids- due to lack of dentition or dentures unable to advance diet     Increase activity slowly    Complete by:  As directed             Medication List    STOP taking these medications       TYLENOL CHILDRENS PO      TAKE these medications       amLODipine 10 MG tablet  Commonly known as:  NORVASC  Take 1 tablet (10 mg total) by mouth daily.     antiseptic oral rinse Liqd  15 mLs by Mouth Rinse route QID.     bisacodyl 10 MG suppository  Commonly known as:  DULCOLAX  Place 1 suppository (10 mg total) rectally daily as needed for moderate constipation.     chlorhexidine 0.12 % solution  Commonly known as:  PERIDEX  15 mLs by Mouth Rinse route 2 (two) times daily.     cloNIDine 0.2 mg/24hr patch  Commonly known as:  CATAPRES - Dosed in mg/24 hr  Place 1 patch (0.2 mg total) onto the skin once a week.     dextrose 5 % solution  Infuse at 50 cc/hr     enoxaparin 30 MG/0.3ML injection  Commonly known as:  LOVENOX  Inject 0.3 mLs (30 mg total) into the skin daily.     fentaNYL 0.05 MG/ML injection  Commonly known as:   SUBLIMAZE  Inject 0.25-0.5 mLs (12.5-25 mcg total) into the vein every 2 (two) hours as needed for severe pain.     hydrALAZINE 50 MG tablet  Commonly known as:  APRESOLINE  Take 1 tablet (50 mg total) by mouth every 8 (eight) hours.     MULTI VITAMIN DAILY PO  Take 15 mLs by mouth daily.     piperacillin-tazobactam 3.375 GM/50ML IVPB  Commonly known as:  ZOSYN  Inject 50 mLs (3.375 g total) into the vein every 8 (eight) hours.     sodium chloride 0.9 % injection  Inject 3 mLs into the vein every 12 (twelve) hours.       No Known Allergies  Microbiology: Recent Results (from the past 240 hour(s))  CULTURE, BLOOD (ROUTINE X 2)     Status: None   Collection Time    01/21/14  3:33 PM      Result Value Ref Range  Status   Specimen Description BLOOD LEFT FOREARM   Final   Special Requests BOTTLES DRAWN AEROBIC ONLY 5CC   Final   Culture  Setup Time     Final   Value: 01/21/2014 22:23     Performed at [facility]   Culture     Final   Value: NO GROWTH 5 DAYS     Performed at [facility]   Report Status 01/27/2014 FINAL   Final  CULTURE, BLOOD (ROUTINE X 2)     Status: None   Collection Time    01/21/14  4:45 PM      Result Value Ref Range Status   Specimen Description BLOOD HAND RIGHT   Final   Special Requests BOTTLES DRAWN AEROBIC AND ANAEROBIC [facility]6CC   Final   Culture  Setup Time     Final   Value: 01/21/2014 22:23     Performed at [facility]   Culture     Final   Value: NO GROWTH 5 DAYS     Performed at [facility]   Report Status 01/27/2014 FINAL   Final  URINE CULTURE     Status: None   Collection Time    01/21/14  5:14 PM      Result Value Ref Range Status   Specimen Description URINE, CATHETERIZED   Final   Special Requests NONE   Final   Culture  Setup Time     Final   Value: 01/21/2014 18:55     [facility]formed at [facility]   Colony Count     Final   Value: NO GROWTH     Performed at [facility]   Culture      Final   Value: NO GROWTH     Performed at [facility]   Report Status 01/22/2014 FINAL   Final  MRSA PCR SCREENING     Status: None   Collection Time    01/21/14 10:18 PM      Result Value Ref Range Status   MRSA by PCR NEGATIVE  NEGATIVE Final   Comment:            The GeneXpert MRSA Assay (FDA     approved for NASAL specimens     only), is one component of a     comprehensive MRSA colonization     surveillance program. It is not     intended to diagnose MRSA     infection nor to guide or     monitor treatment for     MRSA infections.    Labs: Basic Metabolic Panel:  Recent Labs Lab 01/22/14 0130 01/23/14 0230 01/25/14 0238 01/26/14 0350 01/27/14  NA 155* 151* 147 144 143  K 3.8 4.3 3.5* 4.1 4.0  CL 120* 117* 111 109 106  CO2 24 21 22 21 20   GLUCOSE 166* 91 94 83 114*  BUN 42* 33* 19 14 12   CREATININE 1.48* 1.40* 1.09 0.94 0.97  CALCIUM 8.1* 8.5 8.6 8.5 8.7   Liver Function Tests:  Recent Labs Lab 01/21/14 1645 01/22/14 0130 01/23/14 0230 01/26/14 0350  AST 101* 152* 211* 47*  ALT 130* 126* 160* 61*  ALKPHOS 129* 148* 165* 127*  BILITOT 0.5 0.5 0.5 0.7  PROT 6.7 5.8* 5.9* 5.7*  ALBUMIN 2.2* 2.0* 1.9* 1.8*   CBC:  Recent Labs Lab 01/21/14 1645  01/22/14 0130 01/23/14 0230 01/25/14 0238 01/26/14 0350 01/27/14  WBC 18.7*  < > 14.6* 17.6*  14.1* 12.9* 14.9*  NEUTROABS 15.7*  --  12.7*  --   --   --   --   HGB 11.1*  < > 9.3* 8.8* 8.7* 8.5* 9.5*  HCT 34.5*  < > 29.1* 27.1* 26.0* 25.2* 28.1*  MCV 82.1  < > 82.0 79.7 76.5* 77.3* 76.2*  PLT 295  < > 218 223 308 311 404*  < > = values in this interval not displayed.  Cardiac Enzymes:  Recent Labs Lab 01/21/14 1645  TROPONINI <0.30   CBG:  Recent Labs Lab 01/22/14 1433 01/22/14 1505 01/22/14 1858 01/22/14 2329 01/23/14 0613  GLUCAP 68* 111* 80 75 116*   Signed:  ELLIS,ALLISON L. ANP [facility] 01/27/2014, 10:58 AM  I have personally examined this patient and  reviewed the entire database. I have reviewed the above note, made any necessary editorial changes, and agree with its content.  Lonia Blood, MD [facility]

## 2014-01-27 NOTE — Progress Notes (Signed)
6 Days Post-Op  Subjective: No new changes  Objective: Vital signs in last 24 hours: Temp:  [97.5 F (36.4 C)-98.5 F (36.9 C)] 98.4 F (36.9 C) (07/13 0000) Pulse Rate:  [87-106] 94 (07/13 0330) Resp:  [22-30] 30 (07/13 0330) BP: (121-178)/(57-90) 131/73 mmHg (07/13 0330) SpO2:  [99 %-100 %] 100 % (07/13 0330) Last BM Date:  (unknown-PTA)  Intake/Output from previous day: 07/12 0701 - 07/13 0700 In: 403 [P.O.:270; I.V.:133] Out: 675 [Urine:675] Intake/Output this shift:    Decub remains stable, fairly clean  Lab Results:   Recent Labs  01/26/14 0350 01/27/14  WBC 12.9* 14.9*  HGB 8.5* 9.5*  HCT 25.2* 28.1*  PLT 311 404*   BMET  Recent Labs  01/26/14 0350 01/27/14  NA 144 143  K 4.1 4.0  CL 109 106  CO2 21 20  GLUCOSE 83 114*  BUN 14 12  CREATININE 0.94 0.97  CALCIUM 8.5 8.7   PT/INR No results found for this basename: LABPROT, INR,  in the last 72 hours ABG No results found for this basename: PHART, PCO2, PO2, HCO3,  in the last 72 hours  Studies/Results: No results found.  Anti-infectives: Anti-infectives   Start     Dose/Rate Route Frequency Ordered Stop   01/26/14 0600  vancomycin (VANCOCIN) IVPB 750 mg/150 ml premix  Status:  Discontinued     750 mg 150 mL/hr over 60 Minutes Intravenous Every 24 hours 01/25/14 1316 01/26/14 1525   01/23/14 1600  vancomycin (VANCOCIN) IVPB 1000 mg/200 mL premix  Status:  Discontinued     1,000 mg 200 mL/hr over 60 Minutes Intravenous Every 48 hours 01/21/14 1738 01/23/14 0723   01/23/14 0800  vancomycin (VANCOCIN) 500 mg in sodium chloride 0.9 % 100 mL IVPB  Status:  Discontinued     500 mg 100 mL/hr over 60 Minutes Intravenous Every 24 hours 01/23/14 0723 01/25/14 1316   01/22/14 0015  clindamycin (CLEOCIN) IVPB 600 mg  Status:  Discontinued     600 mg 100 mL/hr over 30 Minutes Intravenous 3 times per day 01/22/14 0009 01/22/14 0955   01/21/14 2130  piperacillin-tazobactam (ZOSYN) IVPB 2.25 g     2.25  g 100 mL/hr over 30 Minutes Intravenous Every 6 hours 01/21/14 1738     01/21/14 1500  piperacillin-tazobactam (ZOSYN) IVPB 3.375 g     3.375 g 100 mL/hr over 30 Minutes Intravenous  Once 01/21/14 1459 01/21/14 1543   01/21/14 1500  vancomycin (VANCOCIN) IVPB 1000 mg/200 mL premix     1,000 mg 200 mL/hr over 60 Minutes Intravenous  Once 01/21/14 1459 01/21/14 1650      Assessment/Plan: s/p Procedure(s): IRRIGATION AND DEBRIDEMENT Sacral Decubitus Ulcer (N/A)  Continue local wound care. Will see again wednesday  LOS: 6 days    Michelle Hooper A 01/27/2014

## 2014-01-27 NOTE — Progress Notes (Signed)
01/27/2014 1000 NCM attempted call to dtr to give info on transfer to [facility]. Left message. Isidoro DonningAlesia Isaac Lacson RN CCM Case Mgmt phone [phone]

## 2014-01-27 NOTE — Progress Notes (Signed)
Attempted to call [facility] to give report but receiving nurse is not ready to take report.

## 2014-01-28 ENCOUNTER — Encounter: Payer: Self-pay | Admitting: Radiology

## 2014-01-28 LAB — CBC
HCT: 26.1 % — ABNORMAL LOW (ref 36.0–46.0)
Hemoglobin: 8.7 g/dL — ABNORMAL LOW (ref 12.0–15.0)
MCH: 26 pg (ref 26.0–34.0)
MCHC: 33.3 g/dL (ref 30.0–36.0)
MCV: 77.9 fL — ABNORMAL LOW (ref 78.0–100.0)
PLATELETS: 323 10*3/uL (ref 150–400)
RBC: 3.35 MIL/uL — AB (ref 3.87–5.11)
RDW: 14 % (ref 11.5–15.5)
WBC: 11.5 10*3/uL — AB (ref 4.0–10.5)

## 2014-01-28 LAB — VANCOMYCIN, TROUGH: VANCOMYCIN TR: 10.2 ug/mL (ref 10.0–20.0)

## 2014-01-28 LAB — PROCALCITONIN: PROCALCITONIN: 0.91 ng/mL

## 2014-01-28 LAB — SEDIMENTATION RATE: SED RATE: 101 mm/h — AB (ref 0–22)

## 2014-01-28 LAB — C-REACTIVE PROTEIN: CRP: 8.8 mg/dL — AB (ref <0.60)

## 2014-01-28 LAB — TSH: TSH: 5.82 u[IU]/mL — AB (ref 0.350–4.500)

## 2014-01-28 NOTE — H&P (Signed)
Chief Complaint: Malnutrition. Referring Physician: Dr. Arnette Norris HPI: Michelle Hooper is an 80 y.o. female with alzheimer's dementia who was transferred to [facility] from [facility] on 01/27/14 for long term care. She previously was treated for sepsis secondary to coccygeal ulcer s/p extensive excision and debridement on 01/21/14, acute respiratory failure which has resolved and she is stable on RA and acute on chronic kidney disease. The patient has been on IV zosyn and is afebrile with wound vac intact. IR received request for percutaneous gastrostomy tube placement for malnutrition.   Past Medical History:  Past Medical History  Diagnosis Date  . SBO (small bowel obstruction)   . Hypertension   . Alzheimer's dementia   . Iron deficiency anemia   . Frequent UTI     Past Surgical History:  Past Surgical History  Procedure Laterality Date  . Exploratory laparotomy, lysis of adhesions, small bowel  07/30/10  . Irrigation and debridement abscess N/A 01/21/2014    Procedure: IRRIGATION AND DEBRIDEMENT Sacral Decubitus Ulcer;  Surgeon: Axel Filler, MD;  Location: [facility] OR;  Service: General;  Laterality: N/A;    Family History:  Family History  Problem Relation Age of Onset  . Cancer Son     Colon cancer  . Diabetes Son   . Diabetes Sister   . Hypertension Sister   . Stroke Sister     Social History:  reports that she has never smoked. She has never used smokeless tobacco. She reports that she does not drink alcohol. Her drug history is not on file.  Allergies: No Known Allergies  Medications:   Medication List    ASK your doctor about these medications       amLODipine 10 MG tablet  Commonly known as:  NORVASC  Take 1 tablet (10 mg total) by mouth daily.     antiseptic oral rinse Liqd  15 mLs by Mouth Rinse route QID.     bisacodyl 10 MG suppository  Commonly known as:  DULCOLAX  Place 1 suppository (10 mg total) rectally daily as needed for moderate constipation.     chlorhexidine  0.12 % solution  Commonly known as:  PERIDEX  15 mLs by Mouth Rinse route 2 (two) times daily.     cloNIDine 0.2 mg/24hr patch  Commonly known as:  CATAPRES - Dosed in mg/24 hr  Place 1 patch (0.2 mg total) onto the skin once a week.     dextrose 5 % solution  Infuse at 50 cc/hr     enoxaparin 30 MG/0.3ML injection  Commonly known as:  LOVENOX  Inject 0.3 mLs (30 mg total) into the skin daily.     fentaNYL 0.05 MG/ML injection  Commonly known as:  SUBLIMAZE  Inject 0.25-0.5 mLs (12.5-25 mcg total) into the vein every 2 (two) hours as needed for severe pain.     hydrALAZINE 50 MG tablet  Commonly known as:  APRESOLINE  Take 1 tablet (50 mg total) by mouth every 8 (eight) hours.     MULTI VITAMIN DAILY PO  Take 15 mLs by mouth daily.     piperacillin-tazobactam 3.375 GM/50ML IVPB  Commonly known as:  ZOSYN  Inject 50 mLs (3.375 g total) into the vein every 8 (eight) hours.     sodium chloride 0.9 % injection  Inject 3 mLs into the vein every 12 (twelve) hours.       ROS unable to be obtained secondary to patient's dementia.   Physical Exam: T: 96.14F, HR: 84bpm, BP: 132/78 mmHg, O2:  99% RA  General Appearance:  Alert, non verbal, no distress  Head:  Normocephalic, without obvious abnormality, atraumatic  Neck: Supple, symmetrical, trachea midline  Lungs:   Clear to auscultation bilaterally, poor inspiratory effort.  Chest Wall:  No tenderness or deformity  Heart:  Regular rate and rhythm, S1, S2 normal, no murmur, rub or gallop.  Back: Coccygeal ulcer with wound dressing and catheter intact  Abdomen:   Soft, non-tender, non distended, hypoBS  Extremities: Extremities contracted  Neurologic: Non verbal.   Results for orders placed during the hospital encounter of 01/27/14 (from the past 48 hour(s))  TSH     Status: Abnormal   Collection Time    01/28/14  5:00 AM      Result Value Ref Range   TSH 5.820 (*) 0.350 - 4.500 uIU/mL  CBC     Status: Abnormal    Collection Time    01/28/14  6:05 AM      Result Value Ref Range   WBC 11.5 (*) 4.0 - 10.5 K/uL   RBC 3.35 (*) 3.87 - 5.11 MIL/uL   Hemoglobin 8.7 (*) 12.0 - 15.0 g/dL   HCT 16.1 (*) 09.6 - 04.5 %   MCV 77.9 (*) 78.0 - 100.0 fL   MCH 26.0  26.0 - 34.0 pg   MCHC 33.3  30.0 - 36.0 g/dL   RDW 40.9  81.1 - 91.4 %   Platelets 323  150 - 400 K/uL  SEDIMENTATION RATE     Status: Abnormal   Collection Time    01/28/14  6:05 AM      Result Value Ref Range   Sed Rate 101 (*) 0 - 22 mm/hr  C-REACTIVE PROTEIN     Status: Abnormal   Collection Time    01/28/14  6:05 AM      Result Value Ref Range   CRP 8.8 (*) <0.60 mg/dL   Comment: Performed at [facility]  PROCALCITONIN     Status: None   Collection Time    01/28/14  6:05 AM      Result Value Ref Range   Procalcitonin 0.91     Comment:            Interpretation:     PCT > 0.5 ng/mL and <= 2 ng/mL:     Systemic infection (sepsis) is possible,     but other conditions are known to elevate     PCT as well.     (NOTE)             ICU PCT Algorithm               Non ICU PCT Algorithm        ----------------------------     ------------------------------             PCT < 0.25 ng/mL                 PCT < 0.1 ng/mL         Stopping of antibiotics            Stopping of antibiotics           strongly encouraged.               strongly encouraged.        ----------------------------     ------------------------------           PCT level decrease by  PCT < 0.25 ng/mL           >= 80% from peak PCT           OR PCT 0.25 - 0.5 ng/mL          Stopping of antibiotics                                                 encouraged.         Stopping of antibiotics               encouraged.        ----------------------------     ------------------------------           PCT level decrease by              PCT >= 0.25 ng/mL           < 80% from peak PCT            AND PCT >= 0.5 ng/mL            Continuing antibiotics                                                   encouraged.           Continuing antibiotics                encouraged.        ----------------------------     ------------------------------         PCT level increase compared          PCT > 0.5 ng/mL             with peak PCT AND              PCT >= 0.5 ng/mL             Escalation of antibiotics                                              strongly encouraged.          Escalation of antibiotics            strongly encouraged.  VANCOMYCIN, TROUGH     Status: None   Collection Time    01/28/14  7:34 AM      Result Value Ref Range   Vancomycin Tr 10.2  10.0 - 20.0 ug/mL   No results found.  Assessment/Plan Alzheimer's dementia. Anemia of chronic disease. Severe sepsis, resolved.  Malnutrition. Poor healing of decubitus ulcer of coccygeal region, stage 4 s/p extensive excision and debridement 01/21/14 on IV zosyn with wound care. Request for percutaneous gastrostomy tube placement. Patient will be NPO after midnight, lovenox held, labs ordered, on IV zosyn Q8hrs, afebrile Labs reviewed, previous imaging reviewed with Dr. Fredia Sorrow with large stool burden Dr. Arnette Norris will order fleet enema tonight, IR will check KUB in am to evaluate stool burden Risks and Benefits discussed with the patient's daughter by [facility], daughter has signed the consent, I have attempted to call the daughter today with  no response. Consent signed and in chart.   Pattricia BossMORGAN, Ellyse Rotolo D PA-C 01/28/2014, 4:24 PM

## 2014-01-29 ENCOUNTER — Other Ambulatory Visit (HOSPITAL_COMMUNITY): Payer: Self-pay

## 2014-01-29 LAB — PROTIME-INR
INR: 1.27 (ref 0.00–1.49)
PROTHROMBIN TIME: 15.9 s — AB (ref 11.6–15.2)

## 2014-01-29 LAB — CBC
HCT: 24.9 % — ABNORMAL LOW (ref 36.0–46.0)
HEMOGLOBIN: 8.3 g/dL — AB (ref 12.0–15.0)
MCH: 25.8 pg — AB (ref 26.0–34.0)
MCHC: 33.3 g/dL (ref 30.0–36.0)
MCV: 77.3 fL — ABNORMAL LOW (ref 78.0–100.0)
Platelets: 380 10*3/uL (ref 150–400)
RBC: 3.22 MIL/uL — AB (ref 3.87–5.11)
RDW: 14.4 % (ref 11.5–15.5)
WBC: 13.8 10*3/uL — AB (ref 4.0–10.5)

## 2014-01-29 LAB — PROCALCITONIN: Procalcitonin: 0.55 ng/mL

## 2014-01-29 MED ORDER — FENTANYL CITRATE 0.05 MG/ML IJ SOLN
INTRAMUSCULAR | Status: AC
  Filled 2014-01-29: qty 2

## 2014-01-29 MED ORDER — IOHEXOL 300 MG/ML  SOLN
50.0000 mL | Freq: Once | INTRAMUSCULAR | Status: AC | PRN
  Administered 2014-01-29: 15 mL via INTRAVENOUS

## 2014-01-29 MED ORDER — CEFAZOLIN SODIUM-DEXTROSE 2-3 GM-% IV SOLR
INTRAVENOUS | Status: AC
  Filled 2014-01-29: qty 50

## 2014-01-29 MED ORDER — FENTANYL CITRATE 0.05 MG/ML IJ SOLN
INTRAMUSCULAR | Status: AC | PRN
Start: 2014-01-29 — End: 2014-01-29
  Administered 2014-01-29: 25 ug via INTRAVENOUS

## 2014-01-29 MED ORDER — MIDAZOLAM HCL 2 MG/2ML IJ SOLN
INTRAMUSCULAR | Status: AC
  Filled 2014-01-29: qty 2

## 2014-01-29 MED ORDER — GLUCAGON HCL RDNA (DIAGNOSTIC) 1 MG IJ SOLR
INTRAMUSCULAR | Status: AC
  Filled 2014-01-29: qty 1

## 2014-01-29 MED ORDER — MIDAZOLAM HCL 2 MG/2ML IJ SOLN
INTRAMUSCULAR | Status: AC | PRN
  Administered 2014-01-29: 1 mg via INTRAVENOUS

## 2014-01-29 MED ORDER — CEFAZOLIN (ANCEF) 1 G IV SOLR
2.0000 g | INTRAVENOUS | Status: AC
  Administered 2014-01-29: 2 g

## 2014-01-29 NOTE — H&P (Signed)
Agree with assessment.  Will proceed with gastrostomy tube on 7/15.

## 2014-01-29 NOTE — Progress Notes (Signed)
[facility]                                                                                              Progress note     Patient Demographics  Michelle Hooper, is a 80 y.o. female  ZOX:[id number]  WJX:[id number]  DOB - [birth year]  Admit date - 01/27/2014  Admitting Physician Elnora MorrisonAhmad B Barakat, MD  Outpatient Primary MD for the patient is [name],[name], MD  LOS - 2   Chief complaint Wound Necrotizing fasciitis Sepsis     Subjective:   Michelle KurtzGeneva Holdsworth today and cannot give any history due to to advanced dementia  Objective:   Vital signs  Temperature 99.7 Heart rate 92 Respiratory rate 16 Blood pressure 133/74 Pulse ox 96%    Exam Alert and Confused, No new F.N deficits,  Anchor.AT,PERRAL Supple Neck,No JVD, No cervical lymphadenopathy appriciated.  Symmetrical Chest wall movement, diminished,  RRR,No Gallops,Rubs or new Murmurs, No Parasternal Heave +ve B.Sounds, Abd Soft, Non tender, No organomegaly appriciated, No rebound - guarding or rigidity. No Cyanosis, Clubbing or edema, No new Rash or bruise     I&Os -100       Data Review   CBC  Recent Labs Lab 01/25/14 0238 01/26/14 0350 01/27/14 01/28/14 0605 01/29/14 0415  WBC 14.1* 12.9* 14.9* 11.5* 13.8*  HGB 8.7* 8.5* 9.5* 8.7* 8.3*  HCT 26.0* 25.2* 28.1* 26.1* 24.9*  PLT 308 311 404* 323 380  MCV 76.5* 77.3* 76.2* 77.9* 77.3*  MCH 25.6* 26.1 25.7* 26.0 25.8*  MCHC 33.5 33.7 33.8 33.3 33.3  RDW 13.6 13.8 13.7 14.0 14.4    Chemistries   Recent Labs Lab 01/23/14 0230 01/25/14 0238 01/26/14 0350 01/27/14  NA 151* 147 144 143  K 4.3 3.5* 4.1 4.0  CL 117* 111 109 106  CO2 21 22 21 20   GLUCOSE 91 94 83 114*  BUN 33* 19 14 12   CREATININE 1.40* 1.09 0.94 0.97  CALCIUM 8.5 8.6 8.5 8.7  AST 211*  --  47*  --   ALT 160*  --  61*  --   ALKPHOS 165*  --  127*  --   BILITOT 0.5  --  0.7  --     ------------------------------------------------------------------------------------------------------------------ CrCl is unknown because both a height and weight (above a minimum accepted value) are required for this calculation. ------------------------------------------------------------------------------------------------------------------ No results found for this basename: HGBA1C,  in the last 72 hours ------------------------------------------------------------------------------------------------------------------ No results found for this basename: CHOL, HDL, LDLCALC, TRIG, CHOLHDL, LDLDIRECT,  in the last 72 hours ------------------------------------------------------------------------------------------------------------------  Recent Labs  01/28/14 0500  TSH 5.820*   ------------------------------------------------------------------------------------------------------------------ No results found for this basename: VITAMINB12, FOLATE, FERRITIN, TIBC, IRON, RETICCTPCT,  in the last 72 hours  Coagulation profile  Recent Labs Lab 01/29/14 0415  INR 1.27    No results found for this basename: DDIMER,  in the last 72 hours  Cardiac Enzymes No results found for this basename: CK, CKMB, TROPONINI, MYOGLOBIN,  in the last 168 hours ------------------------------------------------------------------------------------------------------------------ No components found with this basename: POCBNP,   Micro Results Recent Results (from the past 240 hour(s))  CULTURE,  BLOOD (ROUTINE X 2)     Status: None   Collection Time    01/21/14  3:33 PM      Result Value Ref Range Status   Specimen Description BLOOD LEFT FOREARM   Final   Special Requests BOTTLES DRAWN AEROBIC ONLY 5CC   Final   Culture  Setup Time     Final   Value: 01/21/2014 22:23     Performed at [facility]   Culture     Final   Value: NO GROWTH 5 DAYS     Performed at [facility]   Report  Status 01/27/2014 FINAL   Final  CULTURE, BLOOD (ROUTINE X 2)     Status: None   Collection Time    01/21/14  4:45 PM      Result Value Ref Range Status   Specimen Description BLOOD HAND RIGHT   Final   Special Requests BOTTLES DRAWN AEROBIC AND ANAEROBIC [facility]   Final   Culture  Setup Time     Final   Value: 01/21/2014 22:23     Performed at [facility]   Culture     Final   Value: NO GROWTH 5 DAYS     Performed at [facility]   Report Status 01/27/2014 FINAL   Final  URINE CULTURE     Status: None   Collection Time    01/21/14  5:14 PM      Result Value Ref Range Status   Specimen Description URINE, CATHETERIZED   Final   Special Requests NONE   Final   Culture  Setup Time     Final   Value: 01/21/2014 18:55     Performed at [facility]     Final   Value: NO GROWTH     Performed at [facility]   Culture     Final   Value: NO GROWTH     Performed at [facility]   Report Status 01/22/2014 FINAL   Final  MRSA PCR SCREENING     Status: None   Collection Time    01/21/14 10:18 PM      Result Value Ref Range Status   MRSA by PCR NEGATIVE  NEGATIVE Final   Comment:            The GeneXpert MRSA Assay (FDA     approved for NASAL specimens     only), is one component of a     comprehensive MRSA colonization     surveillance program. It is not     intended to diagnose MRSA     infection nor to guide or     monitor treatment for     MRSA infections.       Assessment & Plan   Respiratory failure, extubated prior to admission SIRS continue with IV antibiotics Stage IV sacral decubitus ulcer status post surgical debridement and aggressive wound care with IV antibiotics Routine calorie malnutrition/dysphasia on pured diets. High risk of aspiration Severe dementia with poor oral intake Hypertension controlled Anemia of chronic disease  Plan  Consult IR for G-tube placement  Code Status: DO NOT  RESUSCITATE   DVT Prophylaxis  Lovenox    Carron Curie M.D on 01/29/2014 at 11:38 AM

## 2014-01-29 NOTE — Sedation Documentation (Signed)
Pt moaned w/ moving to procedure table. Wound to coccyx. Grimacing noted.  Subsided once pt still on table.

## 2014-01-29 NOTE — Procedures (Signed)
Procedure:  Gastrostomy tube placement Findings:  20 Fr bumper retention gastrostomy placed with tip in body of stomach OK to use for feeds tomorrow.

## 2014-01-30 ENCOUNTER — Other Ambulatory Visit (HOSPITAL_COMMUNITY): Payer: Self-pay

## 2014-01-30 LAB — PROCALCITONIN: Procalcitonin: 0.39 ng/mL

## 2014-01-31 LAB — URINE MICROSCOPIC-ADD ON

## 2014-01-31 LAB — URINALYSIS, ROUTINE W REFLEX MICROSCOPIC
Bilirubin Urine: NEGATIVE
Glucose, UA: NEGATIVE mg/dL
HGB URINE DIPSTICK: NEGATIVE
Ketones, ur: 15 mg/dL — AB
Leukocytes, UA: NEGATIVE
Nitrite: NEGATIVE
PH: 5.5 (ref 5.0–8.0)
Protein, ur: 30 mg/dL — AB
SPECIFIC GRAVITY, URINE: 1.024 (ref 1.005–1.030)
Urobilinogen, UA: 0.2 mg/dL (ref 0.0–1.0)

## 2014-01-31 LAB — VANCOMYCIN, TROUGH: Vancomycin Tr: 18.6 ug/mL (ref 10.0–20.0)

## 2014-02-01 ENCOUNTER — Encounter: Payer: Self-pay | Admitting: Radiology

## 2014-02-01 ENCOUNTER — Other Ambulatory Visit (HOSPITAL_COMMUNITY): Payer: Medicare Other

## 2014-02-01 LAB — BASIC METABOLIC PANEL
Anion gap: 10 (ref 5–15)
BUN: 23 mg/dL (ref 6–23)
CALCIUM: 8.1 mg/dL — AB (ref 8.4–10.5)
CO2: 26 mEq/L (ref 19–32)
CREATININE: 0.8 mg/dL (ref 0.50–1.10)
Chloride: 118 mEq/L — ABNORMAL HIGH (ref 96–112)
GFR calc Af Amer: 75 mL/min — ABNORMAL LOW (ref >90)
GFR, EST NON AFRICAN AMERICAN: 64 mL/min — AB (ref >90)
Glucose, Bld: 128 mg/dL — ABNORMAL HIGH (ref 70–99)
Potassium: 3.4 mEq/L — ABNORMAL LOW (ref 3.7–5.3)
Sodium: 154 mEq/L — ABNORMAL HIGH (ref 137–147)

## 2014-02-01 LAB — URINE CULTURE
COLONY COUNT: NO GROWTH
CULTURE: NO GROWTH

## 2014-02-01 LAB — CBC
HEMATOCRIT: 24.2 % — AB (ref 36.0–46.0)
Hemoglobin: 8 g/dL — ABNORMAL LOW (ref 12.0–15.0)
MCH: 25.8 pg — ABNORMAL LOW (ref 26.0–34.0)
MCHC: 33.1 g/dL (ref 30.0–36.0)
MCV: 78.1 fL (ref 78.0–100.0)
Platelets: 336 10*3/uL (ref 150–400)
RBC: 3.1 MIL/uL — AB (ref 3.87–5.11)
RDW: 15.8 % — ABNORMAL HIGH (ref 11.5–15.5)
WBC: 13.9 10*3/uL — ABNORMAL HIGH (ref 4.0–10.5)

## 2014-02-01 MED ORDER — IOHEXOL 300 MG/ML  SOLN
100.0000 mL | Freq: Once | INTRAMUSCULAR | Status: AC | PRN
Start: 2014-02-01 — End: 2014-02-01
  Administered 2014-02-01: 100 mL via INTRAVENOUS

## 2014-02-02 LAB — BASIC METABOLIC PANEL
Anion gap: 7 (ref 5–15)
BUN: 25 mg/dL — AB (ref 6–23)
CALCIUM: 7.6 mg/dL — AB (ref 8.4–10.5)
CO2: 26 mEq/L (ref 19–32)
Chloride: 123 mEq/L — ABNORMAL HIGH (ref 96–112)
Creatinine, Ser: 0.73 mg/dL (ref 0.50–1.10)
GFR calc Af Amer: 87 mL/min — ABNORMAL LOW (ref >90)
GFR, EST NON AFRICAN AMERICAN: 75 mL/min — AB (ref >90)
GLUCOSE: 94 mg/dL (ref 70–99)
Potassium: 3.9 mEq/L (ref 3.7–5.3)
SODIUM: 156 meq/L — AB (ref 137–147)

## 2014-02-03 LAB — BASIC METABOLIC PANEL
Anion gap: 10 (ref 5–15)
BUN: 30 mg/dL — AB (ref 6–23)
CALCIUM: 8.2 mg/dL — AB (ref 8.4–10.5)
CO2: 27 mEq/L (ref 19–32)
Chloride: 116 mEq/L — ABNORMAL HIGH (ref 96–112)
Creatinine, Ser: 0.69 mg/dL (ref 0.50–1.10)
GFR calc Af Amer: 88 mL/min — ABNORMAL LOW (ref >90)
GFR calc non Af Amer: 76 mL/min — ABNORMAL LOW (ref >90)
GLUCOSE: 107 mg/dL — AB (ref 70–99)
Potassium: 4.3 mEq/L (ref 3.7–5.3)
Sodium: 153 mEq/L — ABNORMAL HIGH (ref 137–147)

## 2014-02-03 NOTE — Progress Notes (Signed)
[facility]                                                                                              Progress note     Patient Demographics  Michelle Hooper, is a 80 y.o. female  ZOX:[id number]  WJX:[id number]  DOB - 12/31/1924  Admit date - 01/27/2014  Admitting Physician Elnora MorrisonAhmad B Barakat, MD  Outpatient Primary MD for the patient is [name],[name], MD  LOS - 7   Chief complaint Wound Necrotizing fasciitis Sepsis     Subjective:   Michelle Hooper today and cannot give any history due to to advanced dementia  Objective:   Vital signs  Temperature 99.7 Heart rate 92 Respiratory rate 16 Blood pressure 133/74 Pulse ox 96%    Exam Alert and Confused, No new F.N deficits,  Learned.AT,PERRAL Supple Neck,No JVD, No cervical lymphadenopathy appriciated.  Symmetrical Chest wall movement, diminished,  RRR,No Gallops,Rubs or new Murmurs, No Parasternal Heave +ve B.Sounds, Abd Soft, Non tender, No organomegaly appriciated, No rebound - guarding or rigidity. No Cyanosis, Clubbing or edema, No new Rash or bruise     I&Os -100       Data Review   CBC  Recent Labs Lab 01/28/14 0605 01/29/14 0415 02/01/14 0550  WBC 11.5* 13.8* 13.9*  HGB 8.7* 8.3* 8.0*  HCT 26.1* 24.9* 24.2*  PLT 323 380 336  MCV 77.9* 77.3* 78.1  MCH 26.0 25.8* 25.8*  MCHC 33.3 33.3 33.1  RDW 14.0 14.4 15.8*    Chemistries   Recent Labs Lab 02/01/14 0550 02/02/14 0622 02/03/14 0505  NA 154* 156* 153*  K 3.4* 3.9 4.3  CL 118* 123* 116*  CO2 26 26 27   GLUCOSE 128* 94 107*  BUN 23 25* 30*  CREATININE 0.80 0.73 0.69  CALCIUM 8.1* 7.6* 8.2*   ------------------------------------------------------------------------------------------------------------------ CrCl is unknown because both a height and weight (above a minimum accepted value) are required for this  calculation. ------------------------------------------------------------------------------------------------------------------ No results found for this basename: HGBA1C,  in the last 72 hours ------------------------------------------------------------------------------------------------------------------ No results found for this basename: CHOL, HDL, LDLCALC, TRIG, CHOLHDL, LDLDIRECT,  in the last 72 hours ------------------------------------------------------------------------------------------------------------------ No results found for this basename: TSH, T4TOTAL, FREET3, T3FREE, THYROIDAB,  in the last 72 hours ------------------------------------------------------------------------------------------------------------------ No results found for this basename: VITAMINB12, FOLATE, FERRITIN, TIBC, IRON, RETICCTPCT,  in the last 72 hours  Coagulation profile  Recent Labs Lab 01/29/14 0415  INR 1.27    No results found for this basename: DDIMER,  in the last 72 hours  Cardiac Enzymes No results found for this basename: CK, CKMB, TROPONINI, MYOGLOBIN,  in the last 168 hours ------------------------------------------------------------------------------------------------------------------ No components found with this basename: POCBNP,   Micro Results Recent Results (from the past 240 hour(s))  URINE CULTURE     Status: None   Collection Time    01/31/14 11:48 AM      Result Value Ref Range Status   Specimen Description URINE, RANDOM   Final   Special Requests NONE   Final   Culture  Setup Time     Final   Value: 01/31/2014 17:59  Performed at [facility]     Final   Value: NO GROWTH     Performed at [facility]   Culture     Final   Value: NO GROWTH     Performed at [facility]   Report Status 02/01/2014 FINAL   Final  CULTURE, BLOOD (ROUTINE X 2)     Status: None   Collection Time    01/31/14 12:00 PM      Result Value Ref  Range Status   Specimen Description BLOOD PICC LINE   Final   Special Requests BOTTLES DRAWN AEROBIC AND ANAEROBIC 10CC EACH   Final   Culture  Setup Time     Final   Value: 01/31/2014 16:32     Performed at [facility]   Culture     Final   Value:        BLOOD CULTURE RECEIVED NO GROWTH TO DATE CULTURE WILL BE HELD FOR 5 DAYS BEFORE ISSUING A FINAL NEGATIVE REPORT     Performed at [facility]   Report Status PENDING   Incomplete  CULTURE, BLOOD (ROUTINE X 2)     Status: None   Collection Time    01/31/14 12:50 PM      Result Value Ref Range Status   Specimen Description BLOOD LEFT ANTECUBITAL   Final   Special Requests BOTTLES DRAWN AEROBIC ONLY 10CC   Final   Culture  Setup Time     Final   Value: 01/31/2014 16:33     Performed at [facility]   Culture     Final   Value:        BLOOD CULTURE RECEIVED NO GROWTH TO DATE CULTURE WILL BE HELD FOR 5 DAYS BEFORE ISSUING A FINAL NEGATIVE REPORT     Performed at [facility]   Report Status PENDING   Incomplete       Assessment & Plan   Respiratory failure, extubated prior to admission SIRS continue with IV antibiotics Stage IV sacral decubitus ulcer status post surgical debridement and aggressive wound care with IV antibiotics Routine calorie malnutrition/dysphasia on pured diets. High risk of aspiration Severe dementia with poor oral intake Hypertension uncontrolled Anemia of chronic disease Hypernatremia; normal saline stopped and h2o flushes increased , improving  Plan  Poor prognosis Increase Norvasc to 10 mg daily and Lopressor to 25 twice a day Check labs in a.m.  Code Status: DO NOT RESUSCITATE   DVT Prophylaxis  Lovenox    Carron Curie M.D on 02/03/2014 at 2:11 PM

## 2014-02-04 LAB — BASIC METABOLIC PANEL
Anion gap: 10 (ref 5–15)
BUN: 33 mg/dL — AB (ref 6–23)
CALCIUM: 8.5 mg/dL (ref 8.4–10.5)
CO2: 27 mEq/L (ref 19–32)
Chloride: 115 mEq/L — ABNORMAL HIGH (ref 96–112)
Creatinine, Ser: 0.73 mg/dL (ref 0.50–1.10)
GFR calc Af Amer: 87 mL/min — ABNORMAL LOW (ref >90)
GFR calc non Af Amer: 75 mL/min — ABNORMAL LOW (ref >90)
GLUCOSE: 127 mg/dL — AB (ref 70–99)
POTASSIUM: 4.2 meq/L (ref 3.7–5.3)
Sodium: 152 mEq/L — ABNORMAL HIGH (ref 137–147)

## 2014-02-04 NOTE — Progress Notes (Signed)
[facility]                                                                                              Progress note     Patient Demographics  Michelle Hooper, is a 80 y.o. female  WJX:[id number]  [id number]:[id number]  DOB - April 02, 1925  Admit date - 01/27/2014  Admitting Physician Elnora Morrison, MD  Outpatient Primary MD for the patient is Hooper,[name], MD  LOS - 8   Chief complaint Wound Necrotizing fasciitis Sepsis     Subjective:   [name] today and cannot give any history due to to advanced dementia  Objective:   Vital signs  Temperature 99.5 Heart rate 72 Respiratory rate 20 Blood pressure 140/74 Pulse ox 98%    Exam Alert and Confused, No new F.N deficits,  [place].AT,PERRAL Supple Neck,No JVD, No cervical lymphadenopathy appriciated.  Symmetrical Chest wall movement, diminished,  RRR,No Gallops,Rubs or new Murmurs, No Parasternal Heave +ve B.Sounds, Abd Soft, Non tender, No organomegaly appriciated, No rebound - guarding or rigidity. No Cyanosis, Clubbing or edema, No new Rash or bruise     I&Os - 134       Data Review   CBC  Recent Labs Lab 01/29/14 0415 02/01/14 0550  WBC 13.8* 13.9*  HGB 8.3* 8.0*  HCT 24.9* 24.2*  PLT 380 336  MCV 77.3* 78.1  MCH 25.8* 25.8*  MCHC 33.3 33.1  RDW 14.4 15.8*    Chemistries   Recent Labs Lab 02/01/14 0550 02/02/14 0622 02/03/14 0505 02/04/14 0630  NA 154* 156* 153* 152*  K 3.4* 3.9 4.3 4.2  CL 118* 123* 116* 115*  CO2 26 26 27 27   GLUCOSE 128* 94 107* 127*  BUN 23 25* 30* 33*  CREATININE 0.80 0.73 0.69 0.73  CALCIUM 8.1* 7.6* 8.2* 8.5   ------------------------------------------------------------------------------------------------------------------ CrCl is unknown because both a height and weight (above a minimum accepted value) are required for this  calculation. ------------------------------------------------------------------------------------------------------------------ No results found for this basename: HGBA1C,  in the last 72 hours ------------------------------------------------------------------------------------------------------------------ No results found for this basename: CHOL, HDL, LDLCALC, TRIG, CHOLHDL, LDLDIRECT,  in the last 72 hours ------------------------------------------------------------------------------------------------------------------ No results found for this basename: TSH, T4TOTAL, FREET3, T3FREE, THYROIDAB,  in the last 72 hours ------------------------------------------------------------------------------------------------------------------ No results found for this basename: VITAMINB12, FOLATE, FERRITIN, TIBC, IRON, RETICCTPCT,  in the last 72 hours  Coagulation profile  Recent Labs Lab 01/29/14 0415  INR 1.27    No results found for this basename: DDIMER,  in the last 72 hours  Cardiac Enzymes No results found for this basename: CK, CKMB, TROPONINI, MYOGLOBIN,  in the last 168 hours ------------------------------------------------------------------------------------------------------------------ No components found with this basename: POCBNP,   Micro Results Recent Results (from the past 240 hour(s))  URINE CULTURE     Status: None   Collection Time    01/31/14 11:48 AM      Result Value Ref Range Status   Specimen Description URINE, RANDOM   Final   Special Requests NONE   Final   Culture  Setup Time     Final   Value: 01/31/2014 17:59  Performed at [facility]   Colony Count     Final   Value: NO GROWTH     [facility]formed [facility]   Culture     Final   Value: NO GROWTH     [facility]formed at [facility]   Report Status 02/01/2014 FINAL   Final  CULTURE, BLOOD (ROUTINE X 2)     Status: None   Collection Time    01/31/14 12:00 PM      Result Value Ref  Range Status   Specimen Description BLOOD PICC LINE   Final   Special Requests BOTTLES DRAWN AEROBIC AND ANAEROBIC 10CC EACH   Final   Culture  Setup Time     Final   Value: 01/31/2014 16:32     [facility]formed at [facility]   Culture     Final   Value:        BLOOD CULTURE RECEIVED NO GROWTH TO DATE CULTURE WILL BE HELD FOR 5 DAYS BEFORE ISSUING A FINAL NEGATIVE REPORT     Performed at [facility]   Report Status PENDING   Incomplete  CULTURE, BLOOD (ROUTINE X 2)     Status: None   Collection Time    01/31/14 12:50 PM      Result Value Ref Range Status   Specimen Description BLOOD LEFT ANTECUBITAL   Final   Special Requests BOTTLES DRAWN AEROBIC ONLY 10CC   Final   Culture  Setup Time     Final   Value: 01/31/2014 16:33     Performed at [facility]   Culture     Final   Value:        BLOOD CULTURE RECEIVED NO GROWTH TO DATE CULTURE WILL BE HELD FOR 5 DAYS BEFORE ISSUING A FINAL NEGATIVE REPORT     Performed at [facility]   Report Status PENDING   Incomplete       Assessment & Plan   Respiratory failure, extubated prior to admission SIRS continue with IV antibiotics Stage IV sacral decubitus ulcer status post surgical debridement and aggressive wound care with IV antibiotics Routine calorie malnutrition/dysphasia on pured diets. High risk of aspiration Severe dementia with poor oral intake Hypertension uncontrolled Anemia of chronic disease Hypernatremia; normal saline stopped and h2o flushes increased , improving  Plan  Continue same treatment Check BMP in a.m..  Code Status: DO NOT RESUSCITATE   DVT Prophylaxis  Lovenox    Carron CurieHijazi, Mehdi Gironda M.D on 02/04/2014 at 1:17 PM

## 2014-02-05 LAB — BASIC METABOLIC PANEL
Anion gap: 8 (ref 5–15)
BUN: 36 mg/dL — AB (ref 6–23)
CO2: 27 meq/L (ref 19–32)
Calcium: 8.5 mg/dL (ref 8.4–10.5)
Chloride: 112 mEq/L (ref 96–112)
Creatinine, Ser: 0.72 mg/dL (ref 0.50–1.10)
GFR calc Af Amer: 87 mL/min — ABNORMAL LOW (ref >90)
GFR calc non Af Amer: 75 mL/min — ABNORMAL LOW (ref >90)
GLUCOSE: 95 mg/dL (ref 70–99)
POTASSIUM: 4.6 meq/L (ref 3.7–5.3)
SODIUM: 147 meq/L (ref 137–147)

## 2014-02-05 NOTE — Progress Notes (Signed)
[facility]                                                                                              Progress note     Patient Demographics  Michelle Hooper, is a 80 y.o. female  ZOX:[id number]  WJX:[id number]  DOB - January 21, 1925  Admit date - 01/27/2014  Admitting Physician Elnora MorrisonAhmad B Barakat, MD  Outpatient Primary MD for the patient is [name],[name], MD  LOS - 9   Chief complaint Wound Necrotizing fasciitis Sepsis     Subjective:   Michelle Hooper today and cannot give any history due to to advanced dementia  Objective:   Vital signs  Temperature 98.4 Heart rate 80 Respiratory rate 18 Blood pressure 159/83 Pulse ox 98%    Exam Alert and Confused, No new F.N deficits,  [place].AT,PERRAL Supple Neck,No JVD, No cervical lymphadenopathy appriciated.  Symmetrical Chest wall movement, diminished,  RRR,No Gallops,Rubs or new Murmurs, No Parasternal Heave +ve B.Sounds, Abd Soft, Non tender, No organomegaly appriciated, No rebound - guarding or rigidity. No Cyanosis, Clubbing or edema, No new Rash or bruise     I&Os 1084   Data Review   CBC  Recent Labs Lab 02/01/14 0550  WBC 13.9*  HGB 8.0*  HCT 24.2*  PLT 336  MCV 78.1  MCH 25.8*  MCHC 33.1  RDW 15.8*    Chemistries   Recent Labs Lab 02/01/14 0550 02/02/14 0622 02/03/14 0505 02/04/14 0630 02/05/14 0717  NA 154* 156* 153* 152* 147  K 3.4* 3.9 4.3 4.2 4.6  CL 118* 123* 116* 115* 112  CO2 26 26 27 27 27   GLUCOSE 128* 94 107* 127* 95  BUN 23 25* 30* 33* 36*  CREATININE 0.80 0.73 0.69 0.73 0.72  CALCIUM 8.1* 7.6* 8.2* 8.5 8.5   ------------------------------------------------------------------------------------------------------------------ CrCl is unknown because both a height and weight (above a minimum accepted value) are required for this  calculation. ------------------------------------------------------------------------------------------------------------------ No results found for this basename: HGBA1C,  in the last 72 hours ------------------------------------------------------------------------------------------------------------------ No results found for this basename: CHOL, HDL, LDLCALC, TRIG, CHOLHDL, LDLDIRECT,  in the last 72 hours ------------------------------------------------------------------------------------------------------------------ No results found for this basename: TSH, T4TOTAL, FREET3, T3FREE, THYROIDAB,  in the last 72 hours ------------------------------------------------------------------------------------------------------------------ No results found for this basename: VITAMINB12, FOLATE, FERRITIN, TIBC, IRON, RETICCTPCT,  in the last 72 hours  Coagulation profile No results found for this basename: INR, PROTIME,  in the last 168 hours  No results found for this basename: DDIMER,  in the last 72 hours  Cardiac Enzymes No results found for this basename: CK, CKMB, TROPONINI, MYOGLOBIN,  in the last 168 hours ------------------------------------------------------------------------------------------------------------------ No components found with this basename: POCBNP,   Micro Results Recent Results (from the past 240 hour(s))  URINE CULTURE     Status: None   Collection Time    01/31/14 11:48 AM      Result Value Ref Range Status   Specimen Description URINE, RANDOM   Final   Special Requests NONE   Final   Culture  Setup Time     Final   Value: 01/31/2014 17:59  Performed at [facility] Count     Final   Value: NO GROWTH     Performed at [facility]   Culture     Final   Value: NO GROWTH     Performed at [facility]   Report Status 02/01/2014 FINAL   Final  CULTURE, BLOOD (ROUTINE X 2)     Status: None   Collection Time    01/31/14 12:00 PM       Result Value Ref Range Status   Specimen Description BLOOD PICC LINE   Final   Special Requests BOTTLES DRAWN AEROBIC AND ANAEROBIC 10CC EACH   Final   Culture  Setup Time     Final   Value: 01/31/2014 16:32     Performed at [facility]   Culture     Final   Value:        BLOOD CULTURE RECEIVED NO GROWTH TO DATE CULTURE WILL BE HELD FOR 5 DAYS BEFORE ISSUING A FINAL NEGATIVE REPORT     Performed at [facility]   Report Status PENDING   Incomplete  CULTURE, BLOOD (ROUTINE X 2)     Status: None   Collection Time    01/31/14 12:50 PM      Result Value Ref Range Status   Specimen Description BLOOD LEFT ANTECUBITAL   Final   Special Requests BOTTLES DRAWN AEROBIC ONLY 10CC   Final   Culture  Setup Time     Final   Value: 01/31/2014 16:33     Performed at [facility]   Culture     Final   Value:        BLOOD CULTURE RECEIVED NO GROWTH TO DATE CULTURE WILL BE HELD FOR 5 DAYS BEFORE ISSUING A FINAL NEGATIVE REPORT     Performed at [facility]   Report Status PENDING   Incomplete       Assessment & Plan   Respiratory failure, extubated  prior to admission SIRS continue with IV antibiotics Stage IV sacral decubitus ulcer status post surgical debridement and aggressive wound care with IV antibiotics Routine calorie malnutrition/dysphasia on tube feeding Severe dementia with poor oral intake Hypertension uncontrolled Anemia of chronic disease Hypernatremia; normal saline stopped and h2o flushes increased , improving  Plan  Continue same treatment   Code Status: DO NOT RESUSCITATE   DVT Prophylaxis  Lovenox    Carron Curie M.D on 02/05/2014 at 2:07 PM

## 2014-02-06 LAB — CULTURE, BLOOD (ROUTINE X 2)
Culture: NO GROWTH
Culture: NO GROWTH

## 2014-02-06 NOTE — Progress Notes (Signed)
[facility]                                                                                              Progress note     Patient Demographics  Michelle Hooper, is a 80 y.o. female  WUJ:[id number]  [id number]  DOB - Sep 15, 1924  Admit date - 01/27/2014  Admitting Physician Elnora Morrison, MD  Outpatient Primary MD for the patient is [name],[name], MD  LOS - 10   Chief complaint Wound Necrotizing fasciitis Sepsis     Subjective:   [name] today and cannot give any history due to to advanced dementia  Objective:   Vital signs  Temperature 98.4 Heart rate 86 Respiratory rate 18 Blood pressure 132/64 Pulse ox 98%    Exam Alert and Confused, No new F.N deficits,  [place].AT,PERRAL Supple Neck,No JVD, No cervical lymphadenopathy appriciated.  Symmetrical Chest wall movement, diminished,  RRR,No Gallops,Rubs or new Murmurs, No Parasternal Heave +ve B.Sounds, Abd Soft, Non tender, No organomegaly appriciated, No rebound - guarding or rigidity. No Cyanosis, Clubbing or edema, No new Rash or bruise     I&Os +1640   Data Review   CBC  Recent Labs Lab 02/01/14 0550  WBC 13.9*  HGB 8.0*  HCT 24.2*  PLT 336  MCV 78.1  MCH 25.8*  MCHC 33.1  RDW 15.8*    Chemistries   Recent Labs Lab 02/01/14 0550 02/02/14 0622 02/03/14 0505 02/04/14 0630 02/05/14 0717  NA 154* 156* 153* 152* 147  K 3.4* 3.9 4.3 4.2 4.6  CL 118* 123* 116* 115* 112  CO2 26 26 27 27 27   GLUCOSE 128* 94 107* 127* 95  BUN 23 25* 30* 33* 36*  CREATININE 0.80 0.73 0.69 0.73 0.72  CALCIUM 8.1* 7.6* 8.2* 8.5 8.5   ------------------------------------------------------------------------------------------------------------------ CrCl is unknown because both a height and weight (above a minimum accepted value) are required for this  calculation. ------------------------------------------------------------------------------------------------------------------ No results found for this basename: HGBA1C,  in the last 72 hours ------------------------------------------------------------------------------------------------------------------ No results found for this basename: CHOL, HDL, LDLCALC, TRIG, CHOLHDL, LDLDIRECT,  in the last 72 hours ------------------------------------------------------------------------------------------------------------------ No results found for this basename: TSH, T4TOTAL, FREET3, T3FREE, THYROIDAB,  in the last 72 hours ------------------------------------------------------------------------------------------------------------------ No results found for this basename: VITAMINB12, FOLATE, FERRITIN, TIBC, IRON, RETICCTPCT,  in the last 72 hours  Coagulation profile No results found for this basename: INR, PROTIME,  in the last 168 hours  No results found for this basename: DDIMER,  in the last 72 hours  Cardiac Enzymes No results found for this basename: CK, CKMB, TROPONINI, MYOGLOBIN,  in the last 168 hours ------------------------------------------------------------------------------------------------------------------ No components found with this basename: POCBNP,   Micro Results Recent Results (from the past 240 hour(s))  URINE CULTURE     Status: None   Collection Time    01/31/14 11:48 AM      Result Value Ref Range Status   Specimen Description URINE, RANDOM   Final   Special Requests NONE   Final   Culture  Setup Time     Final   Value: 01/31/2014 17:59  Performed at [facility]   Colony Count     Final   Value: NO GROWTH     Performed at [facility]   Culture     Final   Value: NO GROWTH     Performed at [facility]   Report Status 02/01/2014 FINAL   Final  CULTURE, BLOOD (ROUTINE X 2)     Status: None   Collection Time    01/31/14 12:00 PM       Result Value Ref Range Status   Specimen Description BLOOD PICC LINE   Final   Special Requests BOTTLES DRAWN AEROBIC AND ANAEROBIC 10CC EACH   Final   Culture  Setup Time     Final   Value: 01/31/2014 16:32     Performed at [facility]   Culture     Final   Value: NO GROWTH 5 DAYS     Performed at [facility]   Report Status 02/06/2014 FINAL   Final  CULTURE, BLOOD (ROUTINE X 2)     Status: None   Collection Time    01/31/14 12:50 PM      Result Value Ref Range Status   Specimen Description BLOOD LEFT ANTECUBITAL   Final   Special Requests BOTTLES DRAWN AEROBIC ONLY 10CC   Final   Culture  Setup Time     Final   Value: 01/31/2014 16:33     Performed at [facility]   Culture     Final   Value: NO GROWTH 5 DAYS     Performed at [facility]   Report Status 02/06/2014 FINAL   Final       Assessment & Plan   Respiratory failure, extubated  prior to admission SIRS continue with IV antibiotics Stage IV sacral decubitus ulcer status post surgical debridement and aggressive wound care with IV antibiotics Routine calorie malnutrition/dysphasia on tube feeding Severe dementia with poor oral intake Hypertension uncontrolled Anemia of chronic disease Hypernatremia; normal saline stopped and h2o flushes increased , improving  Plan  Continue same treatment   Code Status: DO NOT RESUSCITATE   DVT Prophylaxis  Lovenox    Carron CurieHijazi, Kyesha Balla M.D on 02/06/2014 at 3:47 PM

## 2014-02-07 LAB — VANCOMYCIN, TROUGH: VANCOMYCIN TR: 17.8 ug/mL (ref 10.0–20.0)

## 2014-02-07 NOTE — Progress Notes (Signed)
[facility]                                                                                              Progress note     Patient Demographics  Michelle Hooper, is a 80 y.o. female  RUE:[id number]  [id number]  DOB - 03/05/1925  Admit date - 01/27/2014  Admitting Physician Elnora Morrison, MD  Outpatient Primary MD for the patient is [name],[name], MD  LOS - 11   Chief complaint Wound Necrotizing fasciitis Sepsis     Subjective:   Michelle Hooper today and cannot give any history due to to advanced dementia  Objective:   Vital signs  Temperature 98.8 Heart rate 78 Respiratory rate 16 Blood pressure 124/60 Pulse ox 97 %    Exam Alert and Confused, No new F.N deficits,  [place].AT,PERRAL Supple Neck,No JVD, No cervical lymphadenopathy appriciated.  Symmetrical Chest wall movement, diminished,  RRR,No Gallops,Rubs or new Murmurs, No Parasternal Heave +ve B.Sounds, Abd Soft, Non tender, No organomegaly appriciated, No rebound - guarding or rigidity. No Cyanosis, Clubbing or edema, No new Rash or bruise     I&Os +1420   Data Review   CBC  Recent Labs Lab 02/01/14 0550  WBC 13.9*  HGB 8.0*  HCT 24.2*  PLT 336  MCV 78.1  MCH 25.8*  MCHC 33.1  RDW 15.8*    Chemistries   Recent Labs Lab 02/01/14 0550 02/02/14 0622 02/03/14 0505 02/04/14 0630 02/05/14 0717  NA 154* 156* 153* 152* 147  K 3.4* 3.9 4.3 4.2 4.6  CL 118* 123* 116* 115* 112  CO2 26 26 27 27 27   GLUCOSE 128* 94 107* 127* 95  BUN 23 25* 30* 33* 36*  CREATININE 0.80 0.73 0.69 0.73 0.72  CALCIUM 8.1* 7.6* 8.2* 8.5 8.5   ------------------------------------------------------------------------------------------------------------------ CrCl is unknown because both a height and weight (above a minimum accepted value) are required for this  calculation. ------------------------------------------------------------------------------------------------------------------ No results found for this basename: HGBA1C,  in the last 72 hours ------------------------------------------------------------------------------------------------------------------ No results found for this basename: CHOL, HDL, LDLCALC, TRIG, CHOLHDL, LDLDIRECT,  in the last 72 hours ------------------------------------------------------------------------------------------------------------------ No results found for this basename: TSH, T4TOTAL, FREET3, T3FREE, THYROIDAB,  in the last 72 hours ------------------------------------------------------------------------------------------------------------------ No results found for this basename: VITAMINB12, FOLATE, FERRITIN, TIBC, IRON, RETICCTPCT,  in the last 72 hours  Coagulation profile No results found for this basename: INR, PROTIME,  in the last 168 hours  No results found for this basename: DDIMER,  in the last 72 hours  Cardiac Enzymes No results found for this basename: CK, CKMB, TROPONINI, MYOGLOBIN,  in the last 168 hours ------------------------------------------------------------------------------------------------------------------ No components found with this basename: POCBNP,   Micro Results Recent Results (from the past 240 hour(s))  URINE CULTURE     Status: None   Collection Time    01/31/14 11:48 AM      Result Value Ref Range Status   Specimen Description URINE, RANDOM   Final   Special Requests NONE   Final   Culture  Setup Time     Final   Value: 01/31/2014 17:59  Performed at [facility]   Colony Count     Final   Value: NO GROWTH     [facility]formed at [facility]   Culture     Final   Value: NO GROWTH     Performed at [facility]   Report Status 02/01/2014 FINAL   Final  CULTURE, BLOOD (ROUTINE X 2)     Status: None   Collection Time    01/31/14 12:00 PM       Result Value Ref Range Status   Specimen Description BLOOD PICC LINE   Final   Special Requests BOTTLES DRAWN AEROBIC AND ANAEROBIC 10CC EACH   Final   Culture  Setup Time     Final   Value: 01/31/2014 16:32     Performed at [facility]   Culture     Final   Value: NO GROWTH 5 DAYS     Performed at [facility]   Report Status 02/06/2014 FINAL   Final  CULTURE, BLOOD (ROUTINE X 2)     Status: None   Collection Time    01/31/14 12:50 PM      Result Value Ref Range Status   Specimen Description BLOOD LEFT ANTECUBITAL   Final   Special Requests BOTTLES DRAWN AEROBIC ONLY 10CC   Final   Culture  Setup Time     Final   Value: 01/31/2014 16:33     [facility]formed at [facility]   Culture     Final   Value: NO GROWTH 5 DAYS     [facility]formed at [facility]   Report Status 02/06/2014 FINAL   Final       Assessment & Plan   Respiratory failure, extubated  prior to admission SIRS continue with IV antibiotics Stage IV sacral decubitus ulcer status post surgical debridement and aggressive wound care with IV antibiotics Routine calorie malnutrition/dysphasia on tube feeding Severe dementia with poor oral intake Hypertension uncontrolled Anemia of chronic disease Hypernatremia; normal saline stopped and h2o flushes increased , improving  Plan  Continue same treatment   Code Status: DO NOT RESUSCITATE   DVT Prophylaxis  Lovenox    Carron CurieHijazi, Tarik Teixeira M.D on 02/07/2014 at 11:21 AM

## 2014-02-10 LAB — CBC
HEMATOCRIT: 21.6 % — AB (ref 36.0–46.0)
HEMOGLOBIN: 7 g/dL — AB (ref 12.0–15.0)
MCH: 26.2 pg (ref 26.0–34.0)
MCHC: 32.4 g/dL (ref 30.0–36.0)
MCV: 80.9 fL (ref 78.0–100.0)
Platelets: 320 10*3/uL (ref 150–400)
RBC: 2.67 MIL/uL — AB (ref 3.87–5.11)
RDW: 18.2 % — ABNORMAL HIGH (ref 11.5–15.5)
WBC: 10.1 10*3/uL (ref 4.0–10.5)

## 2014-02-10 LAB — COMPREHENSIVE METABOLIC PANEL
ALK PHOS: 77 U/L (ref 39–117)
ALT: 26 U/L (ref 0–35)
ANION GAP: 9 (ref 5–15)
AST: 35 U/L (ref 0–37)
Albumin: 1.8 g/dL — ABNORMAL LOW (ref 3.5–5.2)
BILIRUBIN TOTAL: 0.2 mg/dL — AB (ref 0.3–1.2)
BUN: 40 mg/dL — ABNORMAL HIGH (ref 6–23)
CHLORIDE: 110 meq/L (ref 96–112)
CO2: 26 meq/L (ref 19–32)
Calcium: 8.3 mg/dL — ABNORMAL LOW (ref 8.4–10.5)
Creatinine, Ser: 0.75 mg/dL (ref 0.50–1.10)
GFR calc Af Amer: 86 mL/min — ABNORMAL LOW (ref >90)
GFR, EST NON AFRICAN AMERICAN: 74 mL/min — AB (ref >90)
Glucose, Bld: 106 mg/dL — ABNORMAL HIGH (ref 70–99)
POTASSIUM: 4.1 meq/L (ref 3.7–5.3)
Sodium: 145 mEq/L (ref 137–147)
Total Protein: 6 g/dL (ref 6.0–8.3)

## 2014-02-10 LAB — C-REACTIVE PROTEIN: CRP: 9 mg/dL — ABNORMAL HIGH (ref <0.60)

## 2014-02-10 LAB — PREALBUMIN: Prealbumin: 13.6 mg/dL — ABNORMAL LOW (ref 17.0–34.0)

## 2014-02-10 LAB — SEDIMENTATION RATE: Sed Rate: >140 mm/hr — ABNORMAL HIGH (ref 0–22)

## 2014-02-11 LAB — BASIC METABOLIC PANEL
Anion gap: 8 (ref 5–15)
BUN: 39 mg/dL — AB (ref 6–23)
CHLORIDE: 113 meq/L — AB (ref 96–112)
CO2: 26 meq/L (ref 19–32)
Calcium: 7.9 mg/dL — ABNORMAL LOW (ref 8.4–10.5)
Creatinine, Ser: 0.77 mg/dL (ref 0.50–1.10)
GFR calc Af Amer: 85 mL/min — ABNORMAL LOW (ref >90)
GFR calc non Af Amer: 73 mL/min — ABNORMAL LOW (ref >90)
Glucose, Bld: 92 mg/dL (ref 70–99)
Potassium: 4.3 mEq/L (ref 3.7–5.3)
Sodium: 147 mEq/L (ref 137–147)

## 2014-02-11 LAB — HEMOGLOBIN AND HEMATOCRIT, BLOOD
HCT: 19.1 % — ABNORMAL LOW (ref 36.0–46.0)
HEMOGLOBIN: 6.3 g/dL — AB (ref 12.0–15.0)

## 2014-02-11 LAB — PREPARE RBC (CROSSMATCH)

## 2014-02-12 LAB — TYPE AND SCREEN
ABO/RH(D): O POS
ANTIBODY SCREEN: NEGATIVE
UNIT DIVISION: 0
Unit division: 0

## 2014-02-12 LAB — CBC WITH DIFFERENTIAL/PLATELET
BASOS ABS: 0 10*3/uL (ref 0.0–0.1)
BASOS PCT: 0 % (ref 0–1)
Eosinophils Absolute: 0.4 10*3/uL (ref 0.0–0.7)
Eosinophils Relative: 4 % (ref 0–5)
HCT: 27.9 % — ABNORMAL LOW (ref 36.0–46.0)
Hemoglobin: 9.2 g/dL — ABNORMAL LOW (ref 12.0–15.0)
Lymphocytes Relative: 10 % — ABNORMAL LOW (ref 12–46)
Lymphs Abs: 0.9 10*3/uL (ref 0.7–4.0)
MCH: 27.3 pg (ref 26.0–34.0)
MCHC: 33 g/dL (ref 30.0–36.0)
MCV: 82.8 fL (ref 78.0–100.0)
Monocytes Absolute: 0.9 10*3/uL (ref 0.1–1.0)
Monocytes Relative: 10 % (ref 3–12)
Neutro Abs: 6.9 10*3/uL (ref 1.7–7.7)
Neutrophils Relative %: 76 % (ref 43–77)
PLATELETS: 307 10*3/uL (ref 150–400)
RBC: 3.37 MIL/uL — ABNORMAL LOW (ref 3.87–5.11)
RDW: 17.1 % — AB (ref 11.5–15.5)
WBC: 9.1 10*3/uL (ref 4.0–10.5)

## 2014-02-12 LAB — BASIC METABOLIC PANEL
ANION GAP: 11 (ref 5–15)
BUN: 41 mg/dL — ABNORMAL HIGH (ref 6–23)
CALCIUM: 8.2 mg/dL — AB (ref 8.4–10.5)
CO2: 26 mEq/L (ref 19–32)
Chloride: 111 mEq/L (ref 96–112)
Creatinine, Ser: 0.78 mg/dL (ref 0.50–1.10)
GFR calc Af Amer: 85 mL/min — ABNORMAL LOW (ref >90)
GFR, EST NON AFRICAN AMERICAN: 73 mL/min — AB (ref >90)
Glucose, Bld: 113 mg/dL — ABNORMAL HIGH (ref 70–99)
Potassium: 4.3 mEq/L (ref 3.7–5.3)
Sodium: 148 mEq/L — ABNORMAL HIGH (ref 137–147)

## 2014-02-13 LAB — BASIC METABOLIC PANEL
ANION GAP: 9 (ref 5–15)
BUN: 42 mg/dL — ABNORMAL HIGH (ref 6–23)
CALCIUM: 8.4 mg/dL (ref 8.4–10.5)
CO2: 26 mEq/L (ref 19–32)
Chloride: 108 mEq/L (ref 96–112)
Creatinine, Ser: 0.74 mg/dL (ref 0.50–1.10)
GFR calc Af Amer: 86 mL/min — ABNORMAL LOW (ref >90)
GFR calc non Af Amer: 74 mL/min — ABNORMAL LOW (ref >90)
GLUCOSE: 106 mg/dL — AB (ref 70–99)
Potassium: 4.2 mEq/L (ref 3.7–5.3)
SODIUM: 143 meq/L (ref 137–147)

## 2014-02-13 LAB — HEMOGLOBIN AND HEMATOCRIT, BLOOD
HCT: 28.7 % — ABNORMAL LOW (ref 36.0–46.0)
HEMOGLOBIN: 9.4 g/dL — AB (ref 12.0–15.0)

## 2014-02-16 LAB — CBC WITH DIFFERENTIAL/PLATELET
BASOS PCT: 0 % (ref 0–1)
Basophils Absolute: 0 10*3/uL (ref 0.0–0.1)
EOS PCT: 4 % (ref 0–5)
Eosinophils Absolute: 0.4 10*3/uL (ref 0.0–0.7)
HCT: 27.3 % — ABNORMAL LOW (ref 36.0–46.0)
Hemoglobin: 9 g/dL — ABNORMAL LOW (ref 12.0–15.0)
Lymphocytes Relative: 8 % — ABNORMAL LOW (ref 12–46)
Lymphs Abs: 0.8 10*3/uL (ref 0.7–4.0)
MCH: 27.4 pg (ref 26.0–34.0)
MCHC: 33 g/dL (ref 30.0–36.0)
MCV: 83.2 fL (ref 78.0–100.0)
MONOS PCT: 15 % — AB (ref 3–12)
Monocytes Absolute: 1.4 10*3/uL — ABNORMAL HIGH (ref 0.1–1.0)
NEUTROS PCT: 73 % (ref 43–77)
Neutro Abs: 6.6 10*3/uL (ref 1.7–7.7)
Platelets: 379 10*3/uL (ref 150–400)
RBC: 3.28 MIL/uL — ABNORMAL LOW (ref 3.87–5.11)
RDW: 17.8 % — ABNORMAL HIGH (ref 11.5–15.5)
WBC: 9.1 10*3/uL (ref 4.0–10.5)

## 2014-02-16 LAB — BASIC METABOLIC PANEL
Anion gap: 10 (ref 5–15)
BUN: 43 mg/dL — AB (ref 6–23)
CALCIUM: 8.1 mg/dL — AB (ref 8.4–10.5)
CO2: 27 mEq/L (ref 19–32)
CREATININE: 0.65 mg/dL (ref 0.50–1.10)
Chloride: 100 mEq/L (ref 96–112)
GFR calc Af Amer: 90 mL/min — ABNORMAL LOW (ref >90)
GFR, EST NON AFRICAN AMERICAN: 78 mL/min — AB (ref >90)
Glucose, Bld: 97 mg/dL (ref 70–99)
POTASSIUM: 5.4 meq/L — AB (ref 3.7–5.3)
Sodium: 137 mEq/L (ref 137–147)

## 2014-02-17 LAB — BASIC METABOLIC PANEL
Anion gap: 9 (ref 5–15)
BUN: 46 mg/dL — ABNORMAL HIGH (ref 6–23)
CALCIUM: 8.3 mg/dL — AB (ref 8.4–10.5)
CO2: 28 meq/L (ref 19–32)
CREATININE: 0.75 mg/dL (ref 0.50–1.10)
Chloride: 103 mEq/L (ref 96–112)
GFR calc Af Amer: 86 mL/min — ABNORMAL LOW (ref >90)
GFR calc non Af Amer: 74 mL/min — ABNORMAL LOW (ref >90)
Glucose, Bld: 100 mg/dL — ABNORMAL HIGH (ref 70–99)
Potassium: 4.5 mEq/L (ref 3.7–5.3)
Sodium: 140 mEq/L (ref 137–147)

## 2014-02-17 LAB — PREALBUMIN: Prealbumin: 11.9 mg/dL — ABNORMAL LOW (ref 17.0–34.0)

## 2014-02-17 LAB — PHOSPHORUS: Phosphorus: 2.8 mg/dL (ref 2.3–4.6)

## 2014-02-17 LAB — MAGNESIUM: Magnesium: 2 mg/dL (ref 1.5–2.5)

## 2014-02-19 LAB — CBC
HEMATOCRIT: 33.2 % — AB (ref 36.0–46.0)
Hemoglobin: 10.8 g/dL — ABNORMAL LOW (ref 12.0–15.0)
MCH: 27.6 pg (ref 26.0–34.0)
MCHC: 32.5 g/dL (ref 30.0–36.0)
MCV: 84.9 fL (ref 78.0–100.0)
Platelets: 369 10*3/uL (ref 150–400)
RBC: 3.91 MIL/uL (ref 3.87–5.11)
RDW: 17.1 % — AB (ref 11.5–15.5)
WBC: 10.2 10*3/uL (ref 4.0–10.5)

## 2014-02-19 LAB — PHOSPHORUS: Phosphorus: 2.8 mg/dL (ref 2.3–4.6)

## 2014-02-19 LAB — BASIC METABOLIC PANEL
Anion gap: 12 (ref 5–15)
BUN: 48 mg/dL — AB (ref 6–23)
CHLORIDE: 99 meq/L (ref 96–112)
CO2: 29 meq/L (ref 19–32)
CREATININE: 0.75 mg/dL (ref 0.50–1.10)
Calcium: 9 mg/dL (ref 8.4–10.5)
GFR calc Af Amer: 86 mL/min — ABNORMAL LOW (ref >90)
GFR calc non Af Amer: 74 mL/min — ABNORMAL LOW (ref >90)
Glucose, Bld: 116 mg/dL — ABNORMAL HIGH (ref 70–99)
Potassium: 5.1 mEq/L (ref 3.7–5.3)
Sodium: 140 mEq/L (ref 137–147)

## 2014-02-19 LAB — MAGNESIUM: Magnesium: 2.1 mg/dL (ref 1.5–2.5)

## 2014-02-19 LAB — C-REACTIVE PROTEIN: CRP: 7.4 mg/dL — ABNORMAL HIGH (ref <0.60)

## 2014-02-19 LAB — SEDIMENTATION RATE: SED RATE: 115 mm/h — AB (ref 0–22)

## 2014-07-05 ENCOUNTER — Emergency Department (HOSPITAL_COMMUNITY): Payer: Medicare Other

## 2014-07-05 ENCOUNTER — Inpatient Hospital Stay (HOSPITAL_COMMUNITY)
Admission: EM | Admit: 2014-07-05 | Discharge: 2014-07-08 | DRG: 689 | Disposition: A | Discharge status: Skilled Nursing Facility | Payer: Medicare Other | Attending: Internal Medicine | Admitting: Internal Medicine

## 2014-07-05 ENCOUNTER — Encounter (HOSPITAL_COMMUNITY): Payer: Self-pay

## 2014-07-05 DIAGNOSIS — N182 Chronic kidney disease, stage 2 (mild): Secondary | ICD-10-CM | POA: Diagnosis present

## 2014-07-05 DIAGNOSIS — D649 Anemia, unspecified: Secondary | ICD-10-CM | POA: Diagnosis present

## 2014-07-05 DIAGNOSIS — N390 Urinary tract infection, site not specified: Secondary | ICD-10-CM | POA: Diagnosis present

## 2014-07-05 DIAGNOSIS — E43 Unspecified severe protein-calorie malnutrition: Secondary | ICD-10-CM | POA: Diagnosis present

## 2014-07-05 DIAGNOSIS — L89524 Pressure ulcer of left ankle, stage 4: Secondary | ICD-10-CM | POA: Diagnosis present

## 2014-07-05 DIAGNOSIS — E870 Hyperosmolality and hypernatremia: Secondary | ICD-10-CM | POA: Diagnosis present

## 2014-07-05 DIAGNOSIS — N179 Acute kidney failure, unspecified: Secondary | ICD-10-CM | POA: Diagnosis present

## 2014-07-05 DIAGNOSIS — Z931 Gastrostomy status: Secondary | ICD-10-CM

## 2014-07-05 DIAGNOSIS — L89154 Pressure ulcer of sacral region, stage 4: Secondary | ICD-10-CM | POA: Diagnosis present

## 2014-07-05 DIAGNOSIS — F0280 Dementia in other diseases classified elsewhere without behavioral disturbance: Secondary | ICD-10-CM | POA: Diagnosis present

## 2014-07-05 DIAGNOSIS — G309 Alzheimer's disease, unspecified: Secondary | ICD-10-CM | POA: Diagnosis present

## 2014-07-05 DIAGNOSIS — Z79899 Other long term (current) drug therapy: Secondary | ICD-10-CM | POA: Diagnosis not present

## 2014-07-05 DIAGNOSIS — L89510 Pressure ulcer of right ankle, unstageable: Secondary | ICD-10-CM | POA: Diagnosis present

## 2014-07-05 DIAGNOSIS — Z6824 Body mass index (BMI) 24.0-24.9, adult: Secondary | ICD-10-CM | POA: Diagnosis not present

## 2014-07-05 DIAGNOSIS — I129 Hypertensive chronic kidney disease with stage 1 through stage 4 chronic kidney disease, or unspecified chronic kidney disease: Secondary | ICD-10-CM | POA: Diagnosis present

## 2014-07-05 DIAGNOSIS — R509 Fever, unspecified: Secondary | ICD-10-CM | POA: Insufficient documentation

## 2014-07-05 DIAGNOSIS — Z5189 Encounter for other specified aftercare: Secondary | ICD-10-CM

## 2014-07-05 LAB — BASIC METABOLIC PANEL
Anion gap: 10 (ref 5–15)
Anion gap: 8 (ref 5–15)
BUN: 63 mg/dL — ABNORMAL HIGH (ref 6–23)
BUN: 62 mg/dL — ABNORMAL HIGH (ref 6–23)
CALCIUM: 8.7 mg/dL (ref 8.4–10.5)
CALCIUM: 9 mg/dL (ref 8.4–10.5)
CO2: 26 mEq/L (ref 19–32)
CO2: 30 mEq/L (ref 19–32)
Chloride: 116 mEq/L — ABNORMAL HIGH (ref 96–112)
Chloride: 117 mEq/L — ABNORMAL HIGH (ref 96–112)
Creatinine, Ser: 1.12 mg/dL — ABNORMAL HIGH (ref 0.50–1.10)
Creatinine, Ser: 1.14 mg/dL — ABNORMAL HIGH (ref 0.50–1.10)
GFR, EST AFRICAN AMERICAN: 50 mL/min — AB (ref >90)
GFR, EST AFRICAN AMERICAN: 49 mL/min — AB (ref >90)
GFR, EST NON AFRICAN AMERICAN: 43 mL/min — AB (ref >90)
GFR, EST NON AFRICAN AMERICAN: 42 mL/min — AB (ref >90)
Glucose, Bld: 119 mg/dL — ABNORMAL HIGH (ref 70–99)
Glucose, Bld: 114 mg/dL — ABNORMAL HIGH (ref 70–99)
POTASSIUM: 4.4 meq/L (ref 3.7–5.3)
Potassium: 3.9 mEq/L (ref 3.7–5.3)
SODIUM: 152 meq/L — AB (ref 137–147)
SODIUM: 155 meq/L — AB (ref 137–147)

## 2014-07-05 LAB — URINE MICROSCOPIC-ADD ON

## 2014-07-05 LAB — CBG MONITORING, ED: Glucose-Capillary: 80 mg/dL (ref 70–99)

## 2014-07-05 LAB — COMPREHENSIVE METABOLIC PANEL
ALBUMIN: 2.4 g/dL — AB (ref 3.5–5.2)
ALK PHOS: 97 U/L (ref 39–117)
ALT: 27 U/L (ref 0–35)
ANION GAP: 7 (ref 5–15)
AST: 37 U/L (ref 0–37)
BILIRUBIN TOTAL: 0.3 mg/dL (ref 0.3–1.2)
BUN: 73 mg/dL — AB (ref 6–23)
CO2: 30 mEq/L (ref 19–32)
Calcium: 9.1 mg/dL (ref 8.4–10.5)
Chloride: 116 mEq/L — ABNORMAL HIGH (ref 96–112)
Creatinine, Ser: 1.26 mg/dL — ABNORMAL HIGH (ref 0.50–1.10)
GFR calc Af Amer: 43 mL/min — ABNORMAL LOW (ref >90)
GFR calc non Af Amer: 37 mL/min — ABNORMAL LOW (ref >90)
GLUCOSE: 89 mg/dL (ref 70–99)
POTASSIUM: 4.9 meq/L (ref 3.7–5.3)
Sodium: 153 mEq/L — ABNORMAL HIGH (ref 137–147)
Total Protein: 7.8 g/dL (ref 6.0–8.3)

## 2014-07-05 LAB — URINALYSIS, ROUTINE W REFLEX MICROSCOPIC
Bilirubin Urine: NEGATIVE
Glucose, UA: NEGATIVE mg/dL
KETONES UR: NEGATIVE mg/dL
NITRITE: NEGATIVE
PH: 8.5 — AB (ref 5.0–8.0)
PROTEIN: NEGATIVE mg/dL
Specific Gravity, Urine: 1.014 (ref 1.005–1.030)
UROBILINOGEN UA: 0.2 mg/dL (ref 0.0–1.0)

## 2014-07-05 LAB — CBC WITH DIFFERENTIAL/PLATELET
BASOS PCT: 1 % (ref 0–1)
Basophils Absolute: 0.1 10*3/uL (ref 0.0–0.1)
Eosinophils Absolute: 0.2 10*3/uL (ref 0.0–0.7)
Eosinophils Relative: 2 % (ref 0–5)
HCT: 32.6 % — ABNORMAL LOW (ref 36.0–46.0)
HEMOGLOBIN: 9.6 g/dL — AB (ref 12.0–15.0)
LYMPHS ABS: 1.2 10*3/uL (ref 0.7–4.0)
Lymphocytes Relative: 13 % (ref 12–46)
MCH: 23.2 pg — ABNORMAL LOW (ref 26.0–34.0)
MCHC: 29.4 g/dL — ABNORMAL LOW (ref 30.0–36.0)
MCV: 78.9 fL (ref 78.0–100.0)
MONOS PCT: 9 % (ref 3–12)
Monocytes Absolute: 0.8 10*3/uL (ref 0.1–1.0)
NEUTROS PCT: 75 % (ref 43–77)
Neutro Abs: 6.6 10*3/uL (ref 1.7–7.7)
Platelets: 256 10*3/uL (ref 150–400)
RBC: 4.13 MIL/uL (ref 3.87–5.11)
RDW: 16.4 % — ABNORMAL HIGH (ref 11.5–15.5)
WBC: 8.7 10*3/uL (ref 4.0–10.5)

## 2014-07-05 LAB — LACTIC ACID, PLASMA: Lactic Acid, Venous: 0.9 mmol/L (ref 0.5–2.2)

## 2014-07-05 LAB — TROPONIN I: Troponin I: <0.3 ng/mL (ref <0.30)

## 2014-07-05 LAB — MRSA PCR SCREENING: MRSA by PCR: POSITIVE — AB

## 2014-07-05 MED ORDER — VITAL HIGH PROTEIN PO LIQD: 1000.0000 mL | ORAL | Status: DC

## 2014-07-05 MED ORDER — ACETAMINOPHEN 325 MG PO TABS: 650.0000 mg | ORAL_TABLET | Freq: Four times a day (QID) | ORAL | Status: DC | PRN

## 2014-07-05 MED ORDER — CETYLPYRIDINIUM CHLORIDE 0.05 % MT LIQD
7.0000 mL | Freq: Two times a day (BID) | OROMUCOSAL | Status: DC
  Administered 2014-07-05 – 2014-07-07 (×5): 7 mL via OROMUCOSAL

## 2014-07-05 MED ORDER — FERROUS SULFATE 220 (44 FE) MG/5ML PO ELIX
330.0000 mg | ORAL_SOLUTION | Freq: Two times a day (BID) | ORAL | Status: DC
  Filled 2014-07-05 (×2): qty 7.5

## 2014-07-05 MED ORDER — VANCOMYCIN HCL IN DEXTROSE 750-5 MG/150ML-% IV SOLN
750.0000 mg | INTRAVENOUS | Status: DC
  Administered 2014-07-06 – 2014-07-07 (×2): 750 mg via INTRAVENOUS
  Filled 2014-07-05 (×2): qty 150

## 2014-07-05 MED ORDER — BACLOFEN 10 MG PO TABS
10.0000 mg | ORAL_TABLET | Freq: Three times a day (TID) | ORAL | Status: DC
  Administered 2014-07-05 – 2014-07-08 (×10): 10 mg
  Filled 2014-07-05 (×11): qty 1

## 2014-07-05 MED ORDER — PIPERACILLIN-TAZOBACTAM 3.375 G IVPB
3.3750 g | Freq: Three times a day (TID) | INTRAVENOUS | Status: DC
  Administered 2014-07-05 – 2014-07-07 (×5): 3.375 g via INTRAVENOUS
  Filled 2014-07-05 (×7): qty 50

## 2014-07-05 MED ORDER — FAMOTIDINE 20 MG PO TABS
20.0000 mg | ORAL_TABLET | Freq: Two times a day (BID) | ORAL | Status: DC
  Administered 2014-07-05 – 2014-07-08 (×6): 20 mg
  Filled 2014-07-05 (×7): qty 1

## 2014-07-05 MED ORDER — FERROUS SULFATE 300 (60 FE) MG/5ML PO SYRP
300.0000 mg | ORAL_SOLUTION | Freq: Two times a day (BID) | ORAL | Status: DC
  Administered 2014-07-05 – 2014-07-08 (×7): 300 mg
  Filled 2014-07-05 (×8): qty 5

## 2014-07-05 MED ORDER — VITAMIN C 500 MG PO TABS
500.0000 mg | ORAL_TABLET | Freq: Every day | ORAL | Status: DC
  Administered 2014-07-05 – 2014-07-08 (×4): 500 mg
  Filled 2014-07-05 (×4): qty 1

## 2014-07-05 MED ORDER — SODIUM CHLORIDE 0.45 % IV SOLN
INTRAVENOUS | Status: DC
  Administered 2014-07-05: 12:00:00 via INTRAVENOUS

## 2014-07-05 MED ORDER — SODIUM CHLORIDE 0.9 % IJ SOLN: 3.0000 mL | INTRAMUSCULAR | Status: DC | PRN

## 2014-07-05 MED ORDER — HEPARIN SODIUM (PORCINE) 5000 UNIT/ML IJ SOLN
5000.0000 [IU] | Freq: Three times a day (TID) | INTRAMUSCULAR | Status: DC
  Administered 2014-07-05 – 2014-07-08 (×9): 5000 [IU] via SUBCUTANEOUS
  Filled 2014-07-05 (×10): qty 1

## 2014-07-05 MED ORDER — PIPERACILLIN-TAZOBACTAM 3.375 G IVPB 30 MIN
3.3750 g | Freq: Once | INTRAVENOUS | Status: AC
  Administered 2014-07-05: 3.375 g via INTRAVENOUS
  Filled 2014-07-05: qty 50

## 2014-07-05 MED ORDER — ACETAMINOPHEN 650 MG RE SUPP: 650.0000 mg | Freq: Four times a day (QID) | RECTAL | Status: DC | PRN

## 2014-07-05 MED ORDER — JEVITY 1.2 CAL PO LIQD
330.0000 mL | Freq: Four times a day (QID) | ORAL | Status: DC
  Administered 2014-07-05 – 2014-07-07 (×9): 330 mL
  Filled 2014-07-05 (×18): qty 474

## 2014-07-05 MED ORDER — FREE WATER
225.0000 mL | Freq: Four times a day (QID) | Status: DC
  Administered 2014-07-05 – 2014-07-08 (×13): 225 mL

## 2014-07-05 MED ORDER — VANCOMYCIN HCL IN DEXTROSE 1-5 GM/200ML-% IV SOLN
1000.0000 mg | Freq: Once | INTRAVENOUS | Status: AC
  Administered 2014-07-05: 1000 mg via INTRAVENOUS
  Filled 2014-07-05: qty 200

## 2014-07-05 MED ORDER — DEXTROSE 5 % IV SOLN
INTRAVENOUS | Status: DC
  Administered 2014-07-05 – 2014-07-06 (×2): 1000 mL via INTRAVENOUS
  Administered 2014-07-06 – 2014-07-07 (×2): via INTRAVENOUS

## 2014-07-05 NOTE — ED Notes (Addendum)
PTAR- [facility] is there for wound care. Pt has sacral wound that is Stage 4, has been running fever since last night. CBG of 70 with EMS, BP of 140 palpated. Pt has hx of dementia and is non-verbal, pt also DNR. Foley catheter and feeding tube present on arrival. Pt has mittens present on arrival to keep pt from touching wound.

## 2014-07-05 NOTE — ED Notes (Signed)
Pt transported to xray 

## 2014-07-05 NOTE — Progress Notes (Signed)
Pt arrived to unit via stretcher non verbal, alert. Will monitor pt per orders.

## 2014-07-05 NOTE — H&P (Signed)
Date: 07/05/2014               Patient Name:  Michelle Hooper MRN: [id number]  DOB: 1924-10-13 Age / Sex: 80 y.o., female   PCP: Jackie PlumGeorge [name], MD         Medical Service: Internal Medicine Teaching Service         Attending Physician: Dr. Debe CoderEmily Mullen, MD    First Contact: Dr. Isabella BowensKrall Pager: [phone]  Second Contact: Dr. Andrey CampanileWilson Pager: [phone]       After Hours (After 5p/  First Contact Pager: [phone]  weekends / holidays): Second Contact Pager: [phone]   Chief Complaint: Fever  History of Present Illness: Ms. Michelle Hooper is an 80 year old woman with history of HTN, Alzheimer's dementia, iron deficiency anemia, stage IV sacral decubitus ulcer, frequent UTI with chronic indwelling catheter, dysphagia s/p g tube presenting with fever. [facility] Pt is nonverbal. Per ED note and RN Michelle Hooper, the patient had a fever to 101.8 this morning. She was given Tylenol 650mg  x 1. UA from her chronic indwelling foley on 12/18 had small leuk esterase, rare bacteria, WBC 3-6 and she was started on cipro 500mg  BID. She has ulcers on bilateral lateral ankles, and L lateral mid foot.  Meds: Current Facility-Administered Medications  Medication Dose Route Frequency Provider Last Rate Last Dose  . 0.45 % sodium chloride infusion   Intravenous Continuous Heywood Ilesushil Patel, MD      . piperacillin-tazobactam (ZOSYN) IVPB 3.375 g  3.375 g Intravenous 3 times per day Chinita GreenlandNita Faye Johnston, [facility]      . vancomycin (VANCOCIN) IVPB 1000 mg/200 mL premix  1,000 mg Intravenous Once Heywood Ilesushil Patel, MD      . Melene Muller[START ON 07/06/2014] vancomycin (VANCOCIN) IVPB 750 mg/150 ml premix  750 mg Intravenous Q24H Nita Altamese CarolinaFaye Johnston, [facility]       Current Outpatient Prescriptions  Medication Sig Dispense Refill  . amLODipine (NORVASC) 10 MG tablet Take 1 tablet (10 mg total) by mouth daily. 30 tablet 3  . baclofen (LIORESAL) 10 MG tablet Give 10 mg by tube 3 (three) times daily.    .  famotidine (PEPCID) 20 MG tablet Give 20 mg by tube 2 (two) times daily.    . ferrous sulfate 220 (44 FE) MG/5ML solution Place 330 mg into feeding tube 2 (two) times daily with a meal.    . lactobacillus acidophilus (BACID) TABS tablet Place 1 tablet into feeding tube 2 (two) times daily.    . metoprolol (LOPRESSOR) 50 MG tablet Take 75 mg by mouth 2 (two) times daily.    Marland Kitchen. oxyCODONE (OXY IR/ROXICODONE) 5 MG immediate release tablet Give 5 mg by tube every 6 (six) hours as needed for severe pain.    . vitamin C (ASCORBIC ACID) 500 MG tablet Give 500 mg by tube daily.    Marland Kitchen. antiseptic oral rinse (BIOTENE) LIQD 15 mLs by Mouth Rinse route QID. (Patient not taking: Reported on 07/05/2014)    . bisacodyl (DULCOLAX) 10 MG suppository Place 1 suppository (10 mg total) rectally daily as needed for moderate constipation. 12 suppository 0  . chlorhexidine (PERIDEX) 0.12 % solution 15 mLs by Mouth Rinse route 2 (two) times daily. (Patient not taking: Reported on 07/05/2014) 120 mL 0  . cloNIDine (CATAPRES - DOSED IN MG/24 HR) 0.2 mg/24hr patch Place 1 patch (0.2 mg total) onto the skin once a week. (Patient not taking: Reported on 07/05/2014) 4 patch 12  . dextrose  5 % solution Infuse at 50 cc/hr (Patient not taking: Reported on 07/05/2014) 50 mL   . enoxaparin (LOVENOX) 30 MG/0.3ML injection Inject 0.3 mLs (30 mg total) into the skin daily. (Patient not taking: Reported on 07/05/2014) 0 Syringe   . fentaNYL (SUBLIMAZE) 0.05 MG/ML injection Inject 0.25-0.5 mLs (12.5-25 mcg total) into the vein every 2 (two) hours as needed for severe pain. (Patient not taking: Reported on 07/05/2014) 2 mL 0  . hydrALAZINE (APRESOLINE) 50 MG tablet Take 1 tablet (50 mg total) by mouth every 8 (eight) hours. (Patient not taking: Reported on 07/05/2014) 90 tablet 7  . Multiple Vitamin (MULTI VITAMIN DAILY PO) Take 15 mLs by mouth daily.    . piperacillin-tazobactam (ZOSYN) 3.375 GM/50ML IVPB Inject 50 mLs (3.375 g total) into the  vein every 8 (eight) hours. (Patient not taking: Reported on 07/05/2014) 50 mL   . sodium chloride 0.9 % injection Inject 3 mLs into the vein every 12 (twelve) hours. (Patient not taking: Reported on 07/05/2014) 5 mL 0    Allergies: Allergies as of 07/05/2014  . (No Known Allergies)   Past Medical History  Diagnosis Date  . SBO (small bowel obstruction)   . Hypertension   . Alzheimer's dementia   . Iron deficiency anemia   . Frequent UTI    Past Surgical History  Procedure Laterality Date  . Exploratory laparotomy, lysis of adhesions, small bowel  07/30/10  . Irrigation and debridement abscess N/A 01/21/2014    Procedure: IRRIGATION AND DEBRIDEMENT Sacral Decubitus Ulcer;  Surgeon: Axel Filler, MD;  Location: [facility] OR;  Service: General;  Laterality: N/A;   Family History  Problem Relation Age of Onset  . Cancer Son     Colon cancer  . Diabetes Son   . Diabetes Sister   . Hypertension Sister   . Stroke Sister    History   Social History  . Marital Status: Widowed    Spouse Name: N/A    Number of Children: 5  . Years of Education: N/A   Occupational History  . Retired Librarian, academic    Social History Main Topics  . Smoking status: Never Smoker   . Smokeless tobacco: Never Used  . Alcohol Use: No  . Drug Use: Not on file  . Sexual Activity: No   Other Topics Concern  . Not on file   Social History Narrative   Widowed.  Lives with daughter.  Non-ambulatory x several months.    Review of Systems: Unable to obtain 2/2 nonverbal patient  Physical Exam: Blood pressure 122/54, pulse 70, temperature 98 F (36.7 C), temperature source Rectal, resp. rate 18, SpO2 100 %. General Apperance: NAD Head: Normocephalic, atraumatic Eyes: PERRL, anicteric sclera Ears: Normal external ear canal Nose: Nares normal, septum midline, mucosa normal Throat: Lips, mucosa and tongue normal  Neck: Supple, trachea midline Back: No  tenderness or bony abnormality  Lungs: Clear to auscultation bilaterally. No wheezes, rhonchi or rales. Breathing comfortably Chest Wall: Nontender, no deformity Heart: Regular rate and rhythm, +systolic murmur, no rub/gallop Abdomen: Soft, nontender, nondistended, no rebound/guarding, +G tube without surrounding erythema Extremities: Warm and well perfused, no edema Pulses: 2+ throughout Skin: Large sacral ulcer with clean bases, ulcers along left and right lateral feet bilaterally with dressing in place with serosanguinous strikethrough. No erythema or purulent drainage. Neurologic: Alert. Nonverbal. Unable to follow commands. Moving extremities spontaneously.  Lab results: Basic Metabolic Panel:  Recent Labs  16/10/96 1012  NA 153*  K 4.9  CL 116*  CO2 30  GLUCOSE 89  BUN 73*  CREATININE 1.26*  CALCIUM 9.1   Liver Function Tests:  Recent Labs  07/05/14 1012  AST 37  ALT 27  ALKPHOS 97  BILITOT 0.3  PROT 7.8  ALBUMIN 2.4*   CBC:  Recent Labs  07/05/14 1012  WBC 8.7  NEUTROABS 6.6  HGB 9.6*  HCT 32.6*  MCV 78.9  PLT 256   Cardiac Enzymes:  Recent Labs  07/05/14 1327  TROPONINI <0.30   CBG:  Recent Labs  07/05/14 0827  GLUCAP 80   Urinalysis:  Recent Labs  07/05/14 1129  COLORURINE YELLOW  LABSPEC 1.014  PHURINE 8.5*  GLUCOSEU NEGATIVE  HGBUR SMALL*  BILIRUBINUR NEGATIVE  KETONESUR NEGATIVE  PROTEINUR NEGATIVE  UROBILINOGEN 0.2  NITRITE NEGATIVE  LEUKOCYTESUR LARGE*  WBC 21-50 Few Bacteria  Imaging results:  Dg Chest 1 View  07/05/2014   CLINICAL DATA:  Stage IV sacral wound.  Fever  EXAM: CHEST - 1 VIEW  COMPARISON:  01/30/2014  FINDINGS: There is elevation of the left diaphragm. There is bilateral mild interstitial prominence. There is no focal parenchymal opacity, pleural effusion, or pneumothorax. There is stable cardiomegaly. There is thoracic aortic atherosclerosis.  The osseous structures are unremarkable.  IMPRESSION: 1. No  acute cardiopulmonary disease. 2. Stable cardiomegaly.   Electronically Signed   By: Elige KoHetal  Patel   On: 07/05/2014 09:39    Other results: EKG: Normal sinus rhythm, old T wave inversion in I, aVL. ST elevation in V3, V4 seen previously.   Assessment & Plan by Problem: Active Problems:   Hypernatremia  Fever: Had a fever to 101.8 prior to arrival. UA from her chronic indwelling foley on 12/17 had small leuk esterase, rare bacteria, WBC 3-6. Here her T max was 99.9. No tachycardia. No leukocytosis. Does not meet SIRS criteria. Ulcers appear clean and without signs of infection. CXR with no acute cardiopulmonary disease. UA with large leukocytes, negative nitrite, 21-50 WBC, few bacteria. -f/u BCx x2, UCx -Vancomycin 1gm x 1, then Vancomycin 750mg  every 24 hr -Continue IV Zosyn 3.375 gm every 8 hours -Wound care consult  Hypernatremia: Receives 225ml free water QID via G tube. May be 2/2 inadequate free water. Sodium 153. Will correct max 10 meq/L per day - 1.6L free water deficit. -Nutrition consult -D5W @ 65cc/hr -Continue home water flush 225ml QID -Check BMP Q4hr  AKI on CKD stage 2: Cr 1.26 at admission. Baseline 0.75. -D5W @ 65cc/hr -Continue home water flush 225ml QID -Continue to monitor  Hypertension: On metoprolol 75mg  BID and amlodipine 10mg  daily. Normotensive presently. -Hold home metoprolol 75mg  BID and amlodpine 10mg  daily.  Chronic anemia: Hgb 9.6 with normal MCV 78.9. Baseline 9-10 -Continue home Ferrous sulfate 330mg  BID  Chronic wounds: -Wound care consult -Continue home Baclofen 10mg  TID -Hold home oxycodone 5mg  Q6hr prn pain for now -Continue home Vitamin C 500mg  daily  FEN:  -NPO -Jevity 1.2 330mL QID per g tube -Water flush 225mL QID per g tube -Continue home Pepcid 20mg  BID  VTE ppx: subq hep 5000u QID  Dispo: Disposition is deferred at this time, awaiting improvement of current medical problems. Anticipated discharge in approximately 1-2 day(s).    The patient does have a current PCP Jackie Plum([name], MD) and [facility] not [facility] after discharge.  The patient does not have transportation limitations that hinder transportation to clinic appointments.  Signed: Griffin BasilJennifer Krall, MD 07/05/2014, 12:11  PM

## 2014-07-05 NOTE — ED Provider Notes (Signed)
CSN: [id number]     Arrival date & time 07/05/14  [id number] History   First MD Initiated Contact with Patient 07/05/14 [phone]     Chief Complaint  Patient presents with  . Wound Check    HPI  [name] is an 80 year old female with Stage IV sacral decubitus ulcer who presents with a fever from SNF. As the patient is non-verbal, history was collected from [facility]. This AM, the patient had T101.65F and was given Tylenol 650mg  x 1. She has a chronic indwelling Foley and PEG tube and was being treated with cipro 500mg  BID since 12/17; UA from that time showed small LE, rare bacteria, WBC 3-6. She also has multiple other ulcers including lateral ankles bilaterally and L lateral mid foot. She is DNR though does not have a MOST form on file.    Past Medical History  Diagnosis Date  . SBO (small bowel obstruction)   . Hypertension   . Alzheimer's dementia   . Iron deficiency anemia   . Frequent UTI    Past Surgical History  Procedure Laterality Date  . Exploratory laparotomy, lysis of adhesions, small bowel  07/30/10  . Irrigation and debridement abscess N/A 01/21/2014    Procedure: IRRIGATION AND DEBRIDEMENT Sacral Decubitus Ulcer;  Surgeon: Axel FillerArmando Ramirez, MD;  Location: [facility] OR;  Service: General;  Laterality: N/A;   Family History  Problem Relation Age of Onset  . Cancer Son     Colon cancer  . Diabetes Son   . Diabetes Sister   . Hypertension Sister   . Stroke Sister    History  Substance Use Topics  . Smoking status: Never Smoker   . Smokeless tobacco: Never Used  . Alcohol Use: No   OB History    No data available     Review of Systems  Unable to perform ROS: Patient nonverbal      Allergies  Review of patient's allergies indicates no known allergies.  Home Medications   Prior to Admission medications   Medication Sig Start Date End Date Taking? Authorizing Provider  amLODipine (NORVASC) 10 MG tablet Take 1 tablet (10 mg total) by mouth daily. 09/22/13  Yes Dorothea OgleIskra  M Myers, MD  baclofen (LIORESAL) 10 MG tablet Give 10 mg by tube 3 (three) times daily.   Yes Historical Provider, MD  famotidine (PEPCID) 20 MG tablet Give 20 mg by tube 2 (two) times daily.   Yes Historical Provider, MD  ferrous sulfate 220 (44 FE) MG/5ML solution Place 330 mg into feeding tube 2 (two) times daily with a meal.   Yes Historical Provider, MD  lactobacillus acidophilus (BACID) TABS tablet Place 1 tablet into feeding tube 2 (two) times daily.   Yes Historical Provider, MD  metoprolol (LOPRESSOR) 50 MG tablet Take 75 mg by mouth 2 (two) times daily.   Yes Historical Provider, MD  oxyCODONE (OXY IR/ROXICODONE) 5 MG immediate release tablet Give 5 mg by tube every 6 (six) hours as needed for severe pain.   Yes Historical Provider, MD  vitamin C (ASCORBIC ACID) 500 MG tablet Give 500 mg by tube daily.   Yes Historical Provider, MD  antiseptic oral rinse (BIOTENE) LIQD 15 mLs by Mouth Rinse route QID. Patient not taking: Reported on 07/05/2014 01/27/14   Russella DarAllison L Ellis, NP  bisacodyl (DULCOLAX) 10 MG suppository Place 1 suppository (10 mg total) rectally daily as needed for moderate constipation. 01/27/14   Russella DarAllison L Ellis, NP  chlorhexidine (PERIDEX) 0.12 % solution  15 mLs by Mouth Rinse route 2 (two) times daily. Patient not taking: Reported on 07/05/2014 01/27/14   Russella DarAllison L Ellis, NP  cloNIDine (CATAPRES - DOSED IN MG/24 HR) 0.2 mg/24hr patch Place 1 patch (0.2 mg total) onto the skin once a week. Patient not taking: Reported on 07/05/2014 09/22/13   Dorothea OgleIskra M Myers, MD  dextrose 5 % solution Infuse at 50 cc/hr Patient not taking: Reported on 07/05/2014 01/27/14   Russella DarAllison L Ellis, NP  enoxaparin (LOVENOX) 30 MG/0.3ML injection Inject 0.3 mLs (30 mg total) into the skin daily. Patient not taking: Reported on 07/05/2014 01/27/14   Russella DarAllison L Ellis, NP  fentaNYL (SUBLIMAZE) 0.05 MG/ML injection Inject 0.25-0.5 mLs (12.5-25 mcg total) into the vein every 2 (two) hours as needed for severe  pain. Patient not taking: Reported on 07/05/2014 01/27/14   Russella DarAllison L Ellis, NP  hydrALAZINE (APRESOLINE) 50 MG tablet Take 1 tablet (50 mg total) by mouth every 8 (eight) hours. Patient not taking: Reported on 07/05/2014 09/22/13   Dorothea OgleIskra M Myers, MD  Multiple Vitamin (MULTI VITAMIN DAILY PO) Take 15 mLs by mouth daily.    Historical Provider, MD  piperacillin-tazobactam (ZOSYN) 3.375 GM/50ML IVPB Inject 50 mLs (3.375 g total) into the vein every 8 (eight) hours. Patient not taking: Reported on 07/05/2014 01/27/14   Russella DarAllison L Ellis, NP  sodium chloride 0.9 % injection Inject 3 mLs into the vein every 12 (twelve) hours. Patient not taking: Reported on 07/05/2014 01/27/14   Russella DarAllison L Ellis, NP   BP 122/54 mmHg  Pulse 70  Temp(Src) 99.9 F (37.7 C) (Rectal)  Resp 18  SpO2 100% Physical Exam  Constitutional: No distress.  Cachectic, frail elderly woman resting in bed  HENT:  Head: Normocephalic and atraumatic.  Eyes: Conjunctivae are normal. Pupils are equal, round, and reactive to light.  Cardiovascular: Normal rate and regular rhythm.  Exam reveals no gallop and no friction rub.   Murmur heard. Systolic murmur, best appreciated at RUSB.  Pulmonary/Chest: Effort normal and breath sounds normal. No respiratory distress. She has no wheezes. She has no rales.  Abdominal: Soft. Bowel sounds are normal. She exhibits no distension. There is no tenderness. There is no rebound.  Neurological: She is alert.  Resting comfortably  Skin: She is not diaphoretic.  Sacral ulcer bandaged, moist-appearing, clean, intact without drainage. Ulcers also present along left lateral foot bilaterally bandaged as well.     ED Course  Procedures (including critical care time) Labs Review Labs Reviewed  CBC WITH DIFFERENTIAL - Abnormal; Notable for the following:    Hemoglobin 9.6 (*)    HCT 32.6 (*)    MCH 23.2 (*)    MCHC 29.4 (*)    RDW 16.4 (*)    All other components within normal limits  COMPREHENSIVE  METABOLIC PANEL - Abnormal; Notable for the following:    Sodium 153 (*)    Chloride 116 (*)    BUN 73 (*)    Creatinine, Ser 1.26 (*)    Albumin 2.4 (*)    GFR calc non Af Amer 37 (*)    GFR calc Af Amer 43 (*)    All other components within normal limits  CULTURE, BLOOD (ROUTINE X 2)  CULTURE, BLOOD (ROUTINE X 2)  URINE CULTURE  LACTIC ACID, PLASMA  URINALYSIS, ROUTINE W REFLEX MICROSCOPIC  CBG MONITORING, ED    Imaging Review Dg Chest 1 View  07/05/2014   CLINICAL DATA:  Stage IV sacral wound.  Fever  EXAM:  CHEST - 1 VIEW  COMPARISON:  01/30/2014  FINDINGS: There is elevation of the left diaphragm. There is bilateral mild interstitial prominence. There is no focal parenchymal opacity, pleural effusion, or pneumothorax. There is stable cardiomegaly. There is thoracic aortic atherosclerosis.  The osseous structures are unremarkable.  IMPRESSION: 1. No acute cardiopulmonary disease. 2. Stable cardiomegaly.   Electronically Signed   By: Elige Ko   On: 07/05/2014 09:39     EKG Interpretation None      MDM   Final diagnoses:  Fever of undetermined origin  Acute hypernatremia  Acute kidney injury   Fever raises the concern, especially since she has multiple sources of possible infection including Foley, ulcers, and overall deconditioning. Plan to check BMET, CBC, urine culture, blood culture and start empiric broad-spectrum antibiotics.   1132AM: Na 153, up from 140 back at last admission; BUN/Crt ratio elevated as well. Free water deficit 2.1L. With 1/4 NS, change in Na/L is ; goal is to correct to just 140 (-13 mEq). Will start 1/4 NS @ 50cc/hr x 7 hours and plan to admit. CBC, lactate, CXR otherwise unremarkable.   1205PM: Spoke with Dr. Evelena Peat ([facility]) and confirmed admission.  Heywood Iles, MD 07/05/14 1206  Gerhard Munch, MD 07/05/14 [phone]

## 2014-07-05 NOTE — Plan of Care (Signed)
Problem: Phase I Progression Outcomes Goal: Voiding-avoid urinary catheter unless indicated Outcome: Not Applicable Date Met:  14/15/97 Pt has foley

## 2014-07-05 NOTE — Progress Notes (Signed)
ANTIBIOTIC CONSULT NOTE - INITIAL  Pharmacy Consult for Vancomycin/Zosyn Indication:  Increased temp, presence of sacral wound with drainage  No Known Allergies  Patient Measurements: Height:  4'11" Weight:  105 lb (estimated)  Vital Signs: Temp: 99.9 F (37.7 C) (12/19 0820) Temp Source: Rectal (12/19 0820) BP: 115/62 mmHg (12/19 0820) Pulse Rate: 70 (12/19 0820)  Medical History: Past Medical History  Diagnosis Date  . SBO (small bowel obstruction)   . Hypertension   . Alzheimer's dementia   . Iron deficiency anemia   . Frequent UTI     Medications:  Anti-infectives    Start     Dose/Rate Route Frequency Ordered Stop   07/05/14 0915  piperacillin-tazobactam (ZOSYN) IVPB 3.375 g     3.375 g100 mL/hr over 30 Minutes Intravenous  Once 07/05/14 0900     07/05/14 0915  vancomycin (VANCOCIN) IVPB 1000 mg/200 mL premix     1,000 mg200 mL/hr over 60 Minutes Intravenous  Once 07/05/14 0900       Assessment: 80yo female with multiple medical problems presents with fever and stage IV sacral decubitus ulcer.  We have been asked to dose her IV antibiotics.  She has been ordered Vancomycin 1gm x 1 and Zosyn 3.375gm IV x 1.  Her creatinine is 0.75 but renal fxn. not truly reflective due to her lean body mass (weight of 47kg).  She tolerated dosing of 750mg  every 24 hours back in July of this year.  She has a WBC of 10.2K and a CRP of 7.4  Goal of Therapy:  Vancomycin trough level 15-20 mcg/ml  Plan:  1.  After initial dose of Vancomycin 1gm, will begin Vancomycin 750mg  every 24 hr. 2.  Continue IV Zosyn at 3.375 gm every 8 hours 3.  Follow renal fxn. with UOP to ensure appropriate clearance 4.  Obtain Vancomycin levels as needed  Nadara MustardNita Tahiri Shareef, PharmD., MS Clinical Pharmacist Pager:  [phone] Thank you for allowing pharmacy to be part of this patients care team. 07/05/2014,9:30 AM

## 2014-07-06 DIAGNOSIS — E870 Hyperosmolality and hypernatremia: Secondary | ICD-10-CM | POA: Insufficient documentation

## 2014-07-06 DIAGNOSIS — E649 Sequelae of unspecified nutritional deficiency: Secondary | ICD-10-CM

## 2014-07-06 DIAGNOSIS — I129 Hypertensive chronic kidney disease with stage 1 through stage 4 chronic kidney disease, or unspecified chronic kidney disease: Secondary | ICD-10-CM

## 2014-07-06 DIAGNOSIS — L89154 Pressure ulcer of sacral region, stage 4: Secondary | ICD-10-CM

## 2014-07-06 DIAGNOSIS — F0280 Dementia in other diseases classified elsewhere without behavioral disturbance: Secondary | ICD-10-CM

## 2014-07-06 DIAGNOSIS — G308 Other Alzheimer's disease: Secondary | ICD-10-CM

## 2014-07-06 DIAGNOSIS — R509 Fever, unspecified: Secondary | ICD-10-CM | POA: Insufficient documentation

## 2014-07-06 DIAGNOSIS — N179 Acute kidney failure, unspecified: Secondary | ICD-10-CM | POA: Insufficient documentation

## 2014-07-06 DIAGNOSIS — E43 Unspecified severe protein-calorie malnutrition: Secondary | ICD-10-CM

## 2014-07-06 LAB — BASIC METABOLIC PANEL
ANION GAP: 12 (ref 5–15)
Anion gap: 11 (ref 5–15)
Anion gap: 12 (ref 5–15)
BUN: 57 mg/dL — ABNORMAL HIGH (ref 6–23)
BUN: 53 mg/dL — ABNORMAL HIGH (ref 6–23)
BUN: 49 mg/dL — ABNORMAL HIGH (ref 6–23)
CALCIUM: 8.6 mg/dL (ref 8.4–10.5)
CALCIUM: 8.6 mg/dL (ref 8.4–10.5)
CO2: 25 mEq/L (ref 19–32)
CO2: 26 mEq/L (ref 19–32)
CO2: 27 meq/L (ref 19–32)
CREATININE: 1.03 mg/dL (ref 0.50–1.10)
Calcium: 8.5 mg/dL (ref 8.4–10.5)
Chloride: 113 mEq/L — ABNORMAL HIGH (ref 96–112)
Chloride: 112 mEq/L (ref 96–112)
Chloride: 109 mEq/L (ref 96–112)
Creatinine, Ser: 1.14 mg/dL — ABNORMAL HIGH (ref 0.50–1.10)
Creatinine, Ser: 1.04 mg/dL (ref 0.50–1.10)
GFR calc Af Amer: 55 mL/min — ABNORMAL LOW (ref >90)
GFR calc non Af Amer: 47 mL/min — ABNORMAL LOW (ref >90)
GFR, EST AFRICAN AMERICAN: 49 mL/min — AB (ref >90)
GFR, EST AFRICAN AMERICAN: 54 mL/min — AB (ref >90)
GFR, EST NON AFRICAN AMERICAN: 42 mL/min — AB (ref >90)
GFR, EST NON AFRICAN AMERICAN: 47 mL/min — AB (ref >90)
Glucose, Bld: 155 mg/dL — ABNORMAL HIGH (ref 70–99)
Glucose, Bld: 81 mg/dL (ref 70–99)
Glucose, Bld: 146 mg/dL — ABNORMAL HIGH (ref 70–99)
POTASSIUM: 4 meq/L (ref 3.7–5.3)
Potassium: 4.2 mEq/L (ref 3.7–5.3)
Potassium: 3.9 mEq/L (ref 3.7–5.3)
SODIUM: 149 meq/L — AB (ref 137–147)
SODIUM: 150 meq/L — AB (ref 137–147)
SODIUM: 148 meq/L — AB (ref 137–147)

## 2014-07-06 LAB — URINE CULTURE: Colony Count: 95000

## 2014-07-06 LAB — GLUCOSE, CAPILLARY
GLUCOSE-CAPILLARY: 64 mg/dL — AB (ref 70–99)
GLUCOSE-CAPILLARY: 68 mg/dL — AB (ref 70–99)

## 2014-07-06 LAB — TROPONIN I

## 2014-07-06 LAB — CBC
HCT: 30.7 % — ABNORMAL LOW (ref 36.0–46.0)
Hemoglobin: 9.2 g/dL — ABNORMAL LOW (ref 12.0–15.0)
MCH: 23.8 pg — ABNORMAL LOW (ref 26.0–34.0)
MCHC: 30 g/dL (ref 30.0–36.0)
MCV: 79.5 fL (ref 78.0–100.0)
PLATELETS: 218 10*3/uL (ref 150–400)
RBC: 3.86 MIL/uL — AB (ref 3.87–5.11)
RDW: 16.5 % — ABNORMAL HIGH (ref 11.5–15.5)
WBC: 7 10*3/uL (ref 4.0–10.5)

## 2014-07-06 MED ORDER — DEXTROSE 50 % IV SOLN
25.0000 mL | Freq: Once | INTRAVENOUS | Status: AC
  Administered 2014-07-06: 25 mL via INTRAVENOUS

## 2014-07-06 MED ORDER — CHLORHEXIDINE GLUCONATE CLOTH 2 % EX PADS
6.0000 | MEDICATED_PAD | Freq: Every day | CUTANEOUS | Status: DC
  Administered 2014-07-07 – 2014-07-08 (×2): 6 via TOPICAL

## 2014-07-06 MED ORDER — WHITE PETROLATUM GEL
Status: AC
  Administered 2014-07-06: 0.2
  Filled 2014-07-06: qty 5

## 2014-07-06 MED ORDER — MUPIROCIN 2 % EX OINT
1.0000 | TOPICAL_OINTMENT | Freq: Two times a day (BID) | CUTANEOUS | Status: DC
Start: 2014-07-06 — End: 2014-07-08
  Administered 2014-07-07 – 2014-07-08 (×4): 1 via NASAL
  Filled 2014-07-06: qty 22

## 2014-07-06 NOTE — Progress Notes (Signed)
  Date: 07/06/2014  Patient name: Michelle Hooper  Medical record number: [id number]  Date of birth: 1925/04/27   I have seen and evaluated Michelle Hooper and discussed their care with the Residency Team.  Michelle Hooper is an 80yo woman with HTN, AD, anemia, Stage IV sacral decubitus ulcer, chronic indwelling foley catheter, gtube, heel wounds who presented with a fever from her nursing home.  She is nonverbal so information was gathered from report from resident team who discussed her care with her NH and the ED.  Report from NH is that she had a UA on 12/18 which showed a possible infection and she was started on Ciprofloxacin for this.   On exam, she is curled on her left side, she has bandages covering wounds on ankles and feet.  She is non verbal.  RR, NR, + systolic murmur.  Abdomen is soft, G tube is present and in place without surrounding erythema or apparent pain.  Foley in place draining yellow urine.  Skin wounds were covered for my exam.    On lab work, she had an elevated Cr and Na.  She had no elevation to her WBC.  Repeat UA showed large LE, small Hgb, 21-50 WBC, no squamous epithelials and few bacteria.    Assessment and Plan: I have seen and evaluated the patient as outlined above. I agree with the formulated Assessment and Plan as detailed in the residents' admission note, with the following changes:   1. Fever, possible UTI - Reported as 101.8 at NH, afebrile here.  Urine appears somewhat dirty, foley has been replaced.  She was started on broad spectrum Abx in the ED with Vancomycin and Zosyn.  She is positive for MRSA on nares screen and MRSA UTI is possible given chronic indwelling foley.  CXR showed nothing acute - Continue vancomycin and zosyn for now - UC and BC are pending - Per resident team, wounds appeared clean and healing.  - Wound care consult  2. Hypernatremia - Free water deficit calculated at 1.6 L - D5W replacement water along with free water flushes through  G tube - BMP q12-24 hours.   - Attempting to lower the Na by a max of 10 mEq per day  3. AKI on CKD 2 - Cr 1.26 on admission, up from baseline of around 0.7 - Fluids as noted above - Monitor with frequent BMET.   4. Chronic wounds - Wound care consult - Frequent turning and offloading of wounds - Pain regimen based on home meds of baclofen and oxycodone  For further details, please see resident admission note.   Michelle CatalinaEmily B Montay Vanvoorhis, MD 12/20/201511:51 AM

## 2014-07-06 NOTE — Progress Notes (Signed)
Pt HR 114-120s no c/o of cp or sob.  MD made aware. No new orders.

## 2014-07-06 NOTE — Clinical Social Work Psychosocial (Signed)
Clinical Social Work Department BRIEF PSYCHOSOCIAL ASSESSMENT 07/06/2014  Patient:  Michelle Hooper,Michelle Hooper     Account Number:  [id number]     Admit date:  07/05/2014  Clinical Social Worker:  Patrick-jefferson,Telissa Palmisano, LCSWA  Date/Time:  07/06/2014 04:49 PM  Referred by:  Physician  Date Referred:  07/06/2014 Referred for  SNF Placement   Other Referral:   Interview type:  Family Other interview type:   Patient's daughter [name].    PSYCHOSOCIAL DATA Living Status:  FACILITY Admitted from facility:  [facility] Level of care:  Skilled Nursing Facility Primary support name:  [name] [phone]) HCPOA Primary support relationship to patient:  CHILD, ADULT Degree of support available:   Good    CURRENT CONCERNS Current Concerns  Post-Acute Placement   Other Concerns:    SOCIAL WORK ASSESSMENT / PLAN CSW contacted patient's daughter ([name]) and introduced self. CSW explained role and discussed d/c plan. Patient's daughter became upset and stated she does not want patient to return to [facility]. [name] states she will begin looking for a new SNF on the following date. [name] voiced displeasure with facility and states patient was only there for 3 weeks and she did not receive quality care. Patient's daughter requests [facility]. CSW explained to patient's daughter criteria for [facility]. Patient's daughter states if patient cannot go to [facility], she wants another facility and not [facility] or [facility]. Per daughter, she would care for patient at home, but is unable to at this time because she has to work. CSW provided appropriate emotional support to daughter.   Assessment/plan status:  Other - See comment Other assessment/ plan:   CSW to update FL2 for SNF placement.   Information/referral to community resources:    PATIENT'S/FAMILY'S RESPONSE TO PLAN OF CARE: Patient's daughter is agreeable to SNF placement, as she states patients needs placement  until she is able to care for her at her home. Daughter was upset about previous placement at [facility] and [facility]. Daughter wants new facility placement for patient.   Rubye Strohmeyer Patrick-Jefferson, LCSWA Weekend Clinical Social Worker [phone]  

## 2014-07-06 NOTE — Progress Notes (Signed)
Subjective: Remains nonverbal this morning.  Objective: Vital signs in last 24 hours: Filed Vitals:   07/05/14 2157 07/05/14 2200 07/05/14 2245 07/06/14 0457  BP: 137/55   121/59  Pulse: 118   83  Temp:  97.7 F (36.5 C)  98.6 F (37 C)  TempSrc:  Oral  Oral  Resp: 20   18  Height:  4\' 11"  (1.499 m)    Weight:    119 lb 3.2 oz (54.069 kg)  SpO2: 78%  94% 100%   Weight change:   Intake/Output Summary (Last 24 hours) at 07/06/14 1159 Last data filed at 07/06/14 0958  Gross per 24 hour  Intake 1539.33 ml  Output   1120 ml  Net 419.33 ml   General Apperance: NAD HEENT: Normocephalic, atraumatic, PERRL, anicteric sclera Neck: Supple, trachea midline Back: No tenderness or bony abnormality  Lungs: Clear to auscultation bilaterally. No wheezes, rhonchi or rales. Breathing comfortably Chest Wall: Nontender, no deformity Heart: RRR, +systolic murmur, no rub/gallop Abdomen: Soft, nontender, nondistended, no rebound/guarding, +G tube without surrounding erythema Extremities: Warm and well perfused, no edema Pulses: 2+ throughout Skin: Large sacral ulcer with clean bases, ulcers along left and right lateral feet bilaterally with dressing in place with serosanguinous strikethrough. No erythema or purulent drainage. Neurologic: Alert. Nonverbal. Unable to follow commands. Moving extremities spontaneously.  Lab Results: Basic Metabolic Panel:  Recent Labs Lab 07/06/14 0131 07/06/14 0433  NA 149* 150*  K 4.2 4.0  CL 113* 112  CO2 25 26  GLUCOSE 155* 81  BUN 57* 53*  CREATININE 1.14* 1.04  CALCIUM 8.6 8.6   Liver Function Tests:  Recent Labs Lab 07/05/14 1012  AST 37  ALT 27  ALKPHOS 97  BILITOT 0.3  PROT 7.8  ALBUMIN 2.4*   CBC:  Recent Labs Lab 07/05/14 1012 07/06/14 0433  WBC 8.7 7.0  NEUTROABS 6.6  --   HGB 9.6* 9.2*  HCT 32.6* 30.7*  MCV 78.9 79.5  PLT 256 218   Cardiac Enzymes:  Recent Labs Lab 07/05/14 1327 07/05/14 1830 07/06/14 0130    TROPONINI <0.30 <0.30 <0.30   CBG:  Recent Labs Lab 07/05/14 0827  GLUCAP 80   Urinalysis:  Recent Labs Lab 07/05/14 1129  COLORURINE YELLOW  LABSPEC 1.014  PHURINE 8.5*  GLUCOSEU NEGATIVE  HGBUR SMALL*  BILIRUBINUR NEGATIVE  KETONESUR NEGATIVE  PROTEINUR NEGATIVE  UROBILINOGEN 0.2  NITRITE NEGATIVE  LEUKOCYTESUR LARGE*    Micro Results: Recent Results (from the past 240 hour(s))  MRSA PCR Screening     Status: Abnormal   Collection Time: 07/05/14  3:06 PM  Result Value Ref Range Status   MRSA by PCR POSITIVE (A) NEGATIVE Final    Comment:        The GeneXpert MRSA Assay (FDA approved for NASAL specimens only), is one component of a comprehensive MRSA colonization surveillance program. It is not intended to diagnose MRSA infection nor to guide or monitor treatment for MRSA infections. RESULT CALLED TO, READ BACK BY AND VERIFIED WITH: JOHNSON,L RN 07/05/14 1753 WOOTEN,K    Studies/Results: Dg Chest 1 View  07/05/2014   CLINICAL DATA:  Stage IV sacral wound.  Fever  EXAM: CHEST - 1 VIEW  COMPARISON:  01/30/2014  FINDINGS: There is elevation of the left diaphragm. There is bilateral mild interstitial prominence. There is no focal parenchymal opacity, pleural effusion, or pneumothorax. There is stable cardiomegaly. There is thoracic aortic atherosclerosis.  The osseous structures are unremarkable.  IMPRESSION: 1. No acute  cardiopulmonary disease. 2. Stable cardiomegaly.   Electronically Signed   By: Elige KoHetal  Patel   On: 07/05/2014 09:39   Medications: I have reviewed the patient's current medications. Scheduled Meds: . antiseptic oral rinse  7 mL Mouth Rinse BID  . baclofen  10 mg Per Tube TID  . famotidine  20 mg Per Tube BID  . feeding supplement (JEVITY 1.2 CAL)  330 mL Per Tube QID  . ferrous sulfate  300 mg Per Tube BID WC  . free water  225 mL Per Tube QID  . heparin  5,000 Units Subcutaneous 3 times per day  . piperacillin-tazobactam (ZOSYN)  IV  3.375  g Intravenous 3 times per day  . vancomycin  750 mg Intravenous Q24H  . vitamin C  500 mg Per Tube Daily   Continuous Infusions: . dextrose 90 mL/hr at 07/06/14 0559   PRN Meds:.acetaminophen **OR** acetaminophen, sodium chloride Assessment/Plan: Active Problems:   UTI (lower urinary tract infection)   Alzheimer's dementia   Severe protein-calorie malnutrition   Decubitus ulcer of coccygeal region, stage 4   Acute renal failure   Hypernatremia  Fever: Afebrile overnight.  -f/u BCx x2, UCx -Continue Vancomycin 750mg  every 24 hr -Continue IV Zosyn 3.375 gm every 8 hours -Wound care consult  Hypernatremia: Receives 225ml free water QID via G tube. May be 2/2 inadequate free water. Sodium 150. Will correct max 10 meq/L per day. -Nutrition consult -D5W was increased to 90cc/hr last night. Will recheck BMP today and adjust as appropriate. -Continue home water flush 225ml QID  AKI on CKD stage 2: Cr 1.26 at admission. Baseline 0.75. Down to 1.04 from 1.26. -D5W @ 90cc/hr -Continue home water flush 225ml QID -Continue to monitor  Hypertension: On metoprolol 75mg  BID and amlodipine 10mg  daily. Normotensive presently. -Hold home metoprolol 75mg  BID and amlodpine 10mg  daily.  Chronic anemia: Hgb 9.6 with normal MCV 78.9. Baseline 9-10 -Continue home Ferrous sulfate 330mg  BID  Chronic wounds: -Wound care consult -Continue home Baclofen 10mg  TID -Hold home oxycodone 5mg  Q6hr prn pain for now -Continue home Vitamin C 500mg  daily  FEN:  -NPO -Jevity 1.2 330mL QID per g tube -Water flush 225mL QID per g tube -Continue home Pepcid 20mg  BID  VTE ppx: subq hep 5000u QID  Dispo: Disposition is deferred at this time, awaiting improvement of current medical problems.  Anticipated discharge in approximately 1-2 day(s).   The patient does have a current PCP Jackie Plum([name], MD) and [facility] after discharge.  The patient does not have  transportation limitations that hinder transportation to clinic appointments.  .Services Needed at time of discharge: Y = Yes, Blank = No PT:   OT:   RN:   Equipment:   Other:     LOS: 1 day   Griffin BasilJennifer Krall, MD 07/06/2014, 11:59 AM

## 2014-07-07 DIAGNOSIS — N179 Acute kidney failure, unspecified: Secondary | ICD-10-CM

## 2014-07-07 DIAGNOSIS — R509 Fever, unspecified: Secondary | ICD-10-CM

## 2014-07-07 DIAGNOSIS — D649 Anemia, unspecified: Secondary | ICD-10-CM

## 2014-07-07 DIAGNOSIS — E870 Hyperosmolality and hypernatremia: Secondary | ICD-10-CM

## 2014-07-07 DIAGNOSIS — I10 Essential (primary) hypertension: Secondary | ICD-10-CM

## 2014-07-07 DIAGNOSIS — N182 Chronic kidney disease, stage 2 (mild): Secondary | ICD-10-CM

## 2014-07-07 LAB — CBC
HEMATOCRIT: 30.5 % — AB (ref 36.0–46.0)
Hemoglobin: 9.5 g/dL — ABNORMAL LOW (ref 12.0–15.0)
MCH: 24.2 pg — ABNORMAL LOW (ref 26.0–34.0)
MCHC: 31.1 g/dL (ref 30.0–36.0)
MCV: 77.8 fL — ABNORMAL LOW (ref 78.0–100.0)
PLATELETS: 188 10*3/uL (ref 150–400)
RBC: 3.92 MIL/uL (ref 3.87–5.11)
RDW: 16.3 % — AB (ref 11.5–15.5)
WBC: 5.8 10*3/uL (ref 4.0–10.5)

## 2014-07-07 LAB — BASIC METABOLIC PANEL
ANION GAP: 13 (ref 5–15)
BUN: 35 mg/dL — ABNORMAL HIGH (ref 6–23)
CALCIUM: 8.4 mg/dL (ref 8.4–10.5)
CO2: 26 mEq/L (ref 19–32)
Chloride: 108 mEq/L (ref 96–112)
Creatinine, Ser: 0.92 mg/dL (ref 0.50–1.10)
GFR calc Af Amer: 63 mL/min — ABNORMAL LOW (ref >90)
GFR calc non Af Amer: 54 mL/min — ABNORMAL LOW (ref >90)
Glucose, Bld: 88 mg/dL (ref 70–99)
Potassium: 4.1 mEq/L (ref 3.7–5.3)
Sodium: 147 mEq/L (ref 137–147)

## 2014-07-07 LAB — GLUCOSE, CAPILLARY
Glucose-Capillary: 140 mg/dL — ABNORMAL HIGH (ref 70–99)
Glucose-Capillary: 61 mg/dL — ABNORMAL LOW (ref 70–99)
Glucose-Capillary: 112 mg/dL — ABNORMAL HIGH (ref 70–99)
Glucose-Capillary: 60 mg/dL — ABNORMAL LOW (ref 70–99)
Glucose-Capillary: 171 mg/dL — ABNORMAL HIGH (ref 70–99)
Glucose-Capillary: 82 mg/dL (ref 70–99)
Glucose-Capillary: 128 mg/dL — ABNORMAL HIGH (ref 70–99)
Glucose-Capillary: 86 mg/dL (ref 70–99)

## 2014-07-07 MED ORDER — DEXTROSE 50 % IV SOLN
INTRAVENOUS | Status: AC
  Administered 2014-07-07: 50 mL via INTRAVENOUS
  Filled 2014-07-07: qty 50

## 2014-07-07 MED ORDER — PRO-STAT SUGAR FREE PO LIQD
30.0000 mL | Freq: Every day | ORAL | Status: DC
  Administered 2014-07-07 – 2014-07-08 (×2): 30 mL
  Filled 2014-07-07 (×2): qty 30

## 2014-07-07 MED ORDER — DEXTROSE 50 % IV SOLN
INTRAVENOUS | Status: AC
  Filled 2014-07-07: qty 50

## 2014-07-07 MED ORDER — LEVOFLOXACIN IN D5W 750 MG/150ML IV SOLN
750.0000 mg | INTRAVENOUS | Status: DC
  Administered 2014-07-07: 750 mg via INTRAVENOUS
  Filled 2014-07-07: qty 150

## 2014-07-07 MED ORDER — DEXTROSE 50 % IV SOLN
25.0000 mL | Freq: Once | INTRAVENOUS | Status: AC
  Administered 2014-07-07: 25 mL via INTRAVENOUS

## 2014-07-07 MED ORDER — COLLAGENASE 250 UNIT/GM EX OINT
TOPICAL_OINTMENT | Freq: Every day | CUTANEOUS | Status: DC
  Administered 2014-07-07 – 2014-07-08 (×2): via TOPICAL
  Filled 2014-07-07: qty 30

## 2014-07-07 MED ORDER — DEXTROSE 50 % IV SOLN
1.0000 | Freq: Once | INTRAVENOUS | Status: AC
  Administered 2014-07-07: 50 mL via INTRAVENOUS

## 2014-07-07 NOTE — Clinical Documentation Improvement (Signed)
Noted in progress note provider documented Skin: Large sacral ulcer with clean bases, ulcers along left and right lateral feet bilaterally.  Please specify location, stage and POA status of each pressure ulcer.  . Site (include laterality):   --Sacral --Hip --Buttock --Ankle --Heel  --Other (specify) . Pressure Ulcer Stage: --Stage 1 --Stage 2 --Stage 3 --Stage 4 --Unspecified  . Document any associated diagnoses/conditions . Document if ulcer (including stage) is present on admission  Supporting Information: refer WOC wound consult note Thank You,  Elpidio AnisGarnet Ariaunna Longsworth, RN, BSN, CDI [phone] [facility] HIM Department

## 2014-07-07 NOTE — Plan of Care (Signed)
Problem: Phase II Progression Outcomes Goal: Obtain order to discontinue catheter if appropriate Outcome: Not Met (add Reason) Chronic foley

## 2014-07-07 NOTE — Consult Note (Signed)
WOC wound consult note Reason for Consult: Consult requested for sacrum and BLE. Wound type: Sacrum with chronic stage 4 wound which was debrided in 7/15 by [facility] according to the EMR.   8X2X.8cm with undermining to .5 cm, bone palpable.  100% red and moist, no odor. Left outer ankle with stage 4 wound; 3X1.5X.2cm; bone palpable, no odor, mod amt yellow drainage, red and moist. Right outer ankle with unstageable wound; 3X2.5X.2cm, 80% yellow slough, 20% red.  Small amt yellow drainage, no odor. Pressure Ulcer POA: Yes Dressing procedure/placement/frequency: Aquacel to absorb drainage and provide antimicrobial benefits to sacrum, and cover with foam dressing.  Foam dressing to absorb drainage and promote healing to left outer ankle.  Santyl for chemical debridement of nonviable tissue to right outer ankle. Please re-consult if further assistance is needed.  Thank-you,  Cammie Mcgeeawn Azura Tufaro MSN, RN, CWOCN, [place]CN-AP, CNS [phone]

## 2014-07-07 NOTE — Progress Notes (Addendum)
Subjective: Remains nonverbal this morning.  Objective: Vital signs in last 24 hours: Filed Vitals:   07/06/14 1410 07/06/14 2201 07/07/14 0427 07/07/14 0554  BP: 158/69 160/72  136/67  Pulse: 85 101  105  Temp: 99.8 F (37.7 C) 100.4 F (38 C)  98.6 F (37 C)  TempSrc: Oral Oral  Oral  Resp: 16 16  18   Height:      Weight:   120 lb 6.4 oz (54.613 kg)   SpO2: 98% 100%  92%   Weight change: -6.6 oz (-0.187 kg)  Intake/Output Summary (Last 24 hours) at 07/07/14 8657 Last data filed at 07/07/14 0428  Gross per 24 hour  Intake 2945.5 ml  Output    950 ml  Net 1995.5 ml   General Apperance: NAD HEENT: Normocephalic, atraumatic, PERRL, anicteric sclera Neck: Supple, trachea midline Back: No tenderness or bony abnormality  Lungs: Clear to auscultation bilaterally. No wheezes, rhonchi or rales. Breathing comfortably Chest Wall: Nontender, no deformity Heart: RRR, +systolic murmur, no rub/gallop Abdomen: Soft, nontender, nondistended, no rebound/guarding, +G tube without surrounding erythema Extremities: Warm and well perfused, no edema Pulses: 2+ throughout Skin: Large sacral ulcer with clean bases, ulcers along left and right lateral feet bilaterally with dressing in place with serosanguinous strikethrough. No erythema or purulent drainage. Neurologic: Alert. Nonverbal. Unable to follow commands. Moving extremities spontaneously.  Lab Results: Basic Metabolic Panel:  Recent Labs Lab 07/06/14 1430 07/07/14 0700  NA 148* 147  K 3.9 4.1  CL 109 108  CO2 27 26  GLUCOSE 146* 88  BUN 49* 35*  CREATININE 1.03 0.92  CALCIUM 8.5 8.4   Liver Function Tests:  Recent Labs Lab 07/05/14 1012  AST 37  ALT 27  ALKPHOS 97  BILITOT 0.3  PROT 7.8  ALBUMIN 2.4*   CBC:  Recent Labs Lab 07/05/14 1012 07/06/14 0433 07/07/14 0700  WBC 8.7 7.0 5.8  NEUTROABS 6.6  --   --   HGB 9.6* 9.2* 9.5*  HCT 32.6* 30.7* 30.5*  MCV 78.9 79.5 77.8*  PLT 256 218 188   Cardiac  Enzymes:  Recent Labs Lab 07/05/14 1327 07/05/14 1830 07/06/14 0130  TROPONINI <0.30 <0.30 <0.30   CBG:  Recent Labs Lab 07/06/14 2210 07/06/14 2318 07/07/14 0325 07/07/14 0357 07/07/14 0804 07/07/14 0913  GLUCAP 68* 140* 61* 112* 60* 171*   Urinalysis:  Recent Labs Lab 07/05/14 1129  COLORURINE YELLOW  LABSPEC 1.014  PHURINE 8.5*  GLUCOSEU NEGATIVE  HGBUR SMALL*  BILIRUBINUR NEGATIVE  KETONESUR NEGATIVE  PROTEINUR NEGATIVE  UROBILINOGEN 0.2  NITRITE NEGATIVE  LEUKOCYTESUR LARGE*    Micro Results: Recent Results (from the past 240 hour(s))  Blood Culture (routine x 2)     Status: None (Preliminary result)   Collection Time: 07/05/14 10:12 AM  Result Value Ref Range Status   Specimen Description BLOOD RIGHT ARM  Final   Special Requests BOTTLES DRAWN AEROBIC AND ANAEROBIC 4CC  Final   Culture  Setup Time   Final    07/05/2014 18:59 Performed at [facility]    Culture   Final           BLOOD CULTURE RECEIVED NO GROWTH TO DATE CULTURE WILL BE HELD FOR 5 DAYS BEFORE ISSUING A FINAL NEGATIVE REPORT Performed at [facility]    Report Status PENDING  Incomplete  Blood Culture (routine x 2)     Status: None (Preliminary result)   Collection Time: 07/05/14 10:40 AM  Result Value Ref Range  Status   Specimen Description BLOOD RIGHT ARM  Final   Special Requests BOTTLES DRAWN AEROBIC AND ANAEROBIC 10CC  Final   Culture  Setup Time   Final    07/05/2014 18:59 [facility]formed at [facility]    Culture   Final           BLOOD CULTURE RECEIVED NO GROWTH TO DATE CULTURE WILL BE HELD FOR 5 DAYS BEFORE ISSUING A FINAL NEGATIVE REPORT [facility]formed at [facility]    Report Status PENDING  Incomplete  Urine culture     Status: None   Collection Time: 07/05/14 11:29 AM  Result Value Ref Range Status   Specimen Description URINE, CATHETERIZED  Final   Special Requests NONE  Final   Culture  Setup Time   Final    07/05/2014 14:52 Performed  at [facility]    Colony Count   Final    95,000 COLONIES/ML Performed at [facility]    Culture   Final    Multiple bacterial morphotypes present, none predominant. Suggest appropriate recollection if clinically indicated. Performed at [facility]    Report Status 07/06/2014 FINAL  Final  MRSA PCR Screening     Status: Abnormal   Collection Time: 07/05/14  3:06 PM  Result Value Ref Range Status   MRSA by PCR POSITIVE (A) NEGATIVE Final    Comment:        The GeneXpert MRSA Assay (FDA approved for NASAL specimens only), is one component of a comprehensive MRSA colonization surveillance program. It is not intended to diagnose MRSA infection nor to guide or monitor treatment for MRSA infections. RESULT CALLED TO, READ BACK BY AND VERIFIED WITH: JOHNSON,L RN 07/05/14 1753 WOOTEN,K    Studies/Results: Dg Chest 1 View  07/05/2014   CLINICAL DATA:  Stage IV sacral wound.  Fever  EXAM: CHEST - 1 VIEW  COMPARISON:  01/30/2014  FINDINGS: There is elevation of the left diaphragm. There is bilateral mild interstitial prominence. There is no focal parenchymal opacity, pleural effusion, or pneumothorax. There is stable cardiomegaly. There is thoracic aortic atherosclerosis.  The osseous structures are unremarkable.  IMPRESSION: 1. No acute cardiopulmonary disease. 2. Stable cardiomegaly.   Electronically Signed   By: Elige KoHetal  Patel   On: 07/05/2014 09:39   Medications: I have reviewed the patient's current medications. Scheduled Meds: . antiseptic oral rinse  7 mL Mouth Rinse BID  . baclofen  10 mg Per Tube TID  . Chlorhexidine Gluconate Cloth  6 each Topical Q0600  . famotidine  20 mg Per Tube BID  . feeding supplement (JEVITY 1.2 CAL)  330 mL Per Tube QID  . ferrous sulfate  300 mg Per Tube BID WC  . free water  225 mL Per Tube QID  . heparin  5,000 Units Subcutaneous 3 times per day  . mupirocin ointment  1 application Nasal BID  . piperacillin-tazobactam  (ZOSYN)  IV  3.375 g Intravenous 3 times per day  . vancomycin  750 mg Intravenous Q24H  . vitamin C  500 mg Per Tube Daily   Continuous Infusions: . dextrose 90 mL/hr at 07/07/14 0437   PRN Meds:.acetaminophen **OR** acetaminophen, sodium chloride Assessment/Plan: Active Problems:   UTI (lower urinary tract infection)   Alzheimer's dementia   Severe protein-calorie malnutrition   Decubitus ulcer of coccygeal region, stage 4   Acute renal failure   Hypernatremia   Acute hypernatremia   Acute kidney injury   Fever of undetermined origin  Fever: Temp to 100.4 last night, afebrile this morning. Foley changed yesterday. UCx with [id number], no predominant organisms. BCx NGTD. -Resent UCx. -d/c vanc/zosyn and start levofloxacin 750mg  daily -Wound care consult  Hypernatremia: Receives 225ml free water QID via G tube. May be 2/2 inadequate free water. Sodium 147. Will correct max 10 meq/L per day. -Nutrition consult -D5W at 90cc/hr last night. -Continue home water flush 225ml QID  AKI on CKD stage 2: Cr 1.26 at admission. Baseline 0.75. Down to 0.92 from 1.04. -D5W @ 90cc/hr -Continue home water flush 225ml QID -Continue to monitor  Hypertension: On metoprolol 75mg  BID and amlodipine 10mg  daily. -Hold home metoprolol 75mg  BID and amlodpine 10mg  daily.  Chronic anemia: Hgb 9.5 with normal MCV 78.9. Baseline 9-10 -Continue home Ferrous sulfate 330mg  BID  Chronic wounds: -Wound care consult -Continue home Baclofen 10mg  TID -Hold home oxycodone 5mg  Q6hr prn pain for now -Continue home Vitamin C 500mg  daily  FEN:  -NPO -Jevity 1.2 330mL QID per g tube -Water flush 225mL QID per g tube -Continue home Pepcid 20mg  BID  VTE ppx: subq hep 5000u QID  Dispo: Disposition is deferred at this time, awaiting improvement of current medical problems.  Anticipated discharge in approximately 1-2 day(s).   The patient does have a current PCP Jackie Plum([name], MD) and does not need an [facility]  hospital follow-up appointment after discharge.  The patient does not have transportation limitations that hinder transportation to clinic appointments.  .Services Needed at time of discharge: Y = Yes, Blank = No PT:   OT:   RN:   Equipment:   Other:     LOS: 2 days   Griffin BasilJennifer Krall, MD 07/07/2014, 9:22 AM

## 2014-07-07 NOTE — Progress Notes (Signed)
Pt seen and examined with Dr. Isabella BowensKrall. Plan of care formulated on morning rounds. I agree with findings and plan as outlined in her note  Assessment and Plan: 80 y/o female with advanced dementia p/w fevers from SNF likely secondary to UTI  UTI: - urine cx with 95,000 colonies with multiple bacterial morphotypes - Will recheck urine cx - d/c vanco and zosyn and start levaquin PO  Chronic wounds: - Pt with Stage 4 sacral decub, L ankle stage 4 wound and R ankle with unstageable wound - Wound care following - Wounds appear chronic with no active signs of infection - c/w dressings per wound care - c/w pain control  Hypernatremia: - Na now normalized. - Will c/w IVF and free water flushes - Recheck BMP in AM

## 2014-07-07 NOTE — Care Management Note (Unsigned)
    Page 1 of 1   07/07/2014     4:30:25 PM CARE MANAGEMENT NOTE 07/07/2014  Patient:  Donahey,Hilari   Account Number:  [id number]  Date Initiated:  07/07/2014  Documentation initiated by:  Letha CapeAYLOR,Pernie Grosso  Subjective/Objective Assessment:   dx fever, decbuitus ulcer  admit- from [facility]     Action/Plan:   Anticipated DC Date:  07/08/2014   Anticipated DC Plan:  SKILLED NURSING FACILITY  In-house referral  Clinical Social Worker      DC Planning Services  CM consult      Choice offered to / List presented to:             Status of service:  In process, will continue to follow Medicare Important Message given?  YES (If response is "NO", the following Medicare IM given date fields will be blank) Date Medicare IM given:  07/07/2014 Medicare IM given by:  Letha CapeAYLOR,[place] Date Additional Medicare IM given:   Additional Medicare IM given by:    Discharge Disposition:    Per UR Regulation:  Reviewed for med. necessity/level of care/duration of stay  If discussed at Long Length of Stay Meetings, dates discussed:    Comments:  07/07/14 1629 Letha Capeeborah Jamar Casagrande RN, BSN [phone] [facility] CSW referral.

## 2014-07-07 NOTE — Progress Notes (Signed)
Hypoglycemic Event  CBG: 61  Treatment: D50 IV 25 mL  Symptoms: None  Follow-up CBG: Time: 0357 CBG Result:112  Possible Reasons for Event: Unknown  Comments/MD notified:On-call provider notified.    Michelle Hooper, Denzil Bristol L  Remember to initiate Hypoglycemia Order Set & complete

## 2014-07-07 NOTE — Progress Notes (Addendum)
Hypoglycemic Event  CBG: 68  Treatment: D50 IV 25 mL  Symptoms: None  Follow-up CBG: Time: 2318 CBG Result: 140  Possible Reasons for Event: Unknown  Comments/MD notified: On-call MD notified    Rometta Emeryonley, Kregg Cihlar L  Remember to initiate Hypoglycemia Order Set & complete

## 2014-07-07 NOTE — Clinical Documentation Improvement (Signed)
Please clarify if pt meets any of clinical condition due to mobility status.  Possible Conditions: _______________Functional Quadriplegia _______________Paraplegia  Other Not able to determine  Risk Factors:Risk factors: Non-ambulatory x several months.  Alzheimer's dementia, resident at SNF, stage IV sacral decubitus ulcer, ulcers on bilateral lateral ankles, and L lateral mid foot, chronic indwelling catheter,    Thank you,  Michelle Hooper ,RN Clinical Documentation Specialist:  [phone]  [facility]- Health Information Management

## 2014-07-07 NOTE — Progress Notes (Signed)
INITIAL NUTRITION ASSESSMENT  DOCUMENTATION CODES Per approved criteria  -Not Applicable   INTERVENTION: Continue Jevity 1.2 via G-tube at 330 ml 4 times daily with an addition of 30 ml Prostat once daily per tube to provide 1684 kcal, 88 grams of protein, and 1069 ml of free water.   Continue free water flushes of 225 ml 4 times daily.  Will continue to monitor.  NUTRITION DIAGNOSIS: Inadequate oral intake related to inability to eat as evidenced by NPO status  Goal: Pt to meet >/= 90% of their estimated nutrition needs   Monitor:  TF tolerance, weight trends, labs, I/O's  Reason for Assessment: MD consult for enteral/tube feeding initiation and management.   80 y.o. female  Admitting Dx: Hypernatremia  ASSESSMENT: Pt with history of HTN, Alzheimer's dementia, iron deficiency anemia, stage IV sacral decubitus ulcer, frequent UTI with chronic indwelling catheter, dysphagia s/p g tube presenting with fever. [facility]. Pt is nonverbal.  No family at bedside during time of visit. Unable to obtain nutrition hx. Pt with no observed significant fat or muscle mass loss.  Home TF regimen: -Jevity 1.2 330mL QID per g tube -Water flush 225mL QID per g tube  Pt is currently receiving Jevity 1.2 via G-tube at 330 ml 4 times daily. TF regimen is providing 100% of estimated nutrition needs. Pt with ongoing pressure ulcers. RD to add a Prostat to help in wound healing.   Labs: Low GFR. High BUN.  Height: Ht Readings from Last 1 Encounters:  07/05/14 4\' 11"  (1.499 m)    Weight: Wt Readings from Last 1 Encounters:  07/07/14 120 lb 6.4 oz (54.613 kg)    Ideal Body Weight: 98 lbs  % Ideal Body Weight: 122%  Wt Readings from Last 10 Encounters:  07/07/14 120 lb 6.4 oz (54.613 kg)  01/23/14 109 lb 9.1 oz (49.7 kg)  09/18/13 124 lb (56.246 kg)    Usual Body Weight: 120 lbs  % Usual Body Weight: 100%  BMI:  Body mass index is 24.3  kg/(m^2).  Estimated Nutritional Needs: Kcal: 1500-1700 Protein: 70-85 grams Fluid: >/= 1.5 L/day  Skin: Unstageable pressure ulcer on ankle, Stage IV pressure ulcer on ankle and sacrum  Diet Order: Diet NPO time specified  EDUCATION NEEDS: -Education not appropriate at this time   Intake/Output Summary (Last 24 hours) at 07/07/14 1337 Last data filed at 07/07/14 1050  Gross per 24 hour  Intake   1497 ml  Output    950 ml  Net    547 ml    Last BM: 12/20  Labs:   Recent Labs Lab 07/06/14 0433 07/06/14 1430 07/07/14 0700  NA 150* 148* 147  K 4.0 3.9 4.1  CL 112 109 108  CO2 26 27 26   BUN 53* 49* 35*  CREATININE 1.04 1.03 0.92  CALCIUM 8.6 8.5 8.4  GLUCOSE 81 146* 88    CBG (last 3)   Recent Labs  07/07/14 0804 07/07/14 0913 07/07/14 1147  GLUCAP 60* 171* 82    Scheduled Meds: . antiseptic oral rinse  7 mL Mouth Rinse BID  . baclofen  10 mg Per Tube TID  . Chlorhexidine Gluconate Cloth  6 each Topical Q0600  . collagenase   Topical Daily  . famotidine  20 mg Per Tube BID  . feeding supplement (JEVITY 1.2 CAL)  330 mL Per Tube QID  . ferrous sulfate  300 mg Per Tube BID WC  . free water  225 mL Per Tube QID  . heparin  5,000 Units Subcutaneous 3 times per day  . levofloxacin (LEVAQUIN) IV  750 mg Intravenous Q48H  . mupirocin ointment  1 application Nasal BID  . vitamin C  500 mg Per Tube Daily    Continuous Infusions: . dextrose 90 mL/hr at 07/07/14 [id number]    Past Medical History  Diagnosis Date  . SBO (small bowel obstruction)   . Hypertension   . Alzheimer's dementia   . Iron deficiency anemia   . Frequent UTI     Past Surgical History  Procedure Laterality Date  . Exploratory laparotomy, lysis of adhesions, small bowel  07/30/10  . Irrigation and debridement abscess N/A 01/21/2014    Procedure: IRRIGATION AND DEBRIDEMENT Sacral Decubitus Ulcer;  Surgeon: Axel FillerArmando Ramirez, MD;  Location: [facility] OR;  Service: General;  Laterality: N/A;     Marijean NiemannStephanie La, MS, RD, LDN Pager # [phone] After hours/ weekend pager # [phone]

## 2014-07-08 DIAGNOSIS — N390 Urinary tract infection, site not specified: Principal | ICD-10-CM

## 2014-07-08 DIAGNOSIS — R1310 Dysphagia, unspecified: Secondary | ICD-10-CM

## 2014-07-08 LAB — BASIC METABOLIC PANEL
Anion gap: 7 (ref 5–15)
BUN: 33 mg/dL — AB (ref 6–23)
CALCIUM: 8 mg/dL — AB (ref 8.4–10.5)
CO2: 25 mmol/L (ref 19–32)
Chloride: 107 mEq/L (ref 96–112)
Creatinine, Ser: 0.97 mg/dL (ref 0.50–1.10)
GFR calc Af Amer: 59 mL/min — ABNORMAL LOW (ref >90)
GFR, EST NON AFRICAN AMERICAN: 51 mL/min — AB (ref >90)
GLUCOSE: 95 mg/dL (ref 70–99)
Potassium: 4.9 mmol/L (ref 3.5–5.1)
Sodium: 139 mmol/L (ref 135–145)

## 2014-07-08 LAB — GLUCOSE, CAPILLARY
GLUCOSE-CAPILLARY: 112 mg/dL — AB (ref 70–99)
GLUCOSE-CAPILLARY: 126 mg/dL — AB (ref 70–99)
GLUCOSE-CAPILLARY: 81 mg/dL (ref 70–99)
Glucose-Capillary: 104 mg/dL — ABNORMAL HIGH (ref 70–99)
Glucose-Capillary: 65 mg/dL — ABNORMAL LOW (ref 70–99)
Glucose-Capillary: 115 mg/dL — ABNORMAL HIGH (ref 70–99)
Glucose-Capillary: 68 mg/dL — ABNORMAL LOW (ref 70–99)
Glucose-Capillary: 62 mg/dL — ABNORMAL LOW (ref 70–99)

## 2014-07-08 LAB — CBC
HEMATOCRIT: 29.7 % — AB (ref 36.0–46.0)
Hemoglobin: 9.4 g/dL — ABNORMAL LOW (ref 12.0–15.0)
MCH: 23.7 pg — AB (ref 26.0–34.0)
MCHC: 31.6 g/dL (ref 30.0–36.0)
MCV: 74.8 fL — AB (ref 78.0–100.0)
PLATELETS: 182 10*3/uL (ref 150–400)
RBC: 3.97 MIL/uL (ref 3.87–5.11)
RDW: 16 % — AB (ref 11.5–15.5)
WBC: 5.6 10*3/uL (ref 4.0–10.5)

## 2014-07-08 MED ORDER — JEVITY 1.2 CAL PO LIQD
330.0000 mL | Freq: Four times a day (QID) | ORAL | Status: DC
Start: 2014-07-08 — End: 2014-07-08
  Administered 2014-07-08 (×2): 330 mL
  Filled 2014-07-08 (×6): qty 474

## 2014-07-08 MED ORDER — COLLAGENASE 250 UNIT/GM EX OINT: TOPICAL_OINTMENT | Freq: Every day | CUTANEOUS | Status: AC

## 2014-07-08 MED ORDER — OXYCODONE HCL 5 MG PO TABS
5.0000 mg | ORAL_TABLET | Freq: Four times a day (QID) | ORAL | Status: DC | PRN
  Administered 2014-07-08: 5 mg
  Filled 2014-07-08: qty 1

## 2014-07-08 MED ORDER — AMLODIPINE BESYLATE 10 MG PO TABS: 10.0000 mg | ORAL_TABLET | Freq: Every day | ORAL | Status: AC

## 2014-07-08 MED ORDER — METOPROLOL TARTRATE 25 MG PO TABS
75.0000 mg | ORAL_TABLET | Freq: Two times a day (BID) | ORAL | Status: DC
  Administered 2014-07-08: 75 mg
  Filled 2014-07-08 (×2): qty 1

## 2014-07-08 MED ORDER — DEXTROSE 50 % IV SOLN
INTRAVENOUS | Status: AC
  Filled 2014-07-08: qty 50

## 2014-07-08 MED ORDER — OXYCODONE HCL 5 MG PO TABS: 5.0000 mg | ORAL_TABLET | Freq: Four times a day (QID) | ORAL | Status: AC | PRN

## 2014-07-08 MED ORDER — DEXTROSE 50 % IV SOLN
25.0000 mL | Freq: Once | INTRAVENOUS | Status: AC
  Administered 2014-07-08: 25 mL via INTRAVENOUS

## 2014-07-08 MED ORDER — FREE WATER: 225.0000 mL | Freq: Four times a day (QID) | Status: AC

## 2014-07-08 MED ORDER — LEVOFLOXACIN 750 MG PO TABS: 750.0000 mg | ORAL_TABLET | ORAL | Status: DC

## 2014-07-08 MED ORDER — AMLODIPINE BESYLATE 10 MG PO TABS
10.0000 mg | ORAL_TABLET | Freq: Every day | ORAL | Status: DC
  Administered 2014-07-08: 10 mg
  Filled 2014-07-08: qty 1

## 2014-07-08 MED ORDER — LEVOFLOXACIN 750 MG PO TABS: 750.0000 mg | ORAL_TABLET | ORAL | Status: AC

## 2014-07-08 MED ORDER — DEXTROSE 50 % IV SOLN: 25.0000 mL | Freq: Once | INTRAVENOUS | Status: DC

## 2014-07-08 MED ORDER — PRO-STAT SUGAR FREE PO LIQD: 30.0000 mL | Freq: Every day | ORAL | Status: AC

## 2014-07-08 MED ORDER — JEVITY 1.2 CAL PO LIQD: 330.0000 mL | Freq: Four times a day (QID) | ORAL | Status: AC

## 2014-07-08 MED ORDER — METOPROLOL TARTRATE 50 MG PO TABS: 75.0000 mg | ORAL_TABLET | Freq: Two times a day (BID) | ORAL | Status: AC

## 2014-07-08 NOTE — Progress Notes (Signed)
Hypoglycemic Event  CBG: 60 @ 1710  Treatment: Scheduled Jevity tube feeding  Symptoms: None  Follow-up CBG: Time: 1834 CBG Result: 81  Possible Reasons for Event: Inadequate meal intake  Comments/MD notified:     Eulogio DitchZhu, Cheyane Ayon S  Remember to initiate Hypoglycemia Order Set & complete

## 2014-07-08 NOTE — Clinical Social Work Note (Addendum)
CSW has attempted to reach patient's daughter several times today to assist with finding patient a new facility. CSW will continue to try and reach daughter.  Update: CSW was unable to reach daughter after several attempts through her work number and her home number. Patient is stable for DC today. Since patient was [facility] and has a safe disposition at this time, CSW has arranged for the patient to return [facility] where the daughter can continue her search for a new SNF for the patient. CSW has left detailed message for daughter on home number stating that the patient would [facility]. CSW informed facility that patient's daughter was wanting to find a new facility for the patient.    Roddie McBryant Drabik MSW, [place], [place], [phone]

## 2014-07-08 NOTE — Clinical Social Work Note (Signed)
Per MD patient ready to [facility]. RN, patient/family (several messages left, including once stating the patient would [facility]), and facility notified of patient's DC. RN given number for report. DC packet on patient's chart. Ambulance transport requested for patient for 5:30PM. CSW signing off at this time.   Roddie McBryant Siemon MSW, [place], [place], [phone]

## 2014-07-08 NOTE — Progress Notes (Signed)
Hypoglycemic Event  CBG: 68 @ 0809  Treatment: D50 IV 25 mL  Symptoms: None  Follow-up CBG: Time: 0944 CBG Result: 112  Possible Reasons for Event: Inadequate meal intake  Comments/MD notified: Dr. Charlett LangoNarendra    Kourtnee Lahey, Richarda OsmondJennifer S  Remember to initiate Hypoglycemia Order Set & complete

## 2014-07-08 NOTE — Progress Notes (Signed)
Pt seen and examined with Dr. Isabella BowensKrall. Plan of care formulated on morning rounds with residents. I agree with findings and plan as outlined in her note  Assessment and Plan: 80 y/o female with advanced dementia p/w fevers from SNF likely secondary to UTI  UTI: - urine cx with 95,000 colonies with multiple bacterial morphotypes - Will recheck urine cx. Blood cx with no growth till date - Would d/c on levaquin via PEG to complete 5 day course  - fevers now resolved  Chronic wounds: - Pt with Stage 4 sacral decub, L ankle stage 4 wound and R ankle with unstageable wound - Wound care following - Wounds appear chronic with no active signs of infection - c/w dressings per wound care - c/w pain control  Hypernatremia: - Na now normalized. - D/C IVF. C/w free water flushes via PEG    Pt is stable for d/c to SNF once bed available

## 2014-07-08 NOTE — Progress Notes (Signed)
Nutrition Brief Note  RD contacted for pt's hypoglycemic event this AM. Bolus tube feeding times of administration have been adjusted to help prevent further low blood glucose events. Continue with current nutrition interventions.  Marijean NiemannStephanie La, MS, RD, LDN Pager # [phone] After hours/ weekend pager # [phone]

## 2014-07-08 NOTE — Progress Notes (Signed)
Subjective: Remains nonverbal this morning.  Objective: Vital signs in last 24 hours: Filed Vitals:   07/07/14 1443 07/07/14 2216 07/08/14 0500 07/08/14 0516  BP: 157/57   119/80  Pulse: 92 113  118  Temp: 99.7 F (37.6 C) 99.2 F (37.3 C)  98.6 F (37 C)  TempSrc: Oral Axillary  Oral  Resp: 20 15  15   Height:      Weight:   120 lb 12.8 oz (54.795 kg)   SpO2: 93% 100%  100%   Weight change: 6.4 oz (0.181 kg)  Intake/Output Summary (Last 24 hours) at 07/08/14 [id number] Last data filed at 07/07/14 2318  Gross per 24 hour  Intake   2325 ml  Output   1850 ml  Net    475 ml   General Apperance: NAD HEENT: Normocephalic, atraumatic, PERRL, anicteric sclera Neck: Supple, trachea midline Lungs: Clear to auscultation bilaterally. No wheezes, rhonchi or rales. Breathing comfortably Chest Wall: Nontender, no deformity Heart: RRR, +systolic murmur, no rub/gallop Abdomen: Soft, nontender, nondistended, no rebound/guarding, +G tube without surrounding erythema Extremities: Warm and well perfused, no edema Pulses: 2+ throughout Skin: Large sacral ulcer with clean bases, ulcers along left and right lateral feet bilaterally with dressing in place with serosanguinous strikethrough. No erythema or purulent drainage. Neurologic: Alert. Nonverbal. Unable to follow commands. Moving extremities spontaneously.  Lab Results: Basic Metabolic Panel:  Recent Labs Lab 07/07/14 0700 07/08/14 0600  NA 147 139  K 4.1 4.9  CL 108 107  CO2 26 25  GLUCOSE 88 95  BUN 35* 33*  CREATININE 0.92 0.97  CALCIUM 8.4 8.0*   Liver Function Tests:  Recent Labs Lab 07/05/14 1012  AST 37  ALT 27  ALKPHOS 97  BILITOT 0.3  PROT 7.8  ALBUMIN 2.4*   CBC:  Recent Labs Lab 07/05/14 1012  07/07/14 0700 07/08/14 0600  WBC 8.7  < > 5.8 5.6  NEUTROABS 6.6  --   --   --   HGB 9.6*  < > 9.5* 9.4*  HCT 32.6*  < > 30.5* 29.7*  MCV 78.9  < > 77.8* 74.8*  PLT 256  < > 188 182  < > = values in this  interval not displayed. Cardiac Enzymes:  Recent Labs Lab 07/05/14 1327 07/05/14 1830 07/06/14 0130  TROPONINI <0.30 <0.30 <0.30   CBG:  Recent Labs Lab 07/07/14 1727 07/07/14 2255 07/08/14 0104 07/08/14 0459 07/08/14 0540 07/08/14 0809  GLUCAP 128* 86 104* 65* 115* 68*   Urinalysis:  Recent Labs Lab 07/05/14 1129  COLORURINE YELLOW  LABSPEC 1.014  PHURINE 8.5*  GLUCOSEU NEGATIVE  HGBUR SMALL*  BILIRUBINUR NEGATIVE  KETONESUR NEGATIVE  PROTEINUR NEGATIVE  UROBILINOGEN 0.2  NITRITE NEGATIVE  LEUKOCYTESUR LARGE*    Micro Results: Recent Results (from the past 240 hour(s))  Blood Culture (routine x 2)     Status: None (Preliminary result)   Collection Time: 07/05/14 10:12 AM  Result Value Ref Range Status   Specimen Description BLOOD RIGHT ARM  Final   Special Requests BOTTLES DRAWN AEROBIC AND ANAEROBIC 4CC  Final   Culture  Setup Time   Final    07/05/2014 18:59 Performed at [facility]    Culture   Final           BLOOD CULTURE RECEIVED NO GROWTH TO DATE CULTURE WILL BE HELD FOR 5 DAYS BEFORE ISSUING A FINAL NEGATIVE REPORT Performed at [facility]    Report Status PENDING  Incomplete  Blood  Culture (routine x 2)     Status: None (Preliminary result)   Collection Time: 07/05/14 10:40 AM  Result Value Ref Range Status   Specimen Description BLOOD RIGHT ARM  Final   Special Requests BOTTLES DRAWN AEROBIC AND ANAEROBIC 10CC  Final   Culture  Setup Time   Final    07/05/2014 18:59 Performed at [facility]    Culture   Final           BLOOD CULTURE RECEIVED NO GROWTH TO DATE CULTURE WILL BE HELD FOR 5 DAYS BEFORE ISSUING A FINAL NEGATIVE REPORT Performed at [facility]    Report Status PENDING  Incomplete  Urine culture     Status: None   Collection Time: 07/05/14 11:29 AM  Result Value Ref Range Status   Specimen Description URINE, CATHETERIZED  Final   Special Requests NONE  Final   Culture  Setup Time   Final     07/05/2014 14:52 Performed at [facility]    Colony Count   Final    95,000 COLONIES/ML Performed at [facility]    Culture   Final    Multiple bacterial morphotypes present, none predominant. Suggest appropriate recollection if clinically indicated. Performed at [facility]    Report Status 07/06/2014 FINAL  Final  MRSA PCR Screening     Status: Abnormal   Collection Time: 07/05/14  3:06 PM  Result Value Ref Range Status   MRSA by PCR POSITIVE (A) NEGATIVE Final    Comment:        The GeneXpert MRSA Assay (FDA approved for NASAL specimens only), is one component of a comprehensive MRSA colonization surveillance program. It is not intended to diagnose MRSA infection nor to guide or monitor treatment for MRSA infections. RESULT CALLED TO, READ BACK BY AND VERIFIED WITH: JOHNSON,L RN 07/05/14 1753 WOOTEN,K    Studies/Results: No results found. Medications: I have reviewed the patient's current medications. Scheduled Meds: . antiseptic oral rinse  7 mL Mouth Rinse BID  . baclofen  10 mg Per Tube TID  . Chlorhexidine Gluconate Cloth  6 each Topical Q0600  . collagenase   Topical Daily  . famotidine  20 mg Per Tube BID  . feeding supplement (JEVITY 1.2 CAL)  330 mL Per Tube QID  . feeding supplement (PRO-STAT SUGAR FREE 64)  30 mL Per Tube Q1500  . ferrous sulfate  300 mg Per Tube BID WC  . free water  225 mL Per Tube QID  . heparin  5,000 Units Subcutaneous 3 times per day  . levofloxacin (LEVAQUIN) IV  750 mg Intravenous Q48H  . mupirocin ointment  1 application Nasal BID  . vitamin C  500 mg Per Tube Daily   Continuous Infusions: . dextrose 90 mL/hr at 07/07/14 0437   PRN Meds:.acetaminophen **OR** acetaminophen, sodium chloride Assessment/Plan: Active Problems:   UTI (lower urinary tract infection)   Alzheimer's dementia   Severe protein-calorie malnutrition   Decubitus ulcer of coccygeal region, stage 4   Acute renal failure    Hypernatremia   Acute hypernatremia   Acute kidney injury   Fever of undetermined origin  Fever 2/2 UTI: Afebrile over last 24 hours. No leukocytosis. Foley changed yesterday. UCx with [id number], no predominant organisms. BCx NGTD. -PO levofloxacin 750mg  Q48hr for 5 days (last dose Dec 25)  Hypernatremia: Receives 225ml free water QID via G tube. Resolved -d/c D5W -Continue home water flush 225ml QID  AKI on CKD stage 2:  Cr 1.26 at admission. Baseline 0.75. Cr wnl this morning. Resolved. -Continue home water flush QID -Continue to monitor  Hypertension: On metoprolol 75mg  BID and amlodipine 10mg  daily. -Restart home metoprolol 75mg  BID and amlodpine 10mg  daily.  Chronic anemia: Stable. Baseline 9-10 -Continue home Ferrous sulfate 330mg  BID  Chronic wounds: Wound care consulted. Appreciate recs. Sacrum with chronic stage 4 wound which was debrided in 7/15 by [facility]. 8x2x.8cm with undermining to .5 cm, bone palpable. Left lateral ankle with stage 4 wound: 3x1.5x2cm; bone palpable. Right lateral ankle with unstageable wound: 3x2.5x.2cm. -Aquacel to sacrum and cover with foam dressing.Foam dressing to left lateral ankle.Santyl for chemical debridement of nonviable tissue to right lateral ankle. -Continue home Baclofen 10mg  TID -Continue home oxycodone 5mg  Q6hr prn pain -Continue home Vitamin C 500mg  daily  FEN:  -NPO -Jevity 1.2 QID per g tube -Water flush QID per g tube -Prostat 30mL daily -Continue home Pepcid 20mg  BID  VTE ppx: subq hep 5000u QID  Dispo: Likely discharge today to SNF.  The patient does have a current PCP Jackie Plum, MD) and does not need an [facility] hospital follow-up appointment after discharge.  The patient does not have transportation limitations that hinder transportation to clinic appointments.  .Services Needed at time of discharge: Y = Yes, Blank = No PT:   OT:   RN:   Equipment:   Other:     LOS: 3 days   Griffin Basil, MD    07/08/2014, 9:36 AM

## 2014-07-08 NOTE — Discharge Summary (Signed)
Name: Michelle Hooper MRN: [id number] DOB: [birth year] 80 y.o. PCP: Michelle Plum, MD  Date of Admission: 07/05/2014  8:16 AM Date of Discharge: 07/08/2014 Attending Physician: Michelle Lagos, MD  Discharge Diagnosis: Active Problems:   UTI (lower urinary tract infection)   Alzheimer's dementia   Severe protein-calorie malnutrition   Decubitus ulcer of coccygeal region, stage 4   Acute renal failure   Hypernatremia   Acute hypernatremia   Acute kidney injury   Fever of undetermined origin  Discharge Medications:   Medication List    STOP taking these medications        antiseptic oral rinse Liqd     chlorhexidine 0.12 % solution  Commonly known as:  PERIDEX     cloNIDine 0.2 mg/24hr patch  Commonly known as:  CATAPRES - Dosed in mg/24 hr     dextrose 5 % solution     enoxaparin 30 MG/0.3ML injection  Commonly known as:  LOVENOX     fentaNYL 0.05 MG/ML injection  Commonly known as:  SUBLIMAZE     hydrALAZINE 50 MG tablet  Commonly known as:  APRESOLINE     MULTI VITAMIN DAILY PO     piperacillin-tazobactam 3.375 GM/50ML IVPB  Commonly known as:  ZOSYN     sodium chloride 0.9 % injection      TAKE these medications        amLODipine 10 MG tablet  Commonly known as:  NORVASC  Place 1 tablet (10 mg total) into feeding tube daily.     baclofen 10 MG tablet  Commonly known as:  LIORESAL  Give 10 mg by tube 3 (three) times daily.     bisacodyl 10 MG suppository  Commonly known as:  DULCOLAX  Place 1 suppository (10 mg total) rectally daily as needed for moderate constipation.     collagenase ointment  Commonly known as:  SANTYL  Apply topically daily.     famotidine 20 MG tablet  Commonly known as:  PEPCID  Give 20 mg by tube 2 (two) times daily.     feeding supplement (JEVITY 1.2 CAL) Liqd  Place 330 mLs into feeding tube 4 (four) times daily.     feeding supplement (PRO-STAT SUGAR FREE 64) Liqd  Place 30 mLs into feeding tube daily at  3 pm.     ferrous sulfate 220 (44 FE) MG/5ML solution  Place 330 mg into feeding tube 2 (two) times daily with a meal.     free water Soln  Place 225 mLs into feeding tube 4 (four) times daily.     lactobacillus acidophilus Tabs tablet  Place 1 tablet into feeding tube 2 (two) times daily.     levofloxacin 750 MG tablet  Commonly known as:  LEVAQUIN  Place 1 tablet (750 mg total) into feeding tube every other day.  Start taking on:  07/09/2014     metoprolol 50 MG tablet  Commonly known as:  LOPRESSOR  Place 1.5 tablets (75 mg total) into feeding tube 2 (two) times daily.     oxyCODONE 5 MG immediate release tablet  Commonly known as:  Oxy IR/ROXICODONE  Give 5 mg by tube every 6 (six) hours as needed for severe pain.     vitamin C 500 MG tablet  Commonly known as:  ASCORBIC ACID  Give 500 mg by tube daily.        Disposition and follow-up:   Ms.Michelle Hooper was discharged from [facility] in Stable condition.  At the  hospital follow up visit please address:  1.  UTI: Chronic indwelling catheter was excahged. Discharged on Levaquin 750mg  Q48hr for 5 day course. Hypernatremia and AKI on CKD stage 2: was treated with D5W infusion. Resolved at discharge. Continued home water flushes of QID. Will need this rechecked. Chronic wounds: seen by wound care nurse here. Added Prostat per nutrition to promote wound healing. Started Santyl for chemical debridement of nonviable tissue on R lateral ankle wound.  2.  Labs / imaging needed at time of follow-up: BMP  3.  Pending labs/ test needing follow-up: Urine culture from 07/08/2014  Follow-up Appointments:     Follow-up Information    Follow up with Hooper,[name], MD. Schedule an appointment as soon as possible for a visit in 1 week.   Specialty:  Internal Medicine   Why:  for hospital follow up   Contact information:   [address] [place] [address] [phone]       Discharge  Instructions: Discharge Instructions    Call MD for:  difficulty breathing, headache or visual disturbances    Complete by:  As directed      Call MD for:  persistant dizziness or light-headedness    Complete by:  As directed      Call MD for:  persistant nausea and vomiting    Complete by:  As directed      Call MD for:  severe uncontrolled pain    Complete by:  As directed      Call MD for:  temperature >100.4    Complete by:  As directed      Diet - low sodium heart healthy    Complete by:  As directed      Increase activity slowly    Complete by:  As directed            Procedures Performed:  Dg Chest 1 View  07/05/2014   CLINICAL DATA:  Stage IV sacral wound.  Fever  EXAM: CHEST - 1 VIEW  COMPARISON:  01/30/2014  FINDINGS: There is elevation of the left diaphragm. There is bilateral mild interstitial prominence. There is no focal parenchymal opacity, pleural effusion, or pneumothorax. There is stable cardiomegaly. There is thoracic aortic atherosclerosis.  The osseous structures are unremarkable.  IMPRESSION: 1. No acute cardiopulmonary disease. 2. Stable cardiomegaly.   Electronically Signed   By: Elige Ko   On: 07/05/2014 09:39    Admission HPI: Ms. Michelle Hooper is an 80 year old woman with history of HTN, Alzheimer's dementia, iron deficiency anemia, stage IV sacral decubitus ulcer, frequent UTI with chronic indwelling catheter, dysphagia s/p g tube presenting with fever. She is a resident at [facility] nursing facility. Pt is nonverbal. Per ED note and RN Michelle Hooper, the patient had a fever to 101.8 this morning. She was given Tylenol 650mg  x 1. UA from her chronic indwelling foley on 12/18 had small leuk esterase, rare bacteria, WBC 3-6 and she was started on cipro 500mg  BID. She has ulcers on bilateral lateral ankles, and L lateral mid foot.  Hospital Course by problem list: Active Problems:   UTI (lower urinary tract infection)   Alzheimer's dementia   Severe  protein-calorie malnutrition   Decubitus ulcer of coccygeal region, stage 4   Acute renal failure   Hypernatremia   Acute hypernatremia   Acute kidney injury   Fever of undetermined origin   1. UTI: Had a fever to 101.8 prior to arrival. UA from her chronic indwelling foley  on 12/17 had small leuk esterase, rare bacteria, WBC 3-6. In the ED her T max was 99.9. No tachycardia. No leukocytosis. Does not meet SIRS criteria. Ulcers appear clean and without signs of infection. CXR with no acute cardiopulmonary disease. UA at admission with large leukocytes, negative nitrite, 21-50 WBC, few bacteria. She was admitted and started on vancomycin and Zosyn. Her chronic indwelling foley was exchangedon 07/06/2014. Her urine culture grew 95k colonies with no predominant organisms. Blood culture had no growth to date. She was transitioned to PO levofloxacin 750mg  Q48hr for 5 day course (last dose Dec 25). At discharge she remained afebrile with no leukocytosis.  2. Hypernatremia: Receives 225ml free water QID via G tube. Likely chronic and may be 2/2 inadequate free water. Sodium was 153. Corrected at max 10 meq/L per day. She had an initial 1.6L free water deficit. She was started on D5W infusion and continued on home water flushes 225ml QID. At discharge her hypernatremia had resolved.  3. AKI on CKD stage 2: Cr 1.26 at admission. Baseline 0.75. After infusion of D5W and continuation of home water flushes, her cr trended downwards to wnl.  4. Hypertension: On metoprolol 75mg  BID and amlodipine 10mg  daily. Normotensive during hospitalization. She was continued on her home metoprolol 75mg  BID and amlodpine 10mg  daily at discharge.  5. Chronic wounds: Wound care consulted. Sacrum with chronic stage 4 wound which was debrided in 7/15 by [facility]. 8x2x.8cm with undermining to .5 cm, bone palpable. Left lateral ankle with stage 4 wound: 3x1.5x2cm; bone palpable. Right lateral ankle with unstageable wound: 3x2.5x.2cm. All  wounds did not appear to be infected (no surrounding erythema or purulent drainage). Continued home Baclofen 10mg  TID, home oxycodone 5mg  Q6hr prn pain, and home Vitamin C 500mg  daily. Wound dressing changes included Aquacel to sacrum and cover with foam dressing.Foam dressing to left lateral ankle.Santyl for chemical debridement of nonviable tissue to right lateral ankle. She was started on Prostat 30mL daily to aid in wound healing.  6. Dysphagia with G tube: She remained NPO and was given Jevity 1.2 330mL QID per g tube, Water flush 225mL QID per g tube, Prostat 30mL daily, and her home Pepcid 20mg  BID.  Discharge Vitals:   BP 119/80 mmHg  Pulse 118  Temp(Src) 98.6 F (37 C) (Oral)  Resp 15  Ht 4\' 11"  (1.499 m)  Wt 120 lb 12.8 oz (54.795 kg)  BMI 24.39 kg/m2  SpO2 100%  Discharge Labs:  Results for orders placed or performed during the hospital encounter of 07/05/14 (from the past 24 hour(s))  Glucose, capillary     Status: None   Collection Time: 07/07/14 11:47 AM  Result Value Ref Range   Glucose-Capillary 82 70 - 99 mg/dL  Glucose, capillary     Status: Abnormal   Collection Time: 07/07/14  5:27 PM  Result Value Ref Range   Glucose-Capillary 128 (H) 70 - 99 mg/dL  Glucose, capillary     Status: None   Collection Time: 07/07/14 10:55 PM  Result Value Ref Range   Glucose-Capillary 86 70 - 99 mg/dL   Comment 1 Notify RN    Comment 2 Documented in Chart   Glucose, capillary     Status: Abnormal   Collection Time: 07/08/14  1:04 AM  Result Value Ref Range   Glucose-Capillary 104 (H) 70 - 99 mg/dL   Comment 1 Notify RN    Comment 2 Documented in Chart   Glucose, capillary     Status: Abnormal  Collection Time: 07/08/14  4:59 AM  Result Value Ref Range   Glucose-Capillary 65 (L) 70 - 99 mg/dL  Glucose, capillary     Status: Abnormal   Collection Time: 07/08/14  5:40 AM  Result Value Ref Range   Glucose-Capillary 115 (H) 70 - 99 mg/dL  Basic metabolic panel     Status:  Abnormal   Collection Time: 07/08/14  6:00 AM  Result Value Ref Range   Sodium 139 135 - 145 mmol/L   Potassium 4.9 3.5 - 5.1 mmol/L   Chloride 107 96 - 112 mEq/L   CO2 25 19 - 32 mmol/L   Glucose, Bld 95 70 - 99 mg/dL   BUN 33 (H) 6 - 23 mg/dL   Creatinine, Ser 1.610.97 0.50 - 1.10 mg/dL   Calcium 8.0 (L) 8.4 - 10.5 mg/dL   GFR calc non Af Amer 51 (L) >90 mL/min   GFR calc Af Amer 59 (L) >90 mL/min   Anion gap 7 5 - 15  CBC     Status: Abnormal   Collection Time: 07/08/14  6:00 AM  Result Value Ref Range   WBC 5.6 4.0 - 10.5 K/uL   RBC 3.97 3.87 - 5.11 MIL/uL   Hemoglobin 9.4 (L) 12.0 - 15.0 g/dL   HCT 09.629.7 (L) 04.536.0 - 40.946.0 %   MCV 74.8 (L) 78.0 - 100.0 fL   MCH 23.7 (L) 26.0 - 34.0 pg   MCHC 31.6 30.0 - 36.0 g/dL   RDW 81.116.0 (H) 91.411.5 - 78.215.5 %   Platelets 182 150 - 400 K/uL  Glucose, capillary     Status: Abnormal   Collection Time: 07/08/14  8:09 AM  Result Value Ref Range   Glucose-Capillary 68 (L) 70 - 99 mg/dL  Glucose, capillary     Status: Abnormal   Collection Time: 07/08/14  9:44 AM  Result Value Ref Range   Glucose-Capillary 112 (H) 70 - 99 mg/dL    Signed: Griffin BasilJennifer Krall, MD 07/08/2014, 10:54 AM    Services Ordered on Discharge: None Equipment Ordered on Discharge: None

## 2014-07-09 LAB — URINE CULTURE
COLONY COUNT: NO GROWTH
CULTURE: NO GROWTH

## 2014-07-11 LAB — CULTURE, BLOOD (ROUTINE X 2)
CULTURE: NO GROWTH
Culture: NO GROWTH

## 2014-09-22 ENCOUNTER — Inpatient Hospital Stay (HOSPITAL_COMMUNITY)
Admission: EM | Admit: 2014-09-22 | Discharge: 2014-10-17 | DRG: 871 | Disposition: E | Payer: Medicare Other | Attending: Internal Medicine | Admitting: Internal Medicine

## 2014-09-22 ENCOUNTER — Other Ambulatory Visit (HOSPITAL_COMMUNITY): Payer: Self-pay

## 2014-09-22 ENCOUNTER — Encounter (HOSPITAL_COMMUNITY): Payer: Self-pay | Admitting: Emergency Medicine

## 2014-09-22 ENCOUNTER — Emergency Department (HOSPITAL_COMMUNITY): Payer: Medicare Other

## 2014-09-22 DIAGNOSIS — J690 Pneumonitis due to inhalation of food and vomit: Secondary | ICD-10-CM | POA: Diagnosis present

## 2014-09-22 DIAGNOSIS — N183 Chronic kidney disease, stage 3 unspecified: Secondary | ICD-10-CM | POA: Diagnosis present

## 2014-09-22 DIAGNOSIS — Z66 Do not resuscitate: Secondary | ICD-10-CM | POA: Diagnosis present

## 2014-09-22 DIAGNOSIS — R6521 Severe sepsis with septic shock: Secondary | ICD-10-CM | POA: Diagnosis present

## 2014-09-22 DIAGNOSIS — F0280 Dementia in other diseases classified elsewhere without behavioral disturbance: Secondary | ICD-10-CM | POA: Diagnosis present

## 2014-09-22 DIAGNOSIS — E860 Dehydration: Secondary | ICD-10-CM | POA: Diagnosis present

## 2014-09-22 DIAGNOSIS — Z931 Gastrostomy status: Secondary | ICD-10-CM

## 2014-09-22 DIAGNOSIS — D72829 Elevated white blood cell count, unspecified: Secondary | ICD-10-CM

## 2014-09-22 DIAGNOSIS — R059 Cough, unspecified: Secondary | ICD-10-CM

## 2014-09-22 DIAGNOSIS — R05 Cough: Secondary | ICD-10-CM

## 2014-09-22 DIAGNOSIS — R0600 Dyspnea, unspecified: Secondary | ICD-10-CM

## 2014-09-22 DIAGNOSIS — R1310 Dysphagia, unspecified: Secondary | ICD-10-CM | POA: Diagnosis present

## 2014-09-22 DIAGNOSIS — J189 Pneumonia, unspecified organism: Secondary | ICD-10-CM

## 2014-09-22 DIAGNOSIS — E870 Hyperosmolality and hypernatremia: Secondary | ICD-10-CM

## 2014-09-22 DIAGNOSIS — I129 Hypertensive chronic kidney disease with stage 1 through stage 4 chronic kidney disease, or unspecified chronic kidney disease: Secondary | ICD-10-CM | POA: Diagnosis present

## 2014-09-22 DIAGNOSIS — G309 Alzheimer's disease, unspecified: Secondary | ICD-10-CM | POA: Diagnosis present

## 2014-09-22 DIAGNOSIS — R4182 Altered mental status, unspecified: Secondary | ICD-10-CM | POA: Diagnosis present

## 2014-09-22 DIAGNOSIS — I10 Essential (primary) hypertension: Secondary | ICD-10-CM

## 2014-09-22 DIAGNOSIS — L89154 Pressure ulcer of sacral region, stage 4: Secondary | ICD-10-CM | POA: Diagnosis present

## 2014-09-22 DIAGNOSIS — L89524 Pressure ulcer of left ankle, stage 4: Secondary | ICD-10-CM | POA: Diagnosis present

## 2014-09-22 DIAGNOSIS — Z515 Encounter for palliative care: Secondary | ICD-10-CM | POA: Diagnosis not present

## 2014-09-22 DIAGNOSIS — N179 Acute kidney failure, unspecified: Secondary | ICD-10-CM

## 2014-09-22 DIAGNOSIS — A419 Sepsis, unspecified organism: Principal | ICD-10-CM

## 2014-09-22 DIAGNOSIS — D509 Iron deficiency anemia, unspecified: Secondary | ICD-10-CM | POA: Diagnosis present

## 2014-09-22 DIAGNOSIS — E875 Hyperkalemia: Secondary | ICD-10-CM | POA: Diagnosis present

## 2014-09-22 DIAGNOSIS — R6520 Severe sepsis without septic shock: Secondary | ICD-10-CM

## 2014-09-22 DIAGNOSIS — R0689 Other abnormalities of breathing: Secondary | ICD-10-CM

## 2014-09-22 DIAGNOSIS — L89514 Pressure ulcer of right ankle, stage 4: Secondary | ICD-10-CM | POA: Diagnosis present

## 2014-09-22 DIAGNOSIS — N19 Unspecified kidney failure: Secondary | ICD-10-CM

## 2014-09-22 LAB — COMPREHENSIVE METABOLIC PANEL
ALK PHOS: 90 U/L (ref 39–117)
ALT: 20 U/L (ref 0–35)
AST: 41 U/L — ABNORMAL HIGH (ref 0–37)
Albumin: 2 g/dL — ABNORMAL LOW (ref 3.5–5.2)
Anion gap: 7 (ref 5–15)
BILIRUBIN TOTAL: 0.7 mg/dL (ref 0.3–1.2)
BUN: 211 mg/dL — ABNORMAL HIGH (ref 6–23)
CHLORIDE: 120 mmol/L — AB (ref 96–112)
CO2: 27 mmol/L (ref 19–32)
Calcium: 9.1 mg/dL (ref 8.4–10.5)
Creatinine, Ser: 2.77 mg/dL — ABNORMAL HIGH (ref 0.50–1.10)
GFR calc Af Amer: 17 mL/min — ABNORMAL LOW (ref >90)
GFR calc non Af Amer: 14 mL/min — ABNORMAL LOW (ref >90)
GLUCOSE: 156 mg/dL — AB (ref 70–99)
POTASSIUM: 6.3 mmol/L — AB (ref 3.5–5.1)
Sodium: 154 mmol/L — ABNORMAL HIGH (ref 135–145)
Total Protein: 7.4 g/dL (ref 6.0–8.3)

## 2014-09-22 LAB — URINE MICROSCOPIC-ADD ON

## 2014-09-22 LAB — TSH: TSH: 3.108 u[IU]/mL (ref 0.350–4.500)

## 2014-09-22 LAB — URINALYSIS, ROUTINE W REFLEX MICROSCOPIC
BILIRUBIN URINE: NEGATIVE
GLUCOSE, UA: NEGATIVE mg/dL
Ketones, ur: NEGATIVE mg/dL
Nitrite: NEGATIVE
PH: 6.5 (ref 5.0–8.0)
PROTEIN: 30 mg/dL — AB
SPECIFIC GRAVITY, URINE: 1.014 (ref 1.005–1.030)
Urobilinogen, UA: 0.2 mg/dL (ref 0.0–1.0)

## 2014-09-22 LAB — I-STAT CG4 LACTIC ACID, ED
LACTIC ACID, VENOUS: 3.33 mmol/L — AB (ref 0.5–2.0)
Lactic Acid, Venous: 3.16 mmol/L (ref 0.5–2.0)
Lactic Acid, Venous: 2.11 mmol/L (ref 0.5–2.0)

## 2014-09-22 LAB — CBG MONITORING, ED
GLUCOSE-CAPILLARY: 143 mg/dL — AB (ref 70–99)
Glucose-Capillary: 117 mg/dL — ABNORMAL HIGH (ref 70–99)

## 2014-09-22 LAB — CBC
HEMATOCRIT: 26.6 % — AB (ref 36.0–46.0)
HEMOGLOBIN: 8.1 g/dL — AB (ref 12.0–15.0)
MCH: 24.3 pg — ABNORMAL LOW (ref 26.0–34.0)
MCHC: 30.5 g/dL (ref 30.0–36.0)
MCV: 79.9 fL (ref 78.0–100.0)
Platelets: 137 10*3/uL — ABNORMAL LOW (ref 150–400)
RBC: 3.33 MIL/uL — AB (ref 3.87–5.11)
RDW: 16 % — ABNORMAL HIGH (ref 11.5–15.5)
WBC: 12.5 10*3/uL — ABNORMAL HIGH (ref 4.0–10.5)

## 2014-09-22 LAB — CBC WITH DIFFERENTIAL/PLATELET
BASOS ABS: 0 10*3/uL (ref 0.0–0.1)
Basophils Relative: 0 % (ref 0–1)
EOS ABS: 0.2 10*3/uL (ref 0.0–0.7)
Eosinophils Relative: 1 % (ref 0–5)
HEMATOCRIT: 29.1 % — AB (ref 36.0–46.0)
Hemoglobin: 8.9 g/dL — ABNORMAL LOW (ref 12.0–15.0)
LYMPHS ABS: 0.7 10*3/uL (ref 0.7–4.0)
LYMPHS PCT: 4 % — AB (ref 12–46)
MCH: 24.5 pg — ABNORMAL LOW (ref 26.0–34.0)
MCHC: 30.6 g/dL (ref 30.0–36.0)
MCV: 79.9 fL (ref 78.0–100.0)
Monocytes Absolute: 0.8 10*3/uL (ref 0.1–1.0)
Monocytes Relative: 5 % (ref 3–12)
NEUTROS ABS: 14.6 10*3/uL — AB (ref 1.7–7.7)
Neutrophils Relative %: 90 % — ABNORMAL HIGH (ref 43–77)
Platelets: 139 10*3/uL — ABNORMAL LOW (ref 150–400)
RBC: 3.64 MIL/uL — ABNORMAL LOW (ref 3.87–5.11)
RDW: 16 % — AB (ref 11.5–15.5)
WBC: 16.3 10*3/uL — ABNORMAL HIGH (ref 4.0–10.5)

## 2014-09-22 LAB — I-STAT TROPONIN, ED: TROPONIN I, POC: 0.13 ng/mL — AB (ref 0.00–0.08)

## 2014-09-22 LAB — TYPE AND SCREEN
ABO/RH(D): O POS
ANTIBODY SCREEN: NEGATIVE

## 2014-09-22 LAB — CREATININE, SERUM
Creatinine, Ser: 2.59 mg/dL — ABNORMAL HIGH (ref 0.50–1.10)
GFR calc Af Amer: 18 mL/min — ABNORMAL LOW (ref >90)
GFR calc non Af Amer: 15 mL/min — ABNORMAL LOW (ref >90)

## 2014-09-22 MED ORDER — PRO-STAT SUGAR FREE PO LIQD
30.0000 mL | Freq: Every day | ORAL | Status: DC
  Administered 2014-09-23: 30 mL
  Filled 2014-09-22: qty 30

## 2014-09-22 MED ORDER — ACETAMINOPHEN 650 MG RE SUPP
650.0000 mg | Freq: Once | RECTAL | Status: AC
Start: 2014-09-22 — End: 2014-09-22
  Administered 2014-09-22: 650 mg via RECTAL
  Filled 2014-09-22: qty 1

## 2014-09-22 MED ORDER — PIPERACILLIN-TAZOBACTAM 3.375 G IVPB 30 MIN
3.3750 g | Freq: Once | INTRAVENOUS | Status: AC
  Administered 2014-09-22: 3.375 g via INTRAVENOUS
  Filled 2014-09-22: qty 50

## 2014-09-22 MED ORDER — PIPERACILLIN-TAZOBACTAM IN DEX 2-0.25 GM/50ML IV SOLN
2.2500 g | Freq: Four times a day (QID) | INTRAVENOUS | Status: DC
  Filled 2014-09-22 (×2): qty 50

## 2014-09-22 MED ORDER — SODIUM CHLORIDE 0.9 % IJ SOLN
3.0000 mL | Freq: Two times a day (BID) | INTRAMUSCULAR | Status: DC
  Administered 2014-09-23 – 2014-09-25 (×4): 3 mL via INTRAVENOUS

## 2014-09-22 MED ORDER — INSULIN ASPART 100 UNIT/ML ~~LOC~~ SOLN
10.0000 [IU] | Freq: Once | SUBCUTANEOUS | Status: AC
  Administered 2014-09-22: 10 [IU] via INTRAVENOUS
  Filled 2014-09-22: qty 1

## 2014-09-22 MED ORDER — SODIUM CHLORIDE 0.9 % IV BOLUS (SEPSIS)
1000.0000 mL | INTRAVENOUS | Status: AC
  Administered 2014-09-22 (×2): 1000 mL via INTRAVENOUS

## 2014-09-22 MED ORDER — ONDANSETRON HCL 4 MG PO TABS: 4.0000 mg | ORAL_TABLET | Freq: Four times a day (QID) | ORAL | Status: DC | PRN

## 2014-09-22 MED ORDER — VITAMIN B-1 100 MG PO TABS
100.0000 mg | ORAL_TABLET | Freq: Every day | ORAL | Status: DC
  Administered 2014-09-23 – 2014-09-24 (×2): 100 mg via ORAL
  Filled 2014-09-22 (×2): qty 1

## 2014-09-22 MED ORDER — VANCOMYCIN HCL IN DEXTROSE 750-5 MG/150ML-% IV SOLN: 750.0000 mg | INTRAVENOUS | Status: DC

## 2014-09-22 MED ORDER — ONDANSETRON HCL 4 MG/2ML IJ SOLN: 4.0000 mg | Freq: Four times a day (QID) | INTRAMUSCULAR | Status: DC | PRN

## 2014-09-22 MED ORDER — HEPARIN SODIUM (PORCINE) 5000 UNIT/ML IJ SOLN
5000.0000 [IU] | Freq: Three times a day (TID) | INTRAMUSCULAR | Status: DC
  Administered 2014-09-23 – 2014-09-24 (×5): 5000 [IU] via SUBCUTANEOUS
  Filled 2014-09-22 (×7): qty 1

## 2014-09-22 MED ORDER — ADULT MULTIVITAMIN W/MINERALS CH
1.0000 | ORAL_TABLET | Freq: Every day | ORAL | Status: DC
  Administered 2014-09-23 – 2014-09-24 (×2): 1 via ORAL
  Filled 2014-09-22 (×2): qty 1

## 2014-09-22 MED ORDER — SODIUM POLYSTYRENE SULFONATE 15 GM/60ML PO SUSP
45.0000 g | Freq: Once | ORAL | Status: AC
  Administered 2014-09-22: 45 g via ORAL
  Filled 2014-09-22: qty 180

## 2014-09-22 MED ORDER — SODIUM CHLORIDE 0.9 % IV SOLN
1.0000 g | Freq: Once | INTRAVENOUS | Status: AC
  Administered 2014-09-22: 1 g via INTRAVENOUS
  Filled 2014-09-22: qty 10

## 2014-09-22 MED ORDER — FAMOTIDINE 20 MG PO TABS
20.0000 mg | ORAL_TABLET | Freq: Two times a day (BID) | ORAL | Status: DC
  Filled 2014-09-22 (×2): qty 1

## 2014-09-22 MED ORDER — BISACODYL 10 MG RE SUPP: 10.0000 mg | Freq: Every day | RECTAL | Status: DC | PRN

## 2014-09-22 MED ORDER — VANCOMYCIN HCL IN DEXTROSE 1-5 GM/200ML-% IV SOLN
1000.0000 mg | Freq: Once | INTRAVENOUS | Status: AC
  Administered 2014-09-22: 1000 mg via INTRAVENOUS
  Filled 2014-09-22: qty 200

## 2014-09-22 MED ORDER — FERROUS SULFATE 300 (60 FE) MG/5ML PO SYRP
300.0000 mg | ORAL_SOLUTION | Freq: Two times a day (BID) | ORAL | Status: DC
Start: 2014-09-23 — End: 2014-09-24
  Administered 2014-09-23 – 2014-09-24 (×3): 300 mg
  Filled 2014-09-22 (×5): qty 5

## 2014-09-22 MED ORDER — SODIUM CHLORIDE 0.9 % IV SOLN
INTRAVENOUS | Status: DC
  Administered 2014-09-23: 02:00:00 via INTRAVENOUS

## 2014-09-22 MED ORDER — JUVEN PO PACK
1.0000 | PACK | Freq: Two times a day (BID) | ORAL | Status: DC
  Administered 2014-09-23: 1 via ORAL
  Filled 2014-09-22 (×2): qty 1

## 2014-09-22 MED ORDER — ISOSOURCE 1.5 CAL PO LIQD: 330.0000 mL | Freq: Four times a day (QID) | ORAL | Status: DC

## 2014-09-22 MED ORDER — DEXTROSE 50 % IV SOLN
1.0000 | Freq: Once | INTRAVENOUS | Status: AC
  Administered 2014-09-22: 50 mL via INTRAVENOUS
  Filled 2014-09-22: qty 50

## 2014-09-22 MED ORDER — FOLIC ACID 1 MG PO TABS
1.0000 mg | ORAL_TABLET | Freq: Every day | ORAL | Status: DC
  Administered 2014-09-23 – 2014-09-24 (×2): 1 mg via ORAL
  Filled 2014-09-22 (×2): qty 1

## 2014-09-22 NOTE — ED Provider Notes (Signed)
CSN: [id number]     Arrival date & time 10/06/2014  1648 History   First MD Initiated Contact with Patient 10/01/2014 1700     Chief Complaint  Patient presents with  . Code Sepsis     (Consider location/radiation/quality/duration/timing/severity/associated sxs/prior Treatment) HPI  A LEVEL 5 CAVEAT PERTAINS DUE TO ALTERED MENTAL STATUS/DEMENTIA  Pt presents from nursing facility due to findings of hyperkalemia and hypernatremia,.  She is nonverbal at baseline and comes with the DNR paperwork.  Upon arrivla to the ED she is found to be hypotensive, febrile and hypoxic and tachypneic.   Past Medical History  Diagnosis Date  . SBO (small bowel obstruction)   . Hypertension   . Alzheimer's dementia   . Iron deficiency anemia   . Frequent UTI    Past Surgical History  Procedure Laterality Date  . Exploratory laparotomy, lysis of adhesions, small bowel  07/30/10  . Irrigation and debridement abscess N/A 01/21/2014    Procedure: IRRIGATION AND DEBRIDEMENT Sacral Decubitus Ulcer;  Surgeon: Axel Filler, MD;  Location: [facility] OR;  Service: General;  Laterality: N/A;   Family History  Problem Relation Age of Onset  . Cancer Son     Colon cancer  . Diabetes Son   . Diabetes Sister   . Hypertension Sister   . Stroke Sister    History  Substance Use Topics  . Smoking status: Never Smoker   . Smokeless tobacco: Never Used  . Alcohol Use: No   OB History    No data available     Review of Systems  UNABLE TO OBTAIN ROS DUE TO LEVEL 5 CAVEAT    Allergies  Review of patient's allergies indicates no known allergies.  Home Medications   Prior to Admission medications   Medication Sig Start Date End Date Taking? Authorizing Provider  Amino Acids-Protein Hydrolys (FEEDING SUPPLEMENT, PRO-STAT SUGAR FREE 64,) LIQD Place 30 mLs into feeding tube daily at 3 pm. 07/08/14  Yes Lora Paula, MD  amLODipine (NORVASC) 10 MG tablet Place 1 tablet (10 mg total) into feeding tube daily.  07/08/14  Yes Lora Paula, MD  baclofen (LIORESAL) 10 MG tablet Give 10 mg by tube 3 (three) times daily.   Yes Historical Provider, MD  bisacodyl (DULCOLAX) 10 MG suppository Place 1 suppository (10 mg total) rectally daily as needed for moderate constipation. 01/27/14  Yes Russella Dar, NP  famotidine (PEPCID) 20 MG tablet Give 20 mg by tube 2 (two) times daily.   Yes Historical Provider, MD  ferrous sulfate 220 (44 FE) MG/5ML solution Place 330 mg into feeding tube 2 (two) times daily with a meal.   Yes Historical Provider, MD  lactobacillus acidophilus & bulgar (LACTINEX) chewable tablet Give 1 tablet by tube 2 (two) times daily with a meal.   Yes Historical Provider, MD  metoprolol (LOPRESSOR) 50 MG tablet Place 1.5 tablets (75 mg total) into feeding tube 2 (two) times daily. 07/08/14  Yes Lora Paula, MD  nutrition supplement, JUVEN, (JUVEN) PACK Take 1 packet by mouth 2 (two) times daily between meals.   Yes Historical Provider, MD  Nutritional Supplements (ISOSOURCE 1.5 CAL) LIQD Take 330 mLs by mouth 4 (four) times daily.   Yes Historical Provider, MD  oxyCODONE (OXY IR/ROXICODONE) 5 MG immediate release tablet Place 1 tablet (5 mg total) into feeding tube every 6 (six) hours as needed for severe pain. 07/08/14  Yes Lora Paula, MD  vitamin C (ASCORBIC ACID) 500 MG tablet Give  500 mg by tube daily.   Yes Historical Provider, MD  Water For Irrigation, Sterile (FREE WATER) SOLN Place 225 mLs into feeding tube 4 (four) times daily. 07/08/14  Yes Lora PaulaJennifer T Krall, MD  collagenase (SANTYL) ointment Apply topically daily. 07/08/14   Lora PaulaJennifer T Krall, MD  Nutritional Supplements (FEEDING SUPPLEMENT, JEVITY 1.2 CAL,) LIQD Place 330 mLs into feeding tube 4 (four) times daily. 07/08/14   Lora PaulaJennifer T Krall, MD   BP 105/38 mmHg  Pulse 95  Temp(Src) 98.9 F (37.2 C) (Axillary)  Resp 23  Wt 121 lb 4.1 oz (55 kg)  SpO2 100%  Vitals reviewed Physical Exam  Physical Examination:  General appearance -chronically ill appearing Mental status - sleeping, arousable to voice Eyes - pupils equal and reactive, no scleral icterus, no conjunctival injection Mouth - mucous membranes dry Chest - BSS, tachypneic, bilateral course rhonchi Heart - normal rate, regular rhythm, normal S1, S2, no murmurs, rubs, clicks or gallops Abdomen - soft, nontender, nondistended, no masses or organomegaly Neurological - sleeping, nonverbal Extremities - peripheral pulses normal, no pedal edema, no clubbing or cyanosis Skin - normal coloration and turgor, no rashes  ED Course  Procedures (including critical care time)  CRITICAL CARE Performed by: Ethelda ChickLINKER,MARTHA K Total critical care time: 40 Critical care time was exclusive of separately billable procedures and treating other patients. Critical care was necessary to treat or prevent imminent or life-threatening deterioration. Critical care was time spent personally by me on the following activities: development of treatment plan with patient and/or surrogate as well as nursing, discussions with consultants, evaluation of patient's response to treatment, examination of patient, obtaining history from patient or surrogate, ordering and performing treatments and interventions, ordering and review of laboratory studies, ordering and review of radiographic studies, pulse oximetry and re-evaluation of patient's condition.  6:58 PM d/w Dr. Welton FlakesKhan, triad hospitalist, he will see patient in the ED.  Labs Review Labs Reviewed  MRSA PCR SCREENING - Abnormal; Notable for the following:    MRSA by PCR POSITIVE (*)    All other components within normal limits  CBC WITH DIFFERENTIAL/PLATELET - Abnormal; Notable for the following:    WBC 16.3 (*)    RBC 3.64 (*)    Hemoglobin 8.9 (*)    HCT 29.1 (*)    MCH 24.5 (*)    RDW 16.0 (*)    Platelets 139 (*)    Neutrophils Relative % 90 (*)    Lymphocytes Relative 4 (*)    Neutro Abs 14.6 (*)    All other  components within normal limits  COMPREHENSIVE METABOLIC PANEL - Abnormal; Notable for the following:    Sodium 154 (*)    Potassium 6.3 (*)    Chloride 120 (*)    Glucose, Bld 156 (*)    BUN 211 (*)    Creatinine, Ser 2.77 (*)    Albumin 2.0 (*)    AST 41 (*)    GFR calc non Af Amer 14 (*)    GFR calc Af Amer 17 (*)    All other components within normal limits  URINALYSIS, ROUTINE W REFLEX MICROSCOPIC - Abnormal; Notable for the following:    Hgb urine dipstick MODERATE (*)    Protein, ur 30 (*)    Leukocytes, UA LARGE (*)    All other components within normal limits  URINE MICROSCOPIC-ADD ON - Abnormal; Notable for the following:    Bacteria, UA FEW (*)    All other components within normal limits  CBC - Abnormal; Notable for the following:    WBC 12.5 (*)    RBC 3.33 (*)    Hemoglobin 8.1 (*)    HCT 26.6 (*)    MCH 24.3 (*)    RDW 16.0 (*)    Platelets 137 (*)    All other components within normal limits  CREATININE, SERUM - Abnormal; Notable for the following:    Creatinine, Ser 2.59 (*)    GFR calc non Af Amer 15 (*)    GFR calc Af Amer 18 (*)    All other components within normal limits  COMPREHENSIVE METABOLIC PANEL - Abnormal; Notable for the following:    Sodium 162 (*)    Chloride 128 (*)    Glucose, Bld 109 (*)    BUN 179 (*)    Creatinine, Ser 2.40 (*)    Albumin 1.6 (*)    GFR calc non Af Amer 17 (*)    GFR calc Af Amer 20 (*)    All other components within normal limits  CBC - Abnormal; Notable for the following:    WBC 11.9 (*)    RBC 3.16 (*)    Hemoglobin 7.8 (*)    HCT 25.5 (*)    MCH 24.7 (*)    RDW 16.1 (*)    Platelets 105 (*)    All other components within normal limits  GLUCOSE, CAPILLARY - Abnormal; Notable for the following:    Glucose-Capillary 112 (*)    All other components within normal limits  SODIUM - Abnormal; Notable for the following:    Sodium 166 (*)    All other components within normal limits  I-STAT CG4 LACTIC ACID, ED  - Abnormal; Notable for the following:    Lactic Acid, Venous 3.16 (*)    All other components within normal limits  CBG MONITORING, ED - Abnormal; Notable for the following:    Glucose-Capillary 117 (*)    All other components within normal limits  I-STAT TROPOININ, ED - Abnormal; Notable for the following:    Troponin i, poc 0.13 (*)    All other components within normal limits  I-STAT CG4 LACTIC ACID, ED - Abnormal; Notable for the following:    Lactic Acid, Venous 3.33 (*)    All other components within normal limits  CBG MONITORING, ED - Abnormal; Notable for the following:    Glucose-Capillary 143 (*)    All other components within normal limits  I-STAT CG4 LACTIC ACID, ED - Abnormal; Notable for the following:    Lactic Acid, Venous 2.11 (*)    All other components within normal limits  CULTURE, BLOOD (ROUTINE X 2)  CULTURE, BLOOD (ROUTINE X 2)  URINE CULTURE  TSH  HEMOGLOBIN A1C  OCCULT BLOOD X 1 CARD TO LAB, STOOL  SODIUM  SODIUM  TYPE AND SCREEN    Imaging Review Dg Chest Port 1 View  10/08/14   CLINICAL DATA:  Code sepsis.  History of urinary tract infection.  EXAM: PORTABLE CHEST - 1 VIEW  COMPARISON:  07/05/2014  FINDINGS: Mild cardiac enlargement with borderline pulmonary vascular congestion. Perihilar infiltration greater on the left. This may be due to edema or pneumonia. No blunting of costophrenic angles. No pneumothorax. Calcified aorta. Degenerative changes in the spine and shoulders. Thoracic scoliosis convex towards the right. Surgical clips and gastrostomy type tube in the upper abdomen.  IMPRESSION: Cardiac enlargement with mild pulmonary vascular congestion. Perihilar infiltration could indicate edema or pneumonia.   Electronically Signed  By: Burman Nieves M.D.   On: 10/05/2014 17:50     EKG Interpretation   Date/Time:  Monday September 22 2014 17:04:32 EST Ventricular Rate:  84 PR Interval:  118 QRS Duration: 93 QT Interval:  367 QTC Calculation:  434 R Axis:   -38 Text Interpretation:  Sinus rhythm Borderline short PR interval Abnormal  R-wave progression, late transition LVH with secondary repolarization  abnormality Left anterior fasicular block st changes in inferior leads No  significant change since last tracing Confirmed by [facility]  MD, MARTHA  [phone]) on 09/16/2014 5:08:50 PM      MDM   Final diagnoses:  Severe sepsis  HCAP (healthcare-associated pneumonia)  Hypernatremia  Renal failure  Leukocytosis  Hyperkalemia    Pt presenting with fever, hypotension.  Code sepsis initiated with broad spectrum abx.  Blood pressure responded to fluids.  Labs reveal hypernatremia, hyperkalemia- treated with d50, insulin, kayexalate.  Pt admitted to triad to step down unit.     Jerelyn Scott, MD 09/23/14 [phone]

## 2014-09-22 NOTE — ED Notes (Signed)
Spoke with MD patient o2 88 labored  on NRB . Md stated patient is DNR unable to place on BIPAP.

## 2014-09-22 NOTE — ED Notes (Signed)
Spoke with Pharmacy about order of medication. Advised give Calcium, then Dextrose, Then insulin.

## 2014-09-22 NOTE — ED Notes (Signed)
[facility] Potasium 6.8  and Sodium 156 is elevated. Patient is non verbal. Vitals :  96/58 TEmp 101.6 on arrivial 106/51

## 2014-09-22 NOTE — H&P (Signed)
[facility] History and Physical  Letonya Mangels WJX:[id number] DOB: 1925-01-25 DOA: Oct 18, 2014  Referring physician: Jerelyn Scott, MD PCP: Jackie Plum, MD   Chief Complaint: Sepsis  HPI: Michelle Hooper is a 81 y.o. female presents with sepsis. Patient is not able to provide a history as she is unresponsive. She is brought to the ED by EMS from a SNF for hyperkalemia. Patient was evaluated here and noted to be hypotensive. A code sepsis was called and started on antibiotics to include Zosyn and Vancomycin. Patient was noted here to be hypotensive so was given fluid boluses with some good response. She also was noted to have an elevated lactate. Her urine does not look particularly dirty. Patient has a chronic feeding tube present. She is also on multiple hypertensive medications. At baseline she has Dementia and the patient is a DNR.   Review of Systems:  Patient is unable to provide a ROS  Past Medical History  Diagnosis Date  . SBO (small bowel obstruction)   . Hypertension   . Alzheimer's dementia   . Iron deficiency anemia   . Frequent UTI    Past Surgical History  Procedure Laterality Date  . Exploratory laparotomy, lysis of adhesions, small bowel  07/30/10  . Irrigation and debridement abscess N/A 01/21/2014    Procedure: IRRIGATION AND DEBRIDEMENT Sacral Decubitus Ulcer;  Surgeon: Axel Filler, MD;  Location: [facility] OR;  Service: General;  Laterality: N/A;   Social History:  reports that she has never smoked. She has never used smokeless tobacco. She reports that she does not drink alcohol. Her drug history is not on file.  No Known Allergies  Family History  Problem Relation Age of Onset  . Cancer Son     Colon cancer  . Diabetes Son   . Diabetes Sister   . Hypertension Sister   . Stroke Sister      Prior to Admission medications   Medication Sig Start Date End Date Taking? Authorizing Provider  Amino Acids-Protein Hydrolys (FEEDING SUPPLEMENT, PRO-STAT  SUGAR FREE 64,) LIQD Place 30 mLs into feeding tube daily at 3 pm. 07/08/14  Yes Lora Paula, MD  amLODipine (NORVASC) 10 MG tablet Place 1 tablet (10 mg total) into feeding tube daily. 07/08/14  Yes Lora Paula, MD  baclofen (LIORESAL) 10 MG tablet Give 10 mg by tube 3 (three) times daily.   Yes Historical Provider, MD  bisacodyl (DULCOLAX) 10 MG suppository Place 1 suppository (10 mg total) rectally daily as needed for moderate constipation. 01/27/14  Yes Russella Dar, NP  famotidine (PEPCID) 20 MG tablet Give 20 mg by tube 2 (two) times daily.   Yes Historical Provider, MD  ferrous sulfate 220 (44 FE) MG/5ML solution Place 330 mg into feeding tube 2 (two) times daily with a meal.   Yes Historical Provider, MD  lactobacillus acidophilus & bulgar (LACTINEX) chewable tablet Give 1 tablet by tube 2 (two) times daily with a meal.   Yes Historical Provider, MD  metoprolol (LOPRESSOR) 50 MG tablet Place 1.5 tablets (75 mg total) into feeding tube 2 (two) times daily. 07/08/14  Yes Lora Paula, MD  nutrition supplement, JUVEN, (JUVEN) PACK Take 1 packet by mouth 2 (two) times daily between meals.   Yes Historical Provider, MD  Nutritional Supplements (ISOSOURCE 1.5 CAL) LIQD Take 330 mLs by mouth 4 (four) times daily.   Yes Historical Provider, MD  oxyCODONE (OXY IR/ROXICODONE) 5 MG immediate release tablet Place 1 tablet (5 mg total) into feeding tube  every 6 (six) hours as needed for severe pain. 07/08/14  Yes Lora PaulaJennifer T Krall, MD  vitamin C (ASCORBIC ACID) 500 MG tablet Give 500 mg by tube daily.   Yes Historical Provider, MD  Water For Irrigation, Sterile (FREE WATER) SOLN Place 225 mLs into feeding tube 4 (four) times daily. 07/08/14  Yes Lora PaulaJennifer T Krall, MD  collagenase (SANTYL) ointment Apply topically daily. 07/08/14   Lora PaulaJennifer T Krall, MD  Nutritional Supplements (FEEDING SUPPLEMENT, JEVITY 1.2 CAL,) LIQD Place 330 mLs into feeding tube 4 (four) times daily. 07/08/14   Lora PaulaJennifer T  Krall, MD   Physical Exam: Filed Vitals:   10/13/2014 1702 10/04/2014 1715 09/21/2014 1800 09/21/2014 1845  BP: 106/51 119/51 102/59 97/40  Pulse: 83  52 84  Temp: 101.2 F (38.4 C)     Resp: 35 31 29 31   SpO2: 100%  96% 90%    Wt Readings from Last 3 Encounters:  07/08/14 54.795 kg (120 lb 12.8 oz)  01/23/14 49.7 kg (109 lb 9.1 oz)  09/18/13 56.246 kg (124 lb)    General:  Nonverbal unresponsive Neck: no LAD, masses or thyromegaly Cardiovascular: RRR, no m/r/g. Respiratory: very  Coarse with obvious guppy breathing on 100% NRB oxygen. Abdomen: soft, ntnd Neurologic: Unable to assess Psych: unable to assess.          Labs on Admission:  Basic Metabolic Panel:  Recent Labs Lab 10/01/2014 1707  NA 154*  K 6.3*  CL 120*  CO2 27  GLUCOSE 156*  BUN 211*  CREATININE 2.77*  CALCIUM 9.1   Liver Function Tests:  Recent Labs Lab 10/08/2014 1707  AST 41*  ALT 20  ALKPHOS 90  BILITOT 0.7  PROT 7.4  ALBUMIN 2.0*   No results for input(s): LIPASE, AMYLASE in the last 168 hours. No results for input(s): AMMONIA in the last 168 hours. CBC:  Recent Labs Lab 09/16/2014 1707  WBC 16.3*  NEUTROABS 14.6*  HGB 8.9*  HCT 29.1*  MCV 79.9  PLT 139*   Cardiac Enzymes: No results for input(s): CKTOTAL, CKMB, CKMBINDEX, TROPONINI in the last 168 hours.  BNP (last 3 results) No results for input(s): BNP in the last 8760 hours.  ProBNP (last 3 results) No results for input(s): PROBNP in the last 8760 hours.  CBG:  Recent Labs Lab 10/14/2014 1705  GLUCAP 117*    Radiological Exams on Admission: Dg Chest Port 1 View  10/14/2014   CLINICAL DATA:  Code sepsis.  History of urinary tract infection.  EXAM: PORTABLE CHEST - 1 VIEW  COMPARISON:  07/05/2014  FINDINGS: Mild cardiac enlargement with borderline pulmonary vascular congestion. Perihilar infiltration greater on the left. This may be due to edema or pneumonia. No blunting of costophrenic angles. No pneumothorax. Calcified  aorta. Degenerative changes in the spine and shoulders. Thoracic scoliosis convex towards the right. Surgical clips and gastrostomy type tube in the upper abdomen.  IMPRESSION: Cardiac enlargement with mild pulmonary vascular congestion. Perihilar infiltration could indicate edema or pneumonia.   Electronically Signed   By: Burman NievesWilliam  Stevens M.D.   On: 09/23/2014 17:50     Assessment/Plan Principal Problem:   Sepsis Active Problems:   HTN (hypertension)   CKD (chronic kidney disease), stage III   Acute hypernatremia   Acute kidney injury   Hyperkalemia   1. Sepsis -code sepsis called -cultures done -started on vancomycin and zosyn -will hydrate accordingly  2. HYPOTENSION -hold all antihypertensive medications -will monitor pressures  -fluid resucitation started in ED -she  is DNR NO PRESSORS  3. Acute Kidney Injury -likely from sepsis and hypotension -will hydrate and follow labs  4. Hypernatremia -started on fluids monitor labs  5. Hyperkalemia -kayexalate given in ED along with Insulin -will monitor labs   Patient is Critically ill and in danger of cardiac arrest. She is a DNR and no CPR intubation or pressors to be used. Will monitor in stepDown unit. I had a extensive discussion with the daughter at the bedside. I told her that her mother may not survive. She states that her mother has suffered enough and does not want aggressive measures such as BIPAP and CPR or intubation but is OK right now with fluids and antibiotics to try to help her.  Code Status: DNR (must indicate code status--if unknown or must be presumed, indicate so) DVT Prophylaxis:Heparin Family Communication: Daughter [name] (indicate person spoken with, if applicable, with phone number if by telephone) Disposition Plan: SNF (indicate anticipated LOS)  Time spent: Critical Care time  [facility] Pager [phone]

## 2014-09-22 NOTE — Progress Notes (Signed)
ANTIBIOTIC CONSULT NOTE - INITIAL  Pharmacy Consult for vancomycin + zosyn Indication: rule out sepsis  No Known Allergies  Patient Measurements:   Adjusted Body Weight:   Vital Signs: Temp: 101.2 F (38.4 C) (03/07 1702) BP: 119/51 mmHg (03/07 1715) Pulse Rate: 83 (03/07 1702) Intake/Output from previous day:   Intake/Output from this shift:    Labs:  Recent Labs  April 10, 2015 1707  WBC 16.3*  HGB 8.9*  PLT 139*  CREATININE 2.77*   CrCl cannot be calculated (Unknown ideal weight.). No results for input(s): VANCOTROUGH, VANCOPEAK, VANCORANDOM, GENTTROUGH, GENTPEAK, GENTRANDOM, TOBRATROUGH, TOBRAPEAK, TOBRARND, AMIKACINPEAK, AMIKACINTROU, AMIKACIN in the last 72 hours.   Microbiology: No results found for this or any previous visit (from the past 720 hour(s)).  Medical History: Past Medical History  Diagnosis Date  . SBO (small bowel obstruction)   . Hypertension   . Alzheimer's dementia   . Iron deficiency anemia   . Frequent UTI     Medications:  Anti-infectives    Start     Dose/Rate Route Frequency Ordered Stop   09/24/14 1800  vancomycin (VANCOCIN) IVPB 750 mg/150 ml premix     750 mg 150 mL/hr over 60 Minutes Intravenous Every 48 hours April 10, 2015 1832     09/23/14 0000  piperacillin-tazobactam (ZOSYN) IVPB 2.25 g     2.25 g 100 mL/hr over 30 Minutes Intravenous 4 times per day April 10, 2015 1832     April 10, 2015 1715  vancomycin (VANCOCIN) IVPB 1000 mg/200 mL premix     1,000 mg 200 mL/hr over 60 Minutes Intravenous  Once April 10, 2015 1711     April 10, 2015 1715  piperacillin-tazobactam (ZOSYN) IVPB 3.375 g     3.375 g 100 mL/hr over 30 Minutes Intravenous  Once April 10, 2015 1711       Assessment: 89 yof presented to the ED from a nursing home with sepsis. To start broad-spectrum antibiotics with zosyn and vancomycin. Pt with a Tmax of 101.2 and WBC is elevated at 16.3. Scr is also elevated at 2.77.   Vanc 3/7>> Zosyn 3/7>>  Goal of Therapy:  Vancomycin trough level  15-20 mcg/ml  Plan:  1. Vancomycin 1gm IV x 1 then 750mg  IV Q48H 2. Zosyn 3.375gm IV x 1 then 2.25gm IV Q6H 3. F/u renal fxn, C&S, [facility]  Leonore Frankson, Drake Leachachel Lynn 09/23/2014,6:33 PM

## 2014-09-22 NOTE — Progress Notes (Signed)
Called to get report to the ED. Was transferred to RN but no answer. I will call back in 5 mins.

## 2014-09-22 NOTE — ED Notes (Signed)
MD linker given EKG and notfited patient meets sepsis  no recommendations at this time

## 2014-09-23 DIAGNOSIS — J690 Pneumonitis due to inhalation of food and vomit: Secondary | ICD-10-CM

## 2014-09-23 LAB — GLUCOSE, CAPILLARY: Glucose-Capillary: 112 mg/dL — ABNORMAL HIGH (ref 70–99)

## 2014-09-23 LAB — COMPREHENSIVE METABOLIC PANEL
ALBUMIN: 1.6 g/dL — AB (ref 3.5–5.2)
ALK PHOS: 80 U/L (ref 39–117)
ALT: 19 U/L (ref 0–35)
AST: 35 U/L (ref 0–37)
Anion gap: 8 (ref 5–15)
BILIRUBIN TOTAL: UNDETERMINED mg/dL (ref 0.3–1.2)
BUN: 179 mg/dL — ABNORMAL HIGH (ref 6–23)
CHLORIDE: 128 mmol/L — AB (ref 96–112)
CO2: 26 mmol/L (ref 19–32)
Calcium: 8.5 mg/dL (ref 8.4–10.5)
Creatinine, Ser: 2.4 mg/dL — ABNORMAL HIGH (ref 0.50–1.10)
GFR calc Af Amer: 20 mL/min — ABNORMAL LOW (ref >90)
GFR, EST NON AFRICAN AMERICAN: 17 mL/min — AB (ref >90)
Glucose, Bld: 109 mg/dL — ABNORMAL HIGH (ref 70–99)
POTASSIUM: 4.6 mmol/L (ref 3.5–5.1)
Sodium: 162 mmol/L (ref 135–145)
Total Protein: 6 g/dL (ref 6.0–8.3)

## 2014-09-23 LAB — SODIUM
SODIUM: 166 mmol/L — AB (ref 135–145)
Sodium: 168 mmol/L (ref 135–145)
Sodium: 167 mmol/L (ref 135–145)
Sodium: 168 mmol/L (ref 135–145)

## 2014-09-23 LAB — CBC
HEMATOCRIT: 25.5 % — AB (ref 36.0–46.0)
Hemoglobin: 7.8 g/dL — ABNORMAL LOW (ref 12.0–15.0)
MCH: 24.7 pg — ABNORMAL LOW (ref 26.0–34.0)
MCHC: 30.6 g/dL (ref 30.0–36.0)
MCV: 80.7 fL (ref 78.0–100.0)
PLATELETS: 105 10*3/uL — AB (ref 150–400)
RBC: 3.16 MIL/uL — ABNORMAL LOW (ref 3.87–5.11)
RDW: 16.1 % — AB (ref 11.5–15.5)
WBC: 11.9 10*3/uL — AB (ref 4.0–10.5)

## 2014-09-23 LAB — MRSA PCR SCREENING: MRSA BY PCR: POSITIVE — AB

## 2014-09-23 MED ORDER — CETYLPYRIDINIUM CHLORIDE 0.05 % MT LIQD
7.0000 mL | Freq: Two times a day (BID) | OROMUCOSAL | Status: DC
  Administered 2014-09-23 – 2014-09-24 (×4): 7 mL via OROMUCOSAL

## 2014-09-23 MED ORDER — JEVITY 1.2 CAL PO LIQD
237.0000 mL | Freq: Four times a day (QID) | ORAL | Status: DC
  Filled 2014-09-23 (×6): qty 237

## 2014-09-23 MED ORDER — FAMOTIDINE 20 MG PO TABS
20.0000 mg | ORAL_TABLET | Freq: Every day | ORAL | Status: DC
  Administered 2014-09-23 – 2014-09-24 (×2): 20 mg via ORAL
  Filled 2014-09-23 (×3): qty 1

## 2014-09-23 MED ORDER — DEXTROSE 5 % IV SOLN
INTRAVENOUS | Status: DC
Start: 2014-09-23 — End: 2014-09-24
  Administered 2014-09-23 – 2014-09-24 (×2): via INTRAVENOUS

## 2014-09-23 MED ORDER — FREE WATER
225.0000 mL | Freq: Four times a day (QID) | Status: DC
  Administered 2014-09-23: 225 mL

## 2014-09-23 MED ORDER — MUPIROCIN 2 % EX OINT
1.0000 "application " | TOPICAL_OINTMENT | Freq: Two times a day (BID) | CUTANEOUS | Status: DC
  Administered 2014-09-23 – 2014-09-25 (×5): 1 via NASAL
  Filled 2014-09-23 (×2): qty 22

## 2014-09-23 MED ORDER — SODIUM CHLORIDE 0.9 % IV BOLUS (SEPSIS)
500.0000 mL | Freq: Once | INTRAVENOUS | Status: AC
  Administered 2014-09-23: 500 mL via INTRAVENOUS

## 2014-09-23 MED ORDER — PIPERACILLIN-TAZOBACTAM IN DEX 2-0.25 GM/50ML IV SOLN
2.2500 g | Freq: Four times a day (QID) | INTRAVENOUS | Status: DC
  Administered 2014-09-23 – 2014-09-24 (×5): 2.25 g via INTRAVENOUS
  Filled 2014-09-23 (×8): qty 50

## 2014-09-23 MED ORDER — VANCOMYCIN HCL IN DEXTROSE 750-5 MG/150ML-% IV SOLN
750.0000 mg | INTRAVENOUS | Status: DC
  Filled 2014-09-23: qty 150

## 2014-09-23 MED ORDER — ACETAMINOPHEN 650 MG RE SUPP
650.0000 mg | Freq: Four times a day (QID) | RECTAL | Status: DC | PRN
  Administered 2014-09-24 (×2): 650 mg via RECTAL
  Filled 2014-09-23 (×2): qty 1

## 2014-09-23 MED ORDER — SODIUM CHLORIDE 0.45 % IV SOLN
INTRAVENOUS | Status: DC
  Administered 2014-09-23: 06:00:00 via INTRAVENOUS

## 2014-09-23 MED ORDER — DEXTROSE-NACL 5-0.45 % IV SOLN
INTRAVENOUS | Status: DC
  Administered 2014-09-23: 13:00:00 via INTRAVENOUS

## 2014-09-23 MED ORDER — CHLORHEXIDINE GLUCONATE 0.12 % MT SOLN
15.0000 mL | Freq: Two times a day (BID) | OROMUCOSAL | Status: DC
  Administered 2014-09-23 – 2014-09-25 (×4): 15 mL via OROMUCOSAL
  Filled 2014-09-23 (×7): qty 15

## 2014-09-23 MED ORDER — CHLORHEXIDINE GLUCONATE CLOTH 2 % EX PADS
6.0000 | MEDICATED_PAD | Freq: Every day | CUTANEOUS | Status: DC
  Administered 2014-09-24 – 2014-09-25 (×2): 6 via TOPICAL

## 2014-09-23 NOTE — Progress Notes (Signed)
CRITICAL VALUE ALERT  Critical value received:  MRSA positive nasal swab  Date of notification:  09/23/2014   Time of notification:  0237  Critical value read back: yes  Nurse who received alert:  Birdena CrandallAmanda Quintavius Niebuhr RN  MD notified (1st page):    Time of first page:    MD notified (2nd page):  Time of second page:  Responding MD:    Time MD responded:    Comment: Orders placed per protocol

## 2014-09-23 NOTE — Progress Notes (Signed)
INITIAL NUTRITION ASSESSMENT  DOCUMENTATION CODES Per approved criteria  -Not Applicable   INTERVENTION: Recommend the following TF regimen when medically ready: 300 ml of Jevity 1.2 QID with 30 ml Pro-stat once daily. This will provide 1540 kcal (103% of estimated needs), 82 grams of protein, and 972 ml of water.   Recommend 130 ml free water flushes QID when tube feedings are re-initiated.   Discontinue Juven BID until sepsis resolves  NUTRITION DIAGNOSIS: Inadequate oral intake related to inability to eat as evidenced by g-tube dependence.   Goal: Pt to meet >/= 90% of their estimated nutrition needs   Monitor:  TF initiation/tolerance, weight trend, labs, I/O's   Reason for Assessment: New TF and Low Braden Score 81 y.o. female  Admitting Dx: Sepsis  ASSESSMENT: 81 y.o. female presents with sepsis. Patient is not able to provide a history as she is unresponsive. She is brought to the ED by EMS from a SNF for hyperkalemia.   Pt asleep on non-re breather mask at time of visit. No family present. Per chart reviewed patient's home tube feeding regimen is 330 ml of Jevity 1.2 QID with 30 ml Pro-stat once daily and 225 ml of free water QID. This provides 1684 kcal, 88 grams of protein, and 1969 ml of water. Weight appears stable over the past 3 months.   Labs: high sodium, low hemoglobin, high BUN, low GFR  Height: Ht Readings from Last 1 Encounters:  07/05/14 4\' 11"  (1.499 m)    Weight: Wt Readings from Last 1 Encounters:  09/23/14 121 lb 4.1 oz (55 kg)    Ideal Body Weight: 98 lbs  % Ideal Body Weight: 123%  Wt Readings from Last 10 Encounters:  09/23/14 121 lb 4.1 oz (55 kg)  07/08/14 120 lb 12.8 oz (54.795 kg)  01/23/14 109 lb 9.1 oz (49.7 kg)  09/18/13 124 lb (56.246 kg)    Usual Body Weight: 124 lbs  % Usual Body Weight: 98%  BMI:  Body mass index is 24.48 kg/(m^2).  Estimated Nutritional Needs: Kcal: 1300-1500 Protein: 70-85 grams Fluid: 1.5  L/day  Skin: stage 4 pressure ulcers to sacrum and ankles; +1 generalized edema  Diet Order: Diet NPO time specified  EDUCATION NEEDS: -No education needs identified at this time   Intake/Output Summary (Last 24 hours) at 09/23/14 1319 Last data filed at 09/23/14 0937  Gross per 24 hour  Intake 223.75 ml  Output    950 ml  Net -726.25 ml    Last BM: PTA   Labs:   Recent Labs Lab 01-30-15 1707 01-30-15 2105 09/23/14 0403  NA 154*  --  162*  K 6.3*  --  4.6  CL 120*  --  128*  CO2 27  --  26  BUN 211*  --  179*  CREATININE 2.77* 2.59* 2.40*  CALCIUM 9.1  --  8.5  GLUCOSE 156*  --  109*    CBG (last 3)   Recent Labs  01-30-15 1705 01-30-15 2049 09/23/14 0800  GLUCAP 117* 143* 112*    Scheduled Meds: . antiseptic oral rinse  7 mL Mouth Rinse q12n4p  . chlorhexidine  15 mL Mouth Rinse BID  . Chlorhexidine Gluconate Cloth  6 each Topical Q0600  . famotidine  20 mg Oral Daily  . ferrous sulfate  300 mg Per Tube BID WC  . folic acid  1 mg Oral Daily  . free water  225 mL Per Tube QID  . heparin  5,000 Units Subcutaneous  3 times per day  . multivitamin with minerals  1 tablet Oral Daily  . mupirocin ointment  1 application Nasal BID  . nutrition supplement (JUVEN)  1 packet Oral BID BM  . piperacillin-tazobactam (ZOSYN)  IV  2.25 g Intravenous Q6H  . sodium chloride  3 mL Intravenous Q12H  . thiamine  100 mg Oral Daily  . [START ON 09/24/2014] vancomycin  750 mg Intravenous Q48H    Continuous Infusions: . dextrose 5 % and 0.45% NaCl 100 mL/hr at 09/23/14 1239    Past Medical History  Diagnosis Date  . SBO (small bowel obstruction)   . Hypertension   . Alzheimer's dementia   . Iron deficiency anemia   . Frequent UTI     Past Surgical History  Procedure Laterality Date  . Exploratory laparotomy, lysis of adhesions, small bowel  07/30/10  . Irrigation and debridement abscess N/A 01/21/2014    Procedure: IRRIGATION AND DEBRIDEMENT Sacral Decubitus  Ulcer;  Surgeon: Axel Filler, MD;  Location: [facility] OR;  Service: General;  Laterality: N/A;    Ian Malkin RD, LDN Inpatient Clinical Dietitian Pager: [phone] After Hours Pager: [phone]

## 2014-09-23 NOTE — Progress Notes (Signed)
CRITICAL VALUE ALERT  Critical value received:  Na 167  Date of notification:  09/23/14  Time of notification:  2035  Critical value read back:  YES  Nurse who received alert:  Janice NorrieMisty Ennis  MD notified (1st page):  Craige CottaKirby, NP  Time of first page:  2119  MD notified (2nd page):  Time of second page:  Responding MD:    Time MD responded:

## 2014-09-23 NOTE — Progress Notes (Signed)
CRITICAL VALUE ALERT  Critical value received:  Na 162  Date of notification:  09/23/14   Time of notification:  545  Critical value read back: yes  Nurse who received alert:  Caralee AtesJason Delontae Lamm  MD notified (1st page):  Craige CottaKirby, NP  Time of first page:  600  MD notified (2nd page):  Time of second page:  Responding MD:  Craige CottaKirby, NP  Time MD responded:  615  Orders received.

## 2014-09-23 NOTE — ED Notes (Addendum)
Paged Dr. Welton FlakesKhan about MAP of 59. Dr. Welton FlakesKhan informed of Lactic acid 2.11

## 2014-09-23 NOTE — Progress Notes (Signed)
[facility] PROGRESS NOTE  Michelle Hooper LDJ:[id number] DOB: 07/20/1924 DOA: 10/14/2014 PCP: Benito Mccreedy, MD  Assessment/Plan: 1. Sepsis with suspected aspiration pneumonia 1. Imaging suggestive of PNA 2. Nursing staff reports suctioning out tube feed from airway around time of admission 3. Have stopped tube feeds for now 4. Pt is continued on vanc and zosyn 5. Overall, leukocytosis is improving, although all blood lines have shown a decrease - developing pancytopenia from sepsis? 6. Pt remains afebrile 7. Poor prognosis 2. Septic shock 1. BP stable 2. Cont IVF as tolerated 3. ARF 1. Cr has shown slight improvement 2. BUN of 211 on admit, 179 currently 3. Cont IVF as tolerated 4. Hypernatremia 1. Worsening despite 1/2 NS fluids 2. Will change to D5 W and monitor Na closely 3. Suspect related to acute septic picture/dehydration 5. Hyperkalemia 1. Normalized 6. DVT prophylaxis 1. Heparin subQ 7. Dementia 1. Stable 8. Multiple pressure ulcers 1. Sacral stage 4, stage 4 at left ankle, right ankle stage 4 2. WOC consulted 9. End of Life 1. Met with pt's daughter at bedside to discuss care. Daughter is very much aware of grim prognosis and that her mother may pass at any moment. She is concerned about patient's level of discomfort and wanting "her to be comfortable." Pt's daughter is, however, overwhelmed with the severity of her mother's illness. In fact, she has lost many close family members including both of her daughters in recent years and wishes to continue the plan as outlined above for now. Will have Palliative Care follow up.  Code Status: DNR/DNI Family Communication: Daughter at bedside (indicate person spoken with, relationship, and if by phone, the number) Disposition Plan: pending   Consultants:  Palliative Care  Procedures:    Antibiotics:  Vancomycin 3/7>>>  Zosyn 3/7>>> (indicate start date, and stop date if known)  HPI/Subjective: No  acute events noted overnight   Objective: Filed Vitals:   09/23/14 0600 09/23/14 0700 09/23/14 1324 09/23/14 1558  BP: 108/46 104/48 104/43 105/38  Pulse: 95 95    Temp:  99.7 F (37.6 C) 99.6 F (37.6 C) 98.9 F (37.2 C)  TempSrc:  Axillary Axillary Axillary  Resp: 25 17 22 23   Weight:  55 kg (121 lb 4.1 oz)    SpO2: 100% 100%      Intake/Output Summary (Last 24 hours) at 09/23/14 1711 Last data filed at 09/23/14 1239  Gross per 24 hour  Intake 498.75 ml  Output    950 ml  Net -451.25 ml   Filed Weights   09/23/14 0700  Weight: 55 kg (121 lb 4.1 oz)    Exam:   General:  Laying in bed, mild resp distress, not conversant  Cardiovascular: regular, s1, s2  Respiratory: normal resp effort, no wheezing  Abdomen: soft,nondistended  Musculoskeletal: perfused, no clubbing   Data Reviewed: Basic Metabolic Panel:  Recent Labs Lab 10/08/2014 1707 10/08/2014 2105 09/23/14 0403 09/23/14 1240  NA 154*  --  162* 166*  K 6.3*  --  4.6  --   CL 120*  --  128*  --   CO2 27  --  26  --   GLUCOSE 156*  --  109*  --   BUN 211*  --  179*  --   CREATININE 2.77* 2.59* 2.40*  --   CALCIUM 9.1  --  8.5  --    Liver Function Tests:  Recent Labs Lab 10/16/2014 1707 09/23/14 0403  AST 41* 35  ALT 20 19  ALKPHOS 90  80  BILITOT 0.7 QUANTITY NOT SUFFICIENT, UNABLE TO PERFORM TEST  PROT 7.4 6.0  ALBUMIN 2.0* 1.6*   No results for input(s): LIPASE, AMYLASE in the last 168 hours. No results for input(s): AMMONIA in the last 168 hours. CBC:  Recent Labs Lab 10/03/2014 1707 09/28/2014 2105 09/23/14 0731  WBC 16.3* 12.5* 11.9*  NEUTROABS 14.6*  --   --   HGB 8.9* 8.1* 7.8*  HCT 29.1* 26.6* 25.5*  MCV 79.9 79.9 80.7  PLT 139* 137* 105*   Cardiac Enzymes: No results for input(s): CKTOTAL, CKMB, CKMBINDEX, TROPONINI in the last 168 hours. BNP (last 3 results) No results for input(s): BNP in the last 8760 hours.  ProBNP (last 3 results) No results for input(s): PROBNP in  the last 8760 hours.  CBG:  Recent Labs Lab 10/07/2014 1705 10/08/2014 2049 09/23/14 0800  GLUCAP 117* 143* 112*    Recent Results (from the past 240 hour(s))  MRSA PCR Screening     Status: Abnormal   Collection Time: 09/23/14  1:10 AM  Result Value Ref Range Status   MRSA by PCR POSITIVE (A) NEGATIVE Final    Comment:        The GeneXpert MRSA Assay (FDA approved for NASAL specimens only), is one component of a comprehensive MRSA colonization surveillance program. It is not intended to diagnose MRSA infection nor to guide or monitor treatment for MRSA infections. RESULT CALLED TO, READ BACK BY AND VERIFIED WITH: PETTIFORD,A RN [id number] 09/23/14 MITCHELL,L      Studies: Dg Chest Port 1 View  09/30/2014   CLINICAL DATA:  Code sepsis.  History of urinary tract infection.  EXAM: PORTABLE CHEST - 1 VIEW  COMPARISON:  07/05/2014  FINDINGS: Mild cardiac enlargement with borderline pulmonary vascular congestion. Perihilar infiltration greater on the left. This may be due to edema or pneumonia. No blunting of costophrenic angles. No pneumothorax. Calcified aorta. Degenerative changes in the spine and shoulders. Thoracic scoliosis convex towards the right. Surgical clips and gastrostomy type tube in the upper abdomen.  IMPRESSION: Cardiac enlargement with mild pulmonary vascular congestion. Perihilar infiltration could indicate edema or pneumonia.   Electronically Signed   By: Lucienne Capers M.D.   On: 09/18/2014 17:50    Scheduled Meds: . antiseptic oral rinse  7 mL Mouth Rinse q12n4p  . chlorhexidine  15 mL Mouth Rinse BID  . Chlorhexidine Gluconate Cloth  6 each Topical Q0600  . famotidine  20 mg Oral Daily  . ferrous sulfate  300 mg Per Tube BID WC  . folic acid  1 mg Oral Daily  . free water  225 mL Per Tube QID  . heparin  5,000 Units Subcutaneous 3 times per day  . multivitamin with minerals  1 tablet Oral Daily  . mupirocin ointment  1 application Nasal BID  .  piperacillin-tazobactam (ZOSYN)  IV  2.25 g Intravenous Q6H  . sodium chloride  3 mL Intravenous Q12H  . thiamine  100 mg Oral Daily  . [START ON 09/24/2014] vancomycin  750 mg Intravenous Q48H   Continuous Infusions: . dextrose 100 mL/hr at 09/23/14 1552    Principal Problem:   Sepsis Active Problems:   HTN (hypertension)   CKD (chronic kidney disease), stage III   Acute hypernatremia   Acute kidney injury   Hyperkalemia    CHIU, STEPHEN K  [facility] Pager [phone]. If 7PM-7AM, please contact night-coverage at [email or web address], password [facility] 09/23/2014, 5:11 PM  LOS: 1 day

## 2014-09-23 NOTE — Progress Notes (Signed)
CRITICAL VALUE ALERT  Critical value received:  Na 166  Date of notification:  09/23/2014   Time of notification:  1450  Critical value read back:Yes.    Nurse who received alert:  Nicole CellaStephanie Shaw   MD notified (1st page):  Rhona Leavenshiu  Time of first page:  1527  MD notified (2nd page):  Time of second page:  Responding MD:  Rhona Leavenshiu  Time MD responded:  [id number]

## 2014-09-23 NOTE — Progress Notes (Signed)
CRITICAL VALUE ALERT  Critical value received:  Na 168  Date of notification:  09/23/2014  Time of notification:  1821  Critical value read back:Yes.    Nurse who received alert:  Elita Booneina Ayers  MD notified (1st page):  Rhona Leavenshiu  Time of first page:  1823  MD notified (2nd page):  Time of second page:  Responding MD:    Time MD responded:

## 2014-09-23 NOTE — Progress Notes (Signed)
Pt has what I assume is a stage 4 pressure sore. 8cm x 5cm and 2cm deep. Wet to dry dressing and foam pad was applied. NP Craige CottaKirby was notified.   Caralee AtesJason Osei Anger, RN

## 2014-09-23 NOTE — Consult Note (Addendum)
WOC wound consult note Reason for Consult: Consult requested for multiple wounds.  [facility]; refer to notes on 12/21. Sacrum with chronic stage 4 wound; bone visible, 8X5X.8cm, moist red wound bed, undermining to .5cm. Mod amt yellow drainage, no odor.   Left outer ankle stage 4 wound, bone palpable, 3X1.5X.2cm, 100% red, mod amt yellow drainage, no odor. Left outer foot with deep tissue injury; dark purple with intact skin 5X2.5cm Left inner foot with partial thickness wound; .2X.2X.1cm, pink and moist. Right calf with full thickness wound; 5X2.5X.2cm, 50% yellow, 50% red, small amt yellow drainage, no odor. Right outer ankle with stage 4 wound; bone palpable, 5X3.5X.2cm, 90% red, 10% yellow, mod amt yellow drainage, no odor. Pressure Ulcer POA: Yes Dressing procedure/placement/frequency: Alginate dressing to sacrum, right outer ankle, and left outer ankle to absorb drainage and promote healing. Moist gauze to right calf to assist with removal of nonviable tissue.  Foam dressing to other open areas to protect and promote healing.  Pt has heel-lift boots on to reduce pressure. No family at bedside to discuss plan of care. Please re-consult if further assistance is needed.  Thank-you,  Cammie Mcgeeawn Erna Brossard MSN, RN, CWOCN, [place] Arc[place]CN-AP, CNS [phone]

## 2014-09-23 NOTE — Clinical Social Work Note (Signed)
CS[facility] with [facility].  Per RN report- pt disposition is unsure at this time.  CSW will continue to follow.  Merlyn LotJenna Holoman, LCSWA Clinical Social Worker [phone]

## 2014-09-24 ENCOUNTER — Inpatient Hospital Stay (HOSPITAL_COMMUNITY): Payer: Medicare Other

## 2014-09-24 DIAGNOSIS — Z515 Encounter for palliative care: Secondary | ICD-10-CM

## 2014-09-24 DIAGNOSIS — R0689 Other abnormalities of breathing: Secondary | ICD-10-CM

## 2014-09-24 DIAGNOSIS — R0600 Dyspnea, unspecified: Secondary | ICD-10-CM

## 2014-09-24 LAB — CBC WITH DIFFERENTIAL/PLATELET
BASOS ABS: 0 10*3/uL (ref 0.0–0.1)
BASOS PCT: 1 % (ref 0–1)
Eosinophils Absolute: 0.2 10*3/uL (ref 0.0–0.7)
Eosinophils Relative: 2 % (ref 0–5)
HEMATOCRIT: 26.7 % — AB (ref 36.0–46.0)
HEMOGLOBIN: 7.9 g/dL — AB (ref 12.0–15.0)
LYMPHS PCT: 4 % — AB (ref 12–46)
Lymphs Abs: 0.3 10*3/uL — ABNORMAL LOW (ref 0.7–4.0)
MCH: 23.7 pg — AB (ref 26.0–34.0)
MCHC: 29.6 g/dL — AB (ref 30.0–36.0)
MCV: 79.9 fL (ref 78.0–100.0)
MONO ABS: 0.5 10*3/uL (ref 0.1–1.0)
Monocytes Relative: 6 % (ref 3–12)
NEUTROS PCT: 86 % — AB (ref 43–77)
Neutro Abs: 6.5 10*3/uL (ref 1.7–7.7)
PLATELETS: 145 10*3/uL — AB (ref 150–400)
RBC: 3.34 MIL/uL — AB (ref 3.87–5.11)
RDW: 16 % — AB (ref 11.5–15.5)
WBC: 7.5 10*3/uL (ref 4.0–10.5)

## 2014-09-24 LAB — COMPREHENSIVE METABOLIC PANEL
ALT: 21 U/L (ref 0–35)
AST: 44 U/L — ABNORMAL HIGH (ref 0–37)
Albumin: 1.7 g/dL — ABNORMAL LOW (ref 3.5–5.2)
Alkaline Phosphatase: 88 U/L (ref 39–117)
BUN: 116 mg/dL — ABNORMAL HIGH (ref 6–23)
CALCIUM: 8.1 mg/dL — AB (ref 8.4–10.5)
CO2: 25 mmol/L (ref 19–32)
CREATININE: 1.76 mg/dL — AB (ref 0.50–1.10)
GFR calc non Af Amer: 25 mL/min — ABNORMAL LOW (ref >90)
GFR, EST AFRICAN AMERICAN: 29 mL/min — AB (ref >90)
Glucose, Bld: 133 mg/dL — ABNORMAL HIGH (ref 70–99)
Potassium: 3.5 mmol/L (ref 3.5–5.1)
Sodium: 164 mmol/L (ref 135–145)
Total Bilirubin: 0.4 mg/dL (ref 0.3–1.2)
Total Protein: 6.4 g/dL (ref 6.0–8.3)

## 2014-09-24 LAB — SODIUM
SODIUM: 165 mmol/L — AB (ref 135–145)
SODIUM: 168 mmol/L — AB (ref 135–145)
Sodium: 169 mmol/L (ref 135–145)

## 2014-09-24 LAB — HEMOGLOBIN A1C
Hgb A1c MFr Bld: 5.6 % (ref 4.8–5.6)
MEAN PLASMA GLUCOSE: 114 mg/dL

## 2014-09-24 LAB — GLUCOSE, CAPILLARY: Glucose-Capillary: 145 mg/dL — ABNORMAL HIGH (ref 70–99)

## 2014-09-24 MED ORDER — ATROPINE SULFATE 1 % OP SOLN
4.0000 [drp] | OPHTHALMIC | Status: DC | PRN
  Administered 2014-09-25: 4 [drp] via SUBLINGUAL
  Filled 2014-09-24: qty 2

## 2014-09-24 MED ORDER — LORAZEPAM 0.5 MG PO TABS: 0.5000 mg | ORAL_TABLET | ORAL | Status: DC | PRN

## 2014-09-24 MED ORDER — FREE WATER: 300.0000 mL | Freq: Four times a day (QID) | Status: DC

## 2014-09-24 MED ORDER — FREE WATER: 75.0000 mL | Status: DC

## 2014-09-24 MED ORDER — FREE WATER: 50.0000 mL | Status: DC

## 2014-09-24 MED ORDER — MORPHINE SULFATE 2 MG/ML IJ SOLN
1.0000 mg | INTRAMUSCULAR | Status: DC | PRN
  Administered 2014-09-24: 1 mg via INTRAVENOUS
  Administered 2014-09-24 – 2014-09-25 (×9): 2 mg via INTRAVENOUS
  Filled 2014-09-24 (×11): qty 1

## 2014-09-24 NOTE — Progress Notes (Signed)
CRITICAL VALUE ALERT  Critical value received:  Na 164                                        Chloride >130  Date of notification:  09/24/14  Time of notification:  0600  Critical value read back: YES  Nurse who received alert:  Janice NorrieMisty Ennis  MD notified (1st page):  Craige CottaKirby, NP  Time of first page:  0630  MD notified (2nd page):  Time of second page:  Responding MD:    Time MD responded:

## 2014-09-24 NOTE — Progress Notes (Signed)
Utilization Review Completed.  

## 2014-09-24 NOTE — Progress Notes (Signed)
CRITICAL VALUE ALERT  Critical value received:  Na 169  Date of notification:  09/24/14  Time of notification:  0743  Critical value read back:Yes.    Nurse who received alert:  Nicole CellaStephanie Gabryella Murfin, RN  MD notified (1st page):  Thedore MinsSingh  Time of first page:  [phone]  MD notified (2nd page):  Time of second page:  Responding MD:  Thedore MinsSingh  Time MD responded:  [phone]

## 2014-09-24 NOTE — Consult Note (Signed)
Patient TM:[id number] Hooper      DOB: 1924-08-18      TWS:[id number]     Consult Note from the Palliative Medicine Team at [place] Requested by: Dr. Candiss Norse     PCP: Benito Mccreedy, MD Reason for Consultation: [place]     Phone Number:[phone]  Assessment of patients Current state: I met today with Ms. Michelle Hooper (although she is unresponsive) and her daughter, Michelle Hooper, at bedside. Michelle Hooper tells me that she hates to see her mother like this and hates to see her suffering. Michelle Hooper tells me about her 2 daughters that have died and she has been through a lot in her life. We discussed that her mother's body is shutting down and she is actively dying. Michelle Hooper acknowledges this and tells me that she knew this and she has watched her decline greatly over ~6 months. However, in her mind she was hoping she would be able to take her back home eventually. She acknowledges that there is "no sense to give medications that are not helping" - referring to antibiotics. With her permission I will add medications for comfort and limit po medications and d/c antibiotics. Communicated plan with nurse and Dr. Candiss Norse.    Goals of Care: 1.  Code Status: DNR  2. Disposition: Likely anticipate hospital death.    3. Symptom Management:   1. Anxiety/Agitation: Lorazepam 0.5 mg every 4 hours prn.  2. Pain/dyspnea: Morphine 1-2 mg every hour prn.  3. Bowel Regimen: Dulcolax supp daily prn.  4. Fever: Acetaminophen every 6 hours prn.  5. Nausea/Vomiting: Zofran every 6 hours prn.  6. Terminal Secretions: Atropine SL every 4 hours prn.   4. Psychosocial: Emotional support provided to patient and to daughter at bedside during very difficult time.   5. Spiritual: They are spiritual people and Michelle Hooper tells me that her mother would pick out her wig (she loved her wigs) and her matching suite, shoes, and purse and would be all dressed up for church every Sunday. They could benefit from chaplain  support.    Brief HPI: 81 y.o. female admitted with sepsis. Patient is not able to provide a history as she is unresponsive. She is brought to the ED by EMS from a SNF for hyperkalemia. Patient was evaluated here and noted to be hypotensive. A code sepsis was called and started on antibiotics to include Zosyn and Vancomycin. Patient was noted here to be hypotensive so was given fluid boluses with some good response. She also was noted to have an elevated lactate. Her urine does not look particularly dirty. Patient has a chronic feeding tube present. She is also on multiple hypertensive medications. At baseline she has Dementia and the patient is a DNR. She has not responded to therapies and we have not seen much improvement. She appears now to be actively dying. PMH reviewed below.    ROS: Unable to assess - unresponsive.     PMH:  Past Medical History  Diagnosis Date  . SBO (small bowel obstruction)   . Hypertension   . Alzheimer's dementia   . Iron deficiency anemia   . Frequent UTI      PSH: Past Surgical History  Procedure Laterality Date  . Exploratory laparotomy, lysis of adhesions, small bowel  07/30/10  . Irrigation and debridement abscess N/A 01/21/2014    Procedure: IRRIGATION AND DEBRIDEMENT Sacral Decubitus Ulcer;  Surgeon: Ralene Ok, MD;  Location: [place];  Service: General;  Laterality: N/A;   I have reviewed the  FH and SH and  If appropriate update it with new information. No Known Allergies Scheduled Meds: . antiseptic oral rinse  7 mL Mouth Rinse q12n4p  . chlorhexidine  15 mL Mouth Rinse BID  . Chlorhexidine Gluconate Cloth  6 each Topical Q0600  . famotidine  20 mg Oral Daily  . ferrous sulfate  300 mg Per Tube BID WC  . folic acid  1 mg Oral Daily  . heparin  5,000 Units Subcutaneous 3 times per day  . multivitamin with minerals  1 tablet Oral Daily  . mupirocin ointment  1 application Nasal BID  . piperacillin-tazobactam (ZOSYN)  IV  2.25 g Intravenous  Q6H  . sodium chloride  3 mL Intravenous Q12H  . thiamine  100 mg Oral Daily  . vancomycin  750 mg Intravenous Q48H   Continuous Infusions: . free water     PRN Meds:.acetaminophen, bisacodyl, ondansetron **OR** ondansetron (ZOFRAN) IV    BP 140/116 mmHg  Pulse 108  Temp(Src) 99 F (37.2 C) (Axillary)  Resp 29  Wt 55 kg (121 lb 4.1 oz)  SpO2 100%   PPS: 10%   Intake/Output Summary (Last 24 hours) at 09/24/14 1525 Last data filed at 09/24/14 1339  Gross per 24 hour  Intake 2797.59 ml  Output   3575 ml  Net -777.41 ml    Physical Exam:  General: Distressed, elderly, frail, unresponsive HEENT: Temporal muscle wasting, +JVD Chest: Labored breathing, Rhonchi and rales throughout  CVS: Tachycardic Abdomen: Soft, NT, ND Ext: No edema, warm to touch Neuro: Unresponsive  Labs: CBC    Component Value Date/Time   WBC 7.5 09/24/2014 0352   RBC 3.34* 09/24/2014 0352   HGB 7.9* 09/24/2014 0352   HCT 26.7* 09/24/2014 0352   PLT 145* 09/24/2014 0352   MCV 79.9 09/24/2014 0352   MCH 23.7* 09/24/2014 0352   MCHC 29.6* 09/24/2014 0352   RDW 16.0* 09/24/2014 0352   LYMPHSABS 0.3* 09/24/2014 0352   MONOABS 0.5 09/24/2014 0352   EOSABS 0.2 09/24/2014 0352   BASOSABS 0.0 09/24/2014 0352    BMET    Component Value Date/Time   NA 165* 09/24/2014 1013   K 3.5 09/24/2014 0352   CL >130* 09/24/2014 0352   CO2 25 09/24/2014 0352   GLUCOSE 133* 09/24/2014 0352   BUN 116* 09/24/2014 0352   CREATININE 1.76* 09/24/2014 0352   CALCIUM 8.1* 09/24/2014 0352   GFRNONAA 25* 09/24/2014 0352   GFRAA 29* 09/24/2014 0352    CMP     Component Value Date/Time   NA 165* 09/24/2014 1013   K 3.5 09/24/2014 0352   CL >130* 09/24/2014 0352   CO2 25 09/24/2014 0352   GLUCOSE 133* 09/24/2014 0352   BUN 116* 09/24/2014 0352   CREATININE 1.76* 09/24/2014 0352   CALCIUM 8.1* 09/24/2014 0352   PROT 6.4 09/24/2014 0352   ALBUMIN 1.7* 09/24/2014 0352   AST 44* 09/24/2014 0352   ALT 21  09/24/2014 0352   ALKPHOS 88 09/24/2014 0352   BILITOT 0.4 09/24/2014 0352   GFRNONAA 25* 09/24/2014 0352   GFRAA 29* 09/24/2014 0352     Time In Time Out Total Time Spent with Patient Total Overall Time  1410 1530 38mn 860m    Greater than 50%  of this time was spent counseling and coordinating care related to the above assessment and plan.  AlVinie SillNP Palliative Medicine Team Pager # [phone]-F 8a-5p) Team Phone # [phone]/Weekends)

## 2014-09-24 NOTE — Progress Notes (Addendum)
[facility] PROGRESS NOTE  Michelle Hooper TAV:[id number] DOB: 09-27-1924 DOA: 09/23/2014 PCP: Benito Mccreedy, MD    Subjective: Patient in bed wearing nonrebreather mask, appears to be slightly short of breath, unresponsive, will not answer questions or follow commands.    Assessment/Plan: 1. Sepsis with suspected aspiration pneumonia. Imaging suggestive of PNA, Nursing staff reports suctioning out tube feed from airway around time of admission Have stopped tube feeds for now except gentle water via PEG,  Pt is continued on vanc and zosyn, Overall prognosis appears extremely poor, will likely pass away in the next few days. Palliative care has been consulted.   2. ARF 1. Due to sepsis and dehydration improving with IV fluids.   3. Hypernatremia 1. To sepsis and dehydration, now developing rales due to third spacing from malnutrition and low albumin levels, we'll have to stop IV fluids, gentle water flushes through PEG tube. Hypernatremia has worsened despite aggressive D5W drip for 24 hours.   4. Dementia 1. Her anti-unresponsive due to sepsis.   5. Multiple pressure ulcers 1. Sacral stage 4, stage 4 at left ankle, right ankle stage 4 2. WOC consulted    6. End of Life - previous MD Dr. Sherrian Divers Met with pt's daughter at bedside to discuss care. Daughter is very much aware of grim prognosis and that her mother may pass at any moment. She is concerned about patient's level of discomfort and wanting "her to be comfortable." Pt's daughter is, however, overwhelmed with the severity of her mother's illness. In fact, she has lost many close family members including both of her daughters in recent years and wishes to continue the plan as outlined above for now. Will have Palliative Care follow up.   DVT prophylaxis. Heparin    Code Status: DNR/DNI Family Communication: Called and left message with patient's daughter on her work number. Cell phone had no answer. Disposition Plan:  pending, will benefit from residential hospice   Consultants:  Palliative Care  Procedures:    Anti-infectives    Start     Dose/Rate Route Frequency Ordered Stop   09/24/14 1800  vancomycin (VANCOCIN) IVPB 750 mg/150 ml premix  Status:  Discontinued     750 mg 150 mL/hr over 60 Minutes Intravenous Every 48 hours 09/30/2014 1832 09/29/2014 2052   09/24/14 1800  vancomycin (VANCOCIN) IVPB 750 mg/150 ml premix     750 mg 150 mL/hr over 60 Minutes Intravenous Every 48 hours 09/23/14 0958     09/23/14 1000  piperacillin-tazobactam (ZOSYN) IVPB 2.25 g     2.25 g 100 mL/hr over 30 Minutes Intravenous Every 6 hours 09/23/14 0957     09/23/14 0000  piperacillin-tazobactam (ZOSYN) IVPB 2.25 g  Status:  Discontinued     2.25 g 100 mL/hr over 30 Minutes Intravenous 4 times per day 10/09/2014 1832 10/03/2014 2052   10/07/2014 1715  vancomycin (VANCOCIN) IVPB 1000 mg/200 mL premix     1,000 mg 200 mL/hr over 60 Minutes Intravenous  Once 09/18/2014 1711 09/24/2014 1835   09/27/2014 1715  piperacillin-tazobactam (ZOSYN) IVPB 3.375 g     3.375 g 100 mL/hr over 30 Minutes Intravenous  Once 10/01/2014 1711 10/08/2014 1834        Objective: Filed Vitals:   09/23/14 1948 09/23/14 2325 09/24/14 0415 09/24/14 0748  BP: 130/55 134/59 119/51 116/58  Pulse: 111 111 109 102  Temp: 100.1 F (37.8 C) 101.1 F (38.4 C) 101.4 F (38.6 C) 98.4 F (36.9 C)  TempSrc: Axillary Axillary Axillary Axillary  Resp: _0 Weight:      SpO2: 100% 96% 96% 100%    Intake/Output Summary (Last 24 hours) at 09/24/14 0953 Last data filed at 09/24/14 0850  Gross per 24 hour  Intake 3019.59 ml  Output   2975 ml  Net  44.59 ml   Filed Weights   09/23/14 0700  Weight: 55 kg (121 lb 4.1 oz)    Exam:   General:  Laying in bed, mild resp distress, not conversant  Cardiovascular: regular, s1, s2  Respiratory: Coarse bilateral breath sounds with bilateral rales  Abdomen: soft,nondistended, PEG tube in  place  Musculoskeletal: perfused, no clubbing , multiple stage IV decubitus ulcers see wound care note for details  Data Reviewed: Basic Metabolic Panel:  Recent Labs Lab 10/04/2014 1707 10/11/2014 2105 09/23/14 0403  09/23/14 1640 09/23/14 1922 09/23/14 2254 09/24/14 0352 09/24/14 0553  NA 154*  --  162*  < > 168* 167* 168* 164* 169*  K 6.3*  --  4.6  --   --   --   --  3.5  --   CL 120*  --  128*  --   --   --   --  >130*  --   CO2 27  --  26  --   --   --   --  25  --   GLUCOSE 156*  --  109*  --   --   --   --  133*  --   BUN 211*  --  179*  --   --   --   --  116*  --   CREATININE 2.77* 2.59* 2.40*  --   --   --   --  1.76*  --   CALCIUM 9.1  --  8.5  --   --   --   --  8.1*  --   < > = values in this interval not displayed. Liver Function Tests:  Recent Labs Lab 10/03/2014 1707 09/23/14 0403 09/24/14 0352  AST 41* 35 44*  ALT _1 ALKPHOS 90 80 88  BILITOT 0.7 QUANTITY NOT SUFFICIENT, UNABLE TO PERFORM TEST 0.4  PROT 7.4 6.0 6.4  ALBUMIN 2.0* 1.6* 1.7*   No results for input(s): LIPASE, AMYLASE in the last 168 hours. No results for input(s): AMMONIA in the last 168 hours. CBC:  Recent Labs Lab 09/21/2014 1707 10/03/2014 2105 09/23/14 0731 09/24/14 0352  WBC 16.3* 12.5* 11.9* 7.5  NEUTROABS 14.6*  --   --  6.5  HGB 8.9* 8.1* 7.8* 7.9*  HCT 29.1* 26.6* 25.5* 26.7*  MCV 79.9 79.9 80.7 79.9  PLT 139* 137* 105* 145*   Cardiac Enzymes: No results for input(s): CKTOTAL, CKMB, CKMBINDEX, TROPONINI in the last 168 hours. BNP (last 3 results) No results for input(s): BNP in the last 8760 hours.  ProBNP (last 3 results) No results for input(s): PROBNP in the last 8760 hours.  CBG:  Recent Labs Lab 10/12/2014 1705 10/11/2014 2049 09/23/14 0800 09/24/14 0746  GLUCAP 117* 143* 112* 145*    Recent Results (from the past 240 hour(s))  Blood Culture (routine x 2)     Status: None (Preliminary result)   Collection Time: 10/07/2014  5:12 PM  Result Value Ref  Range Status   Specimen Description BLOOD ARM LEFT  Final   Special Requests BOTTLES DRAWN AEROBIC AND ANAEROBIC 5CC  Final   Culture   Final  BLOOD CULTURE RECEIVED NO GROWTH TO DATE CULTURE WILL BE HELD FOR 5 DAYS BEFORE ISSUING A FINAL NEGATIVE REPORT Performed at [facility]    Report Status PENDING  Incomplete  Urine culture     Status: None (Preliminary result)   Collection Time: 10/16/2014  5:17 PM  Result Value Ref Range Status   Specimen Description URINE, RANDOM  Final   Special Requests NONE  Final   Colony Count   Final    70,000 COLONIES/ML Performed at [facility]    Culture   Final    [place] Performed at [facility]    Report Status PENDING  Incomplete  Blood Culture (routine x 2)     Status: None (Preliminary result)   Collection Time: 10/15/2014  6:15 PM  Result Value Ref Range Status   Specimen Description BLOOD ARM RIGHT  Final   Special Requests BOTTLES DRAWN AEROBIC ONLY 2CC  Final   Culture   Final           BLOOD CULTURE RECEIVED NO GROWTH TO DATE CULTURE WILL BE HELD FOR 5 DAYS BEFORE ISSUING A FINAL NEGATIVE REPORT Note: Culture results may be compromised due to an inadequate volume of blood received in culture bottles. Performed at [facility]    Report Status PENDING  Incomplete  MRSA PCR Screening     Status: Abnormal   Collection Time: 09/23/14  1:10 AM  Result Value Ref Range Status   MRSA by PCR POSITIVE (A) NEGATIVE Final    Comment:        The GeneXpert MRSA Assay (FDA approved for NASAL specimens only), is one component of a comprehensive MRSA colonization surveillance program. It is not intended to diagnose MRSA infection nor to guide or monitor treatment for MRSA infections. RESULT CALLED TO, READ BACK BY AND VERIFIED WITH: PETTIFORD,A RN [phone] 09/23/14 MITCHELL,L      Studies: Dg Chest Port 1 View  10/01/2014   CLINICAL DATA:  Code sepsis.  History of urinary tract infection.   EXAM: PORTABLE CHEST - 1 VIEW  COMPARISON:  07/05/2014  FINDINGS: Mild cardiac enlargement with borderline pulmonary vascular congestion. Perihilar infiltration greater on the left. This may be due to edema or pneumonia. No blunting of costophrenic angles. No pneumothorax. Calcified aorta. Degenerative changes in the spine and shoulders. Thoracic scoliosis convex towards the right. Surgical clips and gastrostomy type tube in the upper abdomen.  IMPRESSION: Cardiac enlargement with mild pulmonary vascular congestion. Perihilar infiltration could indicate edema or pneumonia.   Electronically Signed   By: Lucienne Capers M.D.   On: 09/21/2014 17:50    Scheduled Meds: . antiseptic oral rinse  7 mL Mouth Rinse q12n4p  . chlorhexidine  15 mL Mouth Rinse BID  . Chlorhexidine Gluconate Cloth  6 each Topical Q0600  . famotidine  20 mg Oral Daily  . ferrous sulfate  300 mg Per Tube BID WC  . folic acid  1 mg Oral Daily  . heparin  5,000 Units Subcutaneous 3 times per day  . multivitamin with minerals  1 tablet Oral Daily  . mupirocin ointment  1 application Nasal BID  . piperacillin-tazobactam (ZOSYN)  IV  2.25 g Intravenous Q6H  . sodium chloride  3 mL Intravenous Q12H  . thiamine  100 mg Oral Daily  . vancomycin  750 mg Intravenous Q48H   Continuous Infusions: . free water      Principal Problem:   Sepsis Active Problems:  HTN (hypertension)   CKD (chronic kidney disease), stage III   Acute hypernatremia   Acute kidney injury   Hyperkalemia    [facility]  [facility] Pager [phone]. If 7PM-7AM, please contact night-coverage at [email or web address], password [facility] 09/24/2014, 9:53 AM  LOS: 2 days

## 2014-09-24 NOTE — Progress Notes (Signed)
Full note to follow:  I met today with Michelle Hooper (although she is unresponsive) and her daughter, Michelle Hooper, at bedside. Michelle Hooper tells me that she hates to see her mother like this and hates to see her suffering. Michelle Hooper tells me about her 2 daughters that have died and she has been through a lot in her life. We discussed that her mother's body is shutting down and she is actively dying. Michelle Hooper acknowledges this and tells me that she knew this and she has watched her decline greatly over ~6 months. However, in her mind she was hoping she would be able to take her back home eventually. She acknowledges that there is "no sense to give medications that are not helping" - referring to antibiotics. With her permission I will add medications for comfort and limit po medications and d/c antibiotics. Communicated plan with nurse and Dr. Candiss Hooper.   Michelle Sill, NP Palliative Medicine Team Pager # [phone] (M-F 8a-5p) Team Phone # [phone] (Nights/Weekends)

## 2014-09-24 NOTE — Progress Notes (Signed)
CRITICAL VALUE ALERT  Critical value received:  Na 168  Date of notification:  09/23/14  Time of notification:  2351  Critical value read back: YES  Nurse who received alert:  Janice NorrieMisty Ennis  MD notified (1st page):  Craige CottaKirby, NP  Time of first page:  0027  MD notified (2nd page):  Time of second page:  Responding MD:   Time MD responded:

## 2014-09-24 NOTE — Clinical Social Work Note (Signed)
CSW received consult for hospice placement- per Palliative physician patient is not appropriate for transfer at this time.  CSW will continue to follow and assist in placement needs if needed.  Merlyn LotJenna Holoman, LCSWA Clinical Social Worker [phone]

## 2014-09-25 MED ORDER — SODIUM CHLORIDE 0.9 % IV SOLN
250.0000 mg | Freq: Two times a day (BID) | INTRAVENOUS | Status: DC
  Filled 2014-09-25 (×2): qty 250

## 2014-09-25 MED ORDER — NITROFURANTOIN 25 MG/5ML PO SUSP
50.0000 mg | Freq: Four times a day (QID) | ORAL | Status: DC
  Filled 2014-09-25 (×4): qty 10

## 2014-09-26 LAB — URINE CULTURE

## 2014-09-29 LAB — CULTURE, BLOOD (ROUTINE X 2)
Culture: NO GROWTH
Culture: NO GROWTH

## 2014-10-17 NOTE — Discharge Summary (Addendum)
[facility] Death Note                                                                                                                                                                                                Death Note please see Last Note for all details.   In breif -    Michelle Hooper CSN:[id number],MRN:[id number] is a 81 y.o. female, Outpatient Primary MD for the patient is [name],[name], MD  Pronounced dead by RN on Oct 05, 2014 @ 13.12pm                  Cause of death Pneumonia, multiple decubitus ulcers, sepsis she was on full comfort care   [name] on 10-05-2014 at 1:59 PM  [facility]  Office Phone [phone]  Total clinical and documentation time for today Under 30 minutes   Last Note    [facility] PROGRESS NOTE  Michelle Hooper XBJ:[id number] DOB: 09-04-1924 DOA: Oct 02, 2014 PCP: Jackie Plum, MD    Subjective: Patient in bed wearing nonrebreather mask, appears to be slightly short of breath, unresponsive, will not answer questions or follow commands.    Assessment/Plan:   She is now full comfort care under palliative care, residential hospice placement will be sought. Prognosis guarded may pass away anytime. All unnecessary medications except comfort medications will be held. I discussed with the daughter in detail about her prognosis. Appreciate palliative care input as well.     Sepsis with suspected aspiration pneumonia. Imaging suggestive of PNA, Nursing staff reports suctioning out tube feed from airway around time of admission Have stopped tube feeds for now except gentle water via PEG, now on comfort measures . Palliative care has been consulted.We will look for residential hospice    Other issues addressed this admission were acute renal failure, hyponatremia, multiple stage IV decubitus ulcers with care with wound care  team, underlying dementia. Chronic dysphagia requiring PEG tube feeds. Note all unnecessary treatments have been stopped now full comfort care. Kindly look at my note from 09/24/2014 for all details.   End of Life - had detailed discussion with patient's daughter over the phone on 09/24/2014, she is DO NOT RESUSCITATE, full comfort care now. Palliative care following. We will look for residential hospice for placement.   DVT prophylaxis. Heparin    Code Status: DNR/DNI Family Communication: Called and left message with patient's daughter on her work number. Cell phone had no answer. Disposition Plan: pending, will benefit from residential hospice   Consultants:  Palliative Care  Procedures:    Anti-infectives    Start   Dose/Rate Route Frequency Ordered Stop  04-Feb-2015 0645  imipenem-cilastatin (PRIMAXIN) 250 mg in sodium chloride 0.9 % 100 mL IVPB Status: Discontinued    250 mg 200 mL/hr over 30 Minutes Intravenous Every 12 hours 04-Feb-2015 0615 04-Feb-2015 1105   09/24/14 1800  vancomycin (VANCOCIN) IVPB 750 mg/150 ml premix Status: Discontinued    750 mg 150 mL/hr over 60 Minutes Intravenous Every 48 hours 10/15/2014 1832 10/07/2014 2052   09/24/14 1800  vancomycin (VANCOCIN) IVPB 750 mg/150 ml premix Status: Discontinued    750 mg 150 mL/hr over 60 Minutes Intravenous Every 48 hours 09/23/14 0958 09/24/14 1530   09/23/14 1000  piperacillin-tazobactam (ZOSYN) IVPB 2.25 g Status: Discontinued    2.25 g 100 mL/hr over 30 Minutes Intravenous Every 6 hours 09/23/14 0957 09/24/14 1530   09/23/14 0000  piperacillin-tazobactam (ZOSYN) IVPB 2.25 g Status: Discontinued    2.25 g 100 mL/hr over 30 Minutes Intravenous 4 times per day 10/16/2014 1832 10/09/2014 2052   10/11/2014 1715  vancomycin (VANCOCIN) IVPB 1000 mg/200 mL premix    1,000 mg 200 mL/hr over 60 Minutes Intravenous Once 09/21/2014 1711 10/16/2014  1835   10/05/2014 1715  piperacillin-tazobactam (ZOSYN) IVPB 3.375 g    3.375 g 100 mL/hr over 30 Minutes Intravenous Once 09/17/2014 1711 09/16/2014 1834       Objective: Filed Vitals:   04-Feb-2015 0000 04-Feb-2015 0400 04-Feb-2015 0807 04-Feb-2015 0900  BP: 103/39 115/47 115/45 115/47  Pulse: 110 119 115 118  Temp:    97.6 F (36.4 C)  TempSrc:    Oral  Resp: 28 27 35 23  Weight:      SpO2: 100% 97% 96% 91%    Intake/Output Summary (Last 24 hours) at 04-Feb-2015 1105 Last data filed at 04-Feb-2015 [id number]  Gross per 24 hour  Intake  0 ml  Output  1400 ml  Net -1400 ml   Filed Weights   09/23/14 0700  Weight: 55 kg (121 lb 4.1 oz)    Exam:   General: Laying in bed, mild resp distress, not conversant  Cardiovascular: regular, s1, s2  Respiratory: Coarse bilateral breath sounds with bilateral rales  Abdomen: soft,nondistended, PEG tube in place  Musculoskeletal: perfused, no clubbing , multiple stage IV decubitus ulcers see wound care note for details  Data Reviewed: Basic Metabolic Panel:  Last Labs      Recent Labs Lab 09/24/2014 1707 10/10/2014 2105 09/23/14 0403  09/23/14 2254 09/24/14 0352 09/24/14 0553 09/24/14 1013 09/24/14 1405  NA 154* --  162* < > 168* 164* 169* 165* 168*  K 6.3* --  4.6 --  --  3.5 --  --  --   CL 120* --  128* --  --  >130* --  --  --   CO2 27 --  26 --  --  25 --  --  --   GLUCOSE 156* --  109* --  --  133* --  --  --   BUN 211* --  179* --  --  116* --  --  --   CREATININE 2.77* 2.59* 2.40* --  --  1.76* --  --  --   CALCIUM 9.1 --  8.5 --  --  8.1* --  --  --   < > = values in this interval not displayed.   Liver Function Tests:  Last Labs      Recent Labs Lab 10/08/2014 1707 09/23/14 0403 09/24/14 0352  AST 41* 35 44*    ALT 20 19 21   ALKPHOS 90 80 88  BILITOT  0.7 QUANTITY NOT SUFFICIENT, UNABLE TO PERFORM TEST 0.4  PROT 7.4 6.0 6.4  ALBUMIN 2.0* 1.6* 1.7*      Last Labs     No results for input(s): LIPASE, AMYLASE in the last 168 hours.    Last Labs     No results for input(s): AMMONIA in the last 168 hours.   CBC:  Last Labs      Recent Labs Lab 10/06/2014 1707 2014/10/06 2105 09/23/14 0731 09/24/14 0352  WBC 16.3* 12.5* 11.9* 7.5  NEUTROABS 14.6* --  --  6.5  HGB 8.9* 8.1* 7.8* 7.9*  HCT 29.1* 26.6* 25.5* 26.7*  MCV 79.9 79.9 80.7 79.9  PLT 139* 137* 105* 145*     Cardiac Enzymes:  Last Labs     No results for input(s): CKTOTAL, CKMB, CKMBINDEX, TROPONINI in the last 168 hours.   BNP (last 3 results)  Recent Labs (within last 365 days)    No results for input(s): BNP in the last 8760 hours.    ProBNP (last 3 results)  Recent Labs (within last 365 days)    No results for input(s): PROBNP in the last 8760 hours.    CBG:  Last Labs      Recent Labs Lab 10-06-14 1705 10-06-14 2049 09/23/14 0800 09/24/14 0746  GLUCAP 117* 143* 112* 145*      Recent Results (from the past 240 hour(s))  Blood Culture (routine x 2) Status: None (Preliminary result)   Collection Time: 10-06-14 5:12 PM  Result Value Ref Range Status   Specimen Description BLOOD ARM LEFT  Final   Special Requests BOTTLES DRAWN AEROBIC AND ANAEROBIC 5CC  Final   Culture   Final     BLOOD CULTURE RECEIVED NO GROWTH TO DATE CULTURE WILL BE HELD FOR 5 DAYS BEFORE ISSUING A FINAL NEGATIVE REPORT Performed at [facility]    Report Status PENDING  Incomplete  Urine culture Status: None (Preliminary result)   Collection Time: 10-06-2014 5:17 PM  Result Value Ref Range Status   Specimen Description URINE, RANDOM  Final   Special Requests NONE  Final   Colony Count    Final    70,000 COLONIES/ML Performed at [facility]    Culture   Final    KLEBSIELLA PNEUMONIAE Note: Confirmed Extended Spectrum Beta-Lactamase Producer (ESBL) CRITICAL RESULT CALLED TO, READ BACK BY AND VERIFIED WITH: MAKALA INGRAM 09/17/2014 AT 0445 RIDK STAPHYLOCOCCUS AUREUS Note: RIFAMPIN AND GENTAMICIN SHOULD NOT BE USED AS SINGLE DRUGS FOR TREATMENT OF STAPH INFECTIONS. Performed at [facility]    Report Status PENDING  Incomplete   Organism ID, Bacteria KLEBSIELLA PNEUMONIAE  Final   Susceptibility   Klebsiella pneumoniae - MIC*    AMPICILLIN >=32 RESISTANT Resistant     CEFAZOLIN >=64 RESISTANT Resistant     CEFTRIAXONE >=64 RESISTANT Resistant     CIPROFLOXACIN >=4 RESISTANT Resistant     GENTAMICIN >=16 RESISTANT Resistant     LEVOFLOXACIN >=8 RESISTANT Resistant     NITROFURANTOIN 32 SENSITIVE Sensitive     TOBRAMYCIN >=16 RESISTANT Resistant     TRIMETH/SULFA >=320 RESISTANT Resistant     IMIPENEM <=0.25 SENSITIVE Sensitive     PIP/TAZO >=128 RESISTANT Resistant    * KLEBSIELLA PNEUMONIAE  Blood Culture (routine x 2) Status: None (Preliminary result)   Collection Time: 10-06-14 6:15 PM  Result Value Ref Range Status   Specimen Description BLOOD ARM RIGHT  Final   Special Requests BOTTLES DRAWN AEROBIC ONLY 2CC  Final  Culture   Final     BLOOD CULTURE RECEIVED NO GROWTH TO DATE CULTURE WILL BE HELD FOR 5 DAYS BEFORE ISSUING A FINAL NEGATIVE REPORT Note: Culture results may be compromised due to an inadequate volume of blood received in culture bottles. Performed at [facility]    Report Status PENDING  Incomplete  MRSA PCR Screening Status: Abnormal   Collection Time: 09/23/14 1:10 AM  Result Value Ref Range Status   MRSA by PCR POSITIVE (A) NEGATIVE Final    Comment:   The  GeneXpert MRSA Assay (FDA approved for NASAL specimens only), is one component of a comprehensive MRSA colonization surveillance program. It is not intended to diagnose MRSA infection nor to guide or monitor treatment for MRSA infections. RESULT CALLED TO, READ BACK BY AND VERIFIED WITH: PETTIFORD,A RN [phone] 09/23/14 MITCHELL,L      Studies:  Imaging Results (Last 48 hours)    Dg Chest Port 1 View  09/24/2014 CLINICAL DATA: Shortness of breath, cough, subsequent encounter. EXAM: PORTABLE CHEST - 1 VIEW COMPARISON: 10/14/2014. FINDINGS: Trachea is midline. Heart size is difficult to assess due to complete opacification of the left hemi thorax. Mild right perihilar opacification. IMPRESSION: 1. New complete opacification of the left hemi thorax may represent a combination of pneumonia and large pleural effusion. Cannot exclude underlying malignancy. Followup to clearing is recommended. 2. Possible developing right perihilar airspace disease. Electronically Signed By: Leanna Battles M.D. On: 09/24/2014 11:05     Scheduled Meds: . antiseptic oral rinse 7 mL Mouth Rinse q12n4p  . chlorhexidine 15 mL Mouth Rinse BID  . Chlorhexidine Gluconate Cloth 6 each Topical Q0600  . famotidine 20 mg Oral Daily  . mupirocin ointment 1 application Nasal BID  . nitrofurantoin 50 mg Oral 4 times per day  . sodium chloride 3 mL Intravenous Q12H   Continuous Infusions:    Principal Problem:  Sepsis Active Problems:  HTN (hypertension)  CKD (chronic kidney disease), stage III  Acute hypernatremia  Acute kidney injury  Hyperkalemia  Palliative care encounter  Dyspnea and respiratory abnormality    [facility] K [facility] Pager [phone]. If 7PM-7AM, please contact night-coverage at [email or web address], password [facility] 10/03/2014, 11:05 AM  LOS: 3 days

## 2014-10-17 NOTE — Progress Notes (Signed)
CRITICAL VALUE ALERT  Critical value received:  Urine culture KLEBSIELLA PNEUMONIAE with Extended Spectrum Beta-Lactamase (ESBL)   Date of notification:  10/02/2014  Time of notification:  0530  Critical value read back: YES  Nurse who received alert:  Janice NorrieMisty Ennis  MD notified (1st page):  Craige CottaKirby, NP  Time of first page:  [phone]  MD notified (2nd page):  Time of second page:  Responding MD:    Time MD responded:

## 2014-10-17 NOTE — Progress Notes (Signed)
[facility] PROGRESS NOTE  Michelle Hooper ZOX:[id number] DOB: 09-14-1924 DOA: 10/01/2014 PCP: Jackie Plum, MD    Subjective: Patient in bed wearing nonrebreather mask, appears to be slightly short of breath, unresponsive, will not answer questions or follow commands.    Assessment/Plan:   She is now full comfort care under palliative care, residential hospice placement will be sought. Prognosis guarded may pass away anytime. All unnecessary medications except comfort medications will be held. I discussed with the daughter in detail about her prognosis. Appreciate palliative care input as well.     Sepsis with suspected aspiration pneumonia. Imaging suggestive of PNA, Nursing staff reports suctioning out tube feed from airway around time of admission Have stopped tube feeds for now except gentle water via PEG,   now on comfort measures . Palliative care has been consulted.We will look for residential hospice    Other issues addressed this admission were acute renal failure, hyponatremia, multiple stage IV decubitus ulcers with care with wound care team, underlying dementia. Chronic dysphagia requiring PEG tube feeds. Note all unnecessary treatments have been stopped now full comfort care. Kindly look at my note from 09/24/2014 for all details.   End of Life - had detailed discussion with patient's daughter over the phone on 09/24/2014, she is DO NOT RESUSCITATE, full comfort care now. Palliative care following. We will look for residential hospice for placement.   DVT prophylaxis. Heparin    Code Status: DNR/DNI Family Communication: Called and left message with patient's daughter on her work number. Cell phone had no answer. Disposition Plan: pending, will benefit from residential hospice   Consultants:  Palliative Care  Procedures:    Anti-infectives    Start     Dose/Rate Route Frequency Ordered Stop   10-13-2014 0645  imipenem-cilastatin (PRIMAXIN) 250 mg in  sodium chloride 0.9 % 100 mL IVPB  Status:  Discontinued     250 mg 200 mL/hr over 30 Minutes Intravenous Every 12 hours 2014/10/13 0615 10-13-14 1105   09/24/14 1800  vancomycin (VANCOCIN) IVPB 750 mg/150 ml premix  Status:  Discontinued     750 mg 150 mL/hr over 60 Minutes Intravenous Every 48 hours 10/14/2014 1832 09/16/2014 2052   09/24/14 1800  vancomycin (VANCOCIN) IVPB 750 mg/150 ml premix  Status:  Discontinued     750 mg 150 mL/hr over 60 Minutes Intravenous Every 48 hours 09/23/14 0958 09/24/14 1530   09/23/14 1000  piperacillin-tazobactam (ZOSYN) IVPB 2.25 g  Status:  Discontinued     2.25 g 100 mL/hr over 30 Minutes Intravenous Every 6 hours 09/23/14 0957 09/24/14 1530   09/23/14 0000  piperacillin-tazobactam (ZOSYN) IVPB 2.25 g  Status:  Discontinued     2.25 g 100 mL/hr over 30 Minutes Intravenous 4 times per day 09/16/2014 1832 09/27/2014 2052   09/17/2014 1715  vancomycin (VANCOCIN) IVPB 1000 mg/200 mL premix     1,000 mg 200 mL/hr over 60 Minutes Intravenous  Once 10/13/2014 1711 09/24/2014 1835   10/03/2014 1715  piperacillin-tazobactam (ZOSYN) IVPB 3.375 g     3.375 g 100 mL/hr over 30 Minutes Intravenous  Once 09/20/2014 1711 10/15/2014 1834        Objective: Filed Vitals:   10-13-14 0000 10/13/14 0400 10/13/2014 0807 Oct 13, 2014 0900  BP: 103/39 115/47 115/45 115/47  Pulse: 110 119 115 118  Temp:    97.6 F (36.4 C)  TempSrc:    Oral  Resp: 28 27 35 23  Weight:      SpO2: 100% 97% 96% 91%  Intake/Output Summary (Last 24 hours) at 2014-10-25 1105 Last data filed at 2014-10-25 0634  Gross per 24 hour  Intake      0 ml  Output   1400 ml  Net  -1400 ml   Filed Weights   09/23/14 0700  Weight: 55 kg (121 lb 4.1 oz)    Exam:   General:  Laying in bed, mild resp distress, not conversant  Cardiovascular: regular, s1, s2  Respiratory: Coarse bilateral breath sounds with bilateral rales  Abdomen: soft,nondistended, PEG tube in place  Musculoskeletal: perfused, no clubbing ,  multiple stage IV decubitus ulcers see wound care note for details  Data Reviewed: Basic Metabolic Panel:  Recent Labs Lab 09/27/2014 1707 10/08/2014 2105 09/23/14 0403  09/23/14 2254 09/24/14 0352 09/24/14 0553 09/24/14 1013 09/24/14 1405  NA 154*  --  162*  < > 168* 164* 169* 165* 168*  K 6.3*  --  4.6  --   --  3.5  --   --   --   CL 120*  --  128*  --   --  >130*  --   --   --   CO2 27  --  26  --   --  25  --   --   --   GLUCOSE 156*  --  109*  --   --  133*  --   --   --   BUN 211*  --  179*  --   --  116*  --   --   --   CREATININE 2.77* 2.59* 2.40*  --   --  1.76*  --   --   --   CALCIUM 9.1  --  8.5  --   --  8.1*  --   --   --   < > = values in this interval not displayed. Liver Function Tests:  Recent Labs Lab 10/05/2014 1707 09/23/14 0403 09/24/14 0352  AST 41* 35 44*  ALT 20 19 21   ALKPHOS 90 80 88  BILITOT 0.7 QUANTITY NOT SUFFICIENT, UNABLE TO PERFORM TEST 0.4  PROT 7.4 6.0 6.4  ALBUMIN 2.0* 1.6* 1.7*   No results for input(s): LIPASE, AMYLASE in the last 168 hours. No results for input(s): AMMONIA in the last 168 hours. CBC:  Recent Labs Lab 09/27/2014 1707 09/23/2014 2105 09/23/14 0731 09/24/14 0352  WBC 16.3* 12.5* 11.9* 7.5  NEUTROABS 14.6*  --   --  6.5  HGB 8.9* 8.1* 7.8* 7.9*  HCT 29.1* 26.6* 25.5* 26.7*  MCV 79.9 79.9 80.7 79.9  PLT 139* 137* 105* 145*   Cardiac Enzymes: No results for input(s): CKTOTAL, CKMB, CKMBINDEX, TROPONINI in the last 168 hours. BNP (last 3 results) No results for input(s): BNP in the last 8760 hours.  ProBNP (last 3 results) No results for input(s): PROBNP in the last 8760 hours.  CBG:  Recent Labs Lab 09/24/2014 1705 09/24/2014 2049 09/23/14 0800 09/24/14 0746  GLUCAP 117* 143* 112* 145*    Recent Results (from the past 240 hour(s))  Blood Culture (routine x 2)     Status: None (Preliminary result)   Collection Time: 09/18/2014  5:12 PM  Result Value Ref Range Status   Specimen Description BLOOD ARM LEFT   Final   Special Requests BOTTLES DRAWN AEROBIC AND ANAEROBIC 5CC  Final   Culture   Final           BLOOD CULTURE RECEIVED NO GROWTH TO DATE CULTURE WILL BE HELD FOR  5 DAYS BEFORE ISSUING A FINAL NEGATIVE REPORT [facility]formed [facility]    Report Status PENDING  Incomplete  Urine culture     Status: None (Preliminary result)   Collection Time: 10/01/2014  5:17 PM  Result Value Ref Range Status   Specimen Description URINE, RANDOM  Final   Special Requests NONE  Final   Colony Count   Final    70,000 COLONIES/ML Performed at [facility]    Culture   Final    KLEBSIELLA PNEUMONIAE Note: Confirmed Extended Spectrum Beta-Lactamase Producer (ESBL) CRITICAL RESULT CALLED TO, READ BACK BY AND VERIFIED WITH: MAKALA INGRAM Sep 10, 2014 AT 0445 RIDK STAPHYLOCOCCUS AUREUS Note: RIFAMPIN AND GENTAMICIN SHOULD NOT BE USED AS SINGLE DRUGS FOR TREATMENT OF STAPH INFECTIONS. Performed at [facility]    Report Status PENDING  Incomplete   Organism ID, Bacteria KLEBSIELLA PNEUMONIAE  Final      Susceptibility   Klebsiella pneumoniae - MIC*    AMPICILLIN >=32 RESISTANT Resistant     CEFAZOLIN >=64 RESISTANT Resistant     CEFTRIAXONE >=64 RESISTANT Resistant     CIPROFLOXACIN >=4 RESISTANT Resistant     GENTAMICIN >=16 RESISTANT Resistant     LEVOFLOXACIN >=8 RESISTANT Resistant     NITROFURANTOIN 32 SENSITIVE Sensitive     TOBRAMYCIN >=16 RESISTANT Resistant     TRIMETH/SULFA >=320 RESISTANT Resistant     IMIPENEM <=0.25 SENSITIVE Sensitive     PIP/TAZO >=128 RESISTANT Resistant     * KLEBSIELLA PNEUMONIAE  Blood Culture (routine x 2)     Status: None (Preliminary result)   Collection Time: 09/23/2014  6:15 PM  Result Value Ref Range Status   Specimen Description BLOOD ARM RIGHT  Final   Special Requests BOTTLES DRAWN AEROBIC ONLY 2CC  Final   Culture   Final           BLOOD CULTURE RECEIVED NO GROWTH TO DATE CULTURE WILL BE HELD FOR 5 DAYS BEFORE ISSUING A FINAL NEGATIVE  REPORT Note: Culture results may be compromised due to an inadequate volume of blood received in culture bottles. Performed at [facility]    Report Status PENDING  Incomplete  MRSA PCR Screening     Status: Abnormal   Collection Time: 09/23/14  1:10 AM  Result Value Ref Range Status   MRSA by PCR POSITIVE (A) NEGATIVE Final    Comment:        The GeneXpert MRSA Assay (FDA approved for NASAL specimens only), is one component of a comprehensive MRSA colonization surveillance program. It is not intended to diagnose MRSA infection nor to guide or monitor treatment for MRSA infections. RESULT CALLED TO, READ BACK BY AND VERIFIED WITH: PETTIFORD,A RN [phone] 09/23/14 MITCHELL,L      Studies: Dg Chest Port 1 View  09/24/2014   CLINICAL DATA:  Shortness of breath, cough, subsequent encounter.  EXAM: PORTABLE CHEST - 1 VIEW  COMPARISON:  10/09/2014.  FINDINGS: Trachea is midline. Heart size is difficult to assess due to complete opacification of the left hemi thorax. Mild right perihilar opacification.  IMPRESSION: 1. New complete opacification of the left hemi thorax may represent a combination of pneumonia and large pleural effusion. Cannot exclude underlying malignancy. Followup to clearing is recommended. 2. Possible developing right perihilar airspace disease.   Electronically Signed   By: Leanna BattlesMelinda  Blietz M.D.   On: 09/24/2014 11:05    Scheduled Meds: . antiseptic oral rinse  7 mL Mouth Rinse q12n4p  . chlorhexidine  15 mL Mouth Rinse BID  . Chlorhexidine Gluconate Cloth  6 each Topical Q0600  . famotidine  20 mg Oral Daily  . mupirocin ointment  1 application Nasal BID  . nitrofurantoin  50 mg Oral 4 times per day  . sodium chloride  3 mL Intravenous Q12H   Continuous Infusions:    Principal Problem:   Sepsis Active Problems:   HTN (hypertension)   CKD (chronic kidney disease), stage III   Acute hypernatremia   Acute kidney injury   Hyperkalemia   Palliative care  encounter   Dyspnea and respiratory abnormality    [facility] K  [facility] Pager [phone]. If 7PM-7AM, please contact night-coverage at [email or web address], password [facility] 10/12/2014, 11:05 AM  LOS: 3 days

## 2014-10-17 NOTE — Care Management Note (Signed)
    Page 1 of 1   09/27/2014     4:35:10 PM CARE MANAGEMENT NOTE 10/08/2014  Patient:  Michelle Hooper,Michelle Hooper   Account Number:  [id number]  Date Initiated:  09/18/2014  Documentation initiated by:  Letha CapeAYLOR,Bricelyn Freestone  Subjective/Objective Assessment:   pt transferred to floor and expired.     Action/Plan:   Anticipated DC Date:  09/29/2014   Anticipated DC Plan:  EXPIRED         Choice offered to / List presented to:             Status of service:  Completed, signed off Medicare Important Message given?   (If response is "NO", the following Medicare IM given date fields will be blank) Date Medicare IM given:   Medicare IM given by:   Date Additional Medicare IM given:   Additional Medicare IM given by:    Discharge Disposition:  EXPIRED  Per UR Regulation:  Reviewed for med. necessity/level of care/duration of stay  If discussed at Long Length of Stay Meetings, dates discussed:    Comments:

## 2014-10-17 NOTE — Progress Notes (Signed)
Daughter came to visit and was informed of her mother's death. Doyne KeelParsons, [place] L 4:03 PM

## 2014-10-17 NOTE — Progress Notes (Signed)
Nutrition Brief Note  Chart reviewed. Pt now transitioning to comfort care.  No further nutrition interventions warranted at this time.  Please re-consult as needed.   Valente Fosberg A. Jayce Kainz, RD, LDN, CDE Pager: [phone] After hours Pager: [phone]  

## 2014-10-17 NOTE — Progress Notes (Signed)
ANTIBIOTIC CONSULT NOTE -  Pharmacy Consult for Primaxin Indication: urosepsis  No Known Allergies  Patient Measurements: Weight: 121 lb 4.1 oz (55 kg) Adjusted Body Weight:   Vital Signs: Temp: 99.4 F (37.4 C) (03/09 2155) Temp Source: Oral (03/09 2155) BP: 115/47 mmHg (03/10 0400) Pulse Rate: 119 (03/10 0400) Intake/Output from previous day: 03/09 0701 - 03/10 0700 In: 407.2 [I.V.:357.2; IV Piggyback:50] Out: 1050 [Urine:1050] Intake/Output from this shift:    Labs:  Recent Labs  2014/09/30 2105 09/23/14 0403 09/23/14 0731 09/24/14 0352  WBC 12.5*  --  11.9* 7.5  HGB 8.1*  --  7.8* 7.9*  PLT 137*  --  105* 145*  CREATININE 2.59* 2.40*  --  1.76*   Estimated Creatinine Clearance: 16.7 mL/min (by C-G formula based on Cr of 1.76). No results for input(s): VANCOTROUGH, VANCOPEAK, VANCORANDOM, GENTTROUGH, GENTPEAK, GENTRANDOM, TOBRATROUGH, TOBRAPEAK, TOBRARND, AMIKACINPEAK, AMIKACINTROU, AMIKACIN in the last 72 hours.   Microbiology: Recent Results (from the past 720 hour(s))  Blood Culture (routine x 2)     Status: None (Preliminary result)   Collection Time: 2014/09/30  5:12 PM  Result Value Ref Range Status   Specimen Description BLOOD ARM LEFT  Final   Special Requests BOTTLES DRAWN AEROBIC AND ANAEROBIC 5CC  Final   Culture   Final           BLOOD CULTURE RECEIVED NO GROWTH TO DATE CULTURE WILL BE HELD FOR 5 DAYS BEFORE ISSUING A FINAL NEGATIVE REPORT Performed at [facility]    Report Status PENDING  Incomplete  Urine culture     Status: None (Preliminary result)   Collection Time: 2014/09/30  5:17 PM  Result Value Ref Range Status   Specimen Description URINE, RANDOM  Final   Special Requests NONE  Final   Colony Count   Final    70,000 COLONIES/ML Performed at [facility]    Culture   Final    KLEBSIELLA PNEUMONIAE Note: Confirmed Extended Spectrum Beta-Lactamase Producer (ESBL) CRITICAL RESULT CALLED TO, READ BACK BY AND VERIFIED WITH:  MAKALA INGRAM 09/23/2014 AT 0445 RIDK STAPHYLOCOCCUS AUREUS Note: RIFAMPIN AND GENTAMICIN SHOULD NOT BE USED AS SINGLE DRUGS FOR TREATMENT OF STAPH INFECTIONS. Performed at [facility]    Report Status PENDING  Incomplete   Organism ID, Bacteria KLEBSIELLA PNEUMONIAE  Final      Susceptibility   Klebsiella pneumoniae - MIC*    AMPICILLIN >=32 RESISTANT Resistant     CEFAZOLIN >=64 RESISTANT Resistant     CEFTRIAXONE >=64 RESISTANT Resistant     CIPROFLOXACIN >=4 RESISTANT Resistant     GENTAMICIN >=16 RESISTANT Resistant     LEVOFLOXACIN >=8 RESISTANT Resistant     NITROFURANTOIN 32 SENSITIVE Sensitive     TOBRAMYCIN >=16 RESISTANT Resistant     TRIMETH/SULFA >=320 RESISTANT Resistant     IMIPENEM <=0.25 SENSITIVE Sensitive     PIP/TAZO >=128 RESISTANT Resistant     * KLEBSIELLA PNEUMONIAE  Blood Culture (routine x 2)     Status: None (Preliminary result)   Collection Time: 2014/09/30  6:15 PM  Result Value Ref Range Status   Specimen Description BLOOD ARM RIGHT  Final   Special Requests BOTTLES DRAWN AEROBIC ONLY 2CC  Final   Culture   Final           BLOOD CULTURE RECEIVED NO GROWTH TO DATE CULTURE WILL BE HELD FOR 5 DAYS BEFORE ISSUING A FINAL NEGATIVE REPORT Note: Culture results may be compromised due to an inadequate  volume of blood received in culture bottles. Performed at [facility]    Report Status PENDING  Incomplete  MRSA PCR Screening     Status: Abnormal   Collection Time: 09/23/14  1:10 AM  Result Value Ref Range Status   MRSA by PCR POSITIVE (A) NEGATIVE Final    Comment:        The GeneXpert MRSA Assay (FDA approved for NASAL specimens only), is one component of a comprehensive MRSA colonization surveillance program. It is not intended to diagnose MRSA infection nor to guide or monitor treatment for MRSA infections. RESULT CALLED TO, READ BACK BY AND VERIFIED WITH: PETTIFORD,A RN [phone] 09/23/14 MITCHELL,L    Assessment: 81 yo female with  urosepsis, urine culture growing ESBL Klebsiella, for Primaxin  Plan:  Primaxin 250 mg IV q12h  Eddie Candle 10/02/2014,6:07 AM

## 2014-10-17 NOTE — Progress Notes (Signed)
Faylene KurtzGeneva Hooper is a 81 y.o. female patient who transferred  from 2C04. Disoriented, non verbal, responds only to pain. DNR, VSS - Blood pressure 115/47, pulse 118, temperature 97.6 F (36.4 C), temperature source axillary, resp. rate 23, weight 55 kg (121 lb 4.1 oz), SpO2 91 %., O2   @ 8 L nonrebreather, no c/o shortness of breath, no  chest pain, no distress noted. No tele   IV site WDL: R forearm with a transparent dsg that's clean dry and intact.  Allergies:  No Known Allergies   Past Medical History  Diagnosis Date  . SBO (small bowel obstruction)   . Hypertension   . Alzheimer's dementia   . Iron deficiency anemia   . Frequent UTI      SR up x 3,  Skin, clean-dry- intact without evidence of bruising, or skin tears.   Stage IV pressure ulcers noted on R lateral aspect of foot, L lateral  aspect of foot, L shin area and sacrum. Dressed with foam dressings. Dressings clean dry & intact.    Comfort care measures in place. Will cont to monitor and assist as needed.  Sandrea HammondJunris Chloris Marcoux RN 09/23/2014 9:12 AM

## 2014-10-17 NOTE — Progress Notes (Signed)
Attempted to call daughter at work and on cell phone. Messages left. Doyne KeelParsons, Kaylamarie Swickard L 1:16 PM

## 2014-10-17 NOTE — Progress Notes (Signed)
Pt was found breathless & pulseless at 1305. Verified by two RNs, Delcine Johnson. MD notified with returned call. Palliative care notified as well. Family will be notified. Doyne KeelParsons, Juliett Eastburn L 1:12 PM

## 2014-10-17 DEATH — deceased
